# Patient Record
Sex: Male | Born: 1935 | Race: White | Hispanic: No | Marital: Married | Smoking: Former smoker
Health system: Southern US, Community
[De-identification: ages and names within clinical notes are randomized; demographics above are authoritative.]

## PROBLEM LIST (undated history)

## (undated) DIAGNOSIS — I509 Heart failure, unspecified: Secondary | ICD-10-CM

## (undated) DIAGNOSIS — J449 Chronic obstructive pulmonary disease, unspecified: Secondary | ICD-10-CM

## (undated) DIAGNOSIS — E7800 Pure hypercholesterolemia, unspecified: Secondary | ICD-10-CM

## (undated) DIAGNOSIS — Z87898 Personal history of other specified conditions: Secondary | ICD-10-CM

## (undated) DIAGNOSIS — I10 Essential (primary) hypertension: Secondary | ICD-10-CM

## (undated) DIAGNOSIS — E039 Hypothyroidism, unspecified: Secondary | ICD-10-CM

## (undated) DIAGNOSIS — M1990 Unspecified osteoarthritis, unspecified site: Secondary | ICD-10-CM

## (undated) DIAGNOSIS — R600 Localized edema: Secondary | ICD-10-CM

## (undated) DIAGNOSIS — Z9889 Other specified postprocedural states: Secondary | ICD-10-CM

## (undated) DIAGNOSIS — R002 Palpitations: Secondary | ICD-10-CM

## (undated) DIAGNOSIS — Z889 Allergy status to unspecified drugs, medicaments and biological substances status: Secondary | ICD-10-CM

## (undated) DIAGNOSIS — J189 Pneumonia, unspecified organism: Secondary | ICD-10-CM

## (undated) DIAGNOSIS — I739 Peripheral vascular disease, unspecified: Secondary | ICD-10-CM

## (undated) DIAGNOSIS — H9190 Unspecified hearing loss, unspecified ear: Secondary | ICD-10-CM

## (undated) DIAGNOSIS — J45909 Unspecified asthma, uncomplicated: Secondary | ICD-10-CM

## (undated) DIAGNOSIS — G4730 Sleep apnea, unspecified: Secondary | ICD-10-CM

## (undated) DIAGNOSIS — Z8639 Personal history of other endocrine, nutritional and metabolic disease: Secondary | ICD-10-CM

## (undated) DIAGNOSIS — K219 Gastro-esophageal reflux disease without esophagitis: Secondary | ICD-10-CM

## (undated) DIAGNOSIS — R112 Nausea with vomiting, unspecified: Secondary | ICD-10-CM

## (undated) DIAGNOSIS — E119 Type 2 diabetes mellitus without complications: Secondary | ICD-10-CM

## (undated) DIAGNOSIS — IMO0001 Reserved for inherently not codable concepts without codable children: Secondary | ICD-10-CM

## (undated) HISTORY — PX: TONSILLECTOMY: SUR1361

## (undated) HISTORY — PX: OTHER SURGICAL HISTORY: SHX169

## (undated) HISTORY — PX: CARDIAC CATHETERIZATION: SHX172

## (undated) HISTORY — PX: CHOLECYSTECTOMY: SHX55

## (undated) HISTORY — PX: EYE SURGERY: SHX253

---

## 1997-07-08 ENCOUNTER — Encounter: Payer: Self-pay | Admitting: Family Medicine

## 1997-07-08 LAB — CONVERTED CEMR LAB: PSA: 0.5 ng/mL

## 1997-07-16 ENCOUNTER — Encounter: Payer: Self-pay | Admitting: Family Medicine

## 1997-07-16 LAB — CONVERTED CEMR LAB: PSA: 0.5 ng/mL

## 1998-07-08 ENCOUNTER — Encounter: Payer: Self-pay | Admitting: Family Medicine

## 1998-07-08 LAB — CONVERTED CEMR LAB: PSA: 0.5 ng/mL

## 1998-07-22 ENCOUNTER — Encounter: Payer: Self-pay | Admitting: Family Medicine

## 1998-07-22 LAB — CONVERTED CEMR LAB: PSA: 0.5 ng/mL

## 1999-01-09 ENCOUNTER — Encounter: Payer: Self-pay | Admitting: Family Medicine

## 1999-01-09 LAB — CONVERTED CEMR LAB
Hgb A1c MFr Bld: 5.5 %
Microalbumin U total vol: 30 mg/L

## 1999-12-06 ENCOUNTER — Encounter: Payer: Self-pay | Admitting: Family Medicine

## 1999-12-06 LAB — CONVERTED CEMR LAB: PSA: 0.5 ng/mL

## 1999-12-21 ENCOUNTER — Encounter: Payer: Self-pay | Admitting: Family Medicine

## 1999-12-23 ENCOUNTER — Encounter: Payer: Self-pay | Admitting: Family Medicine

## 1999-12-23 LAB — CONVERTED CEMR LAB
Blood Glucose, Fasting: 117 mg/dL
Microalbumin U total vol: 19.1 mg/L
TSH: 1.08 microintl units/mL

## 2000-01-17 ENCOUNTER — Encounter: Payer: Self-pay | Admitting: Emergency Medicine

## 2000-01-17 ENCOUNTER — Emergency Department (HOSPITAL_COMMUNITY): Admission: EM | Admit: 2000-01-17 | Discharge: 2000-01-17 | Payer: Self-pay | Admitting: Emergency Medicine

## 2000-06-28 ENCOUNTER — Encounter: Payer: Self-pay | Admitting: Family Medicine

## 2000-06-28 LAB — CONVERTED CEMR LAB: Hgb A1c MFr Bld: 5.6 %

## 2001-04-07 ENCOUNTER — Encounter: Payer: Self-pay | Admitting: Family Medicine

## 2001-04-07 LAB — CONVERTED CEMR LAB: PSA: 0.5 ng/mL

## 2001-05-01 ENCOUNTER — Encounter: Payer: Self-pay | Admitting: Family Medicine

## 2001-05-01 LAB — CONVERTED CEMR LAB
Blood Glucose, Fasting: 146 mg/dL
TSH: 0.817 microintl units/mL

## 2001-05-02 ENCOUNTER — Encounter: Payer: Self-pay | Admitting: Family Medicine

## 2001-05-02 LAB — CONVERTED CEMR LAB
Hgb A1c MFr Bld: 6 %
PSA: 0.5 ng/mL

## 2001-08-01 ENCOUNTER — Encounter: Payer: Self-pay | Admitting: Family Medicine

## 2001-12-15 ENCOUNTER — Encounter: Payer: Self-pay | Admitting: Family Medicine

## 2002-05-14 ENCOUNTER — Encounter: Payer: Self-pay | Admitting: Family Medicine

## 2002-05-14 LAB — CONVERTED CEMR LAB: Hgb A1c MFr Bld: 7.1 %

## 2002-05-16 ENCOUNTER — Encounter: Payer: Self-pay | Admitting: Family Medicine

## 2002-05-16 LAB — CONVERTED CEMR LAB
PSA: 0.4 ng/mL
PSA: 0.4 ng/mL

## 2002-08-16 ENCOUNTER — Encounter: Payer: Self-pay | Admitting: Family Medicine

## 2003-08-06 ENCOUNTER — Encounter: Payer: Self-pay | Admitting: Family Medicine

## 2003-08-06 LAB — CONVERTED CEMR LAB: PSA: 0.5 ng/mL

## 2003-08-19 ENCOUNTER — Encounter: Payer: Self-pay | Admitting: Family Medicine

## 2003-08-19 LAB — CONVERTED CEMR LAB
Blood Glucose, Fasting: 158 mg/dL
Microalbumin U total vol: 12.7 mg/L
TSH: 0.472 microintl units/mL

## 2003-08-21 ENCOUNTER — Encounter: Payer: Self-pay | Admitting: Family Medicine

## 2003-08-21 LAB — CONVERTED CEMR LAB: PSA: 0.5 ng/mL

## 2003-11-18 ENCOUNTER — Encounter: Payer: Self-pay | Admitting: Family Medicine

## 2003-11-18 LAB — CONVERTED CEMR LAB: Hgb A1c MFr Bld: 7 %

## 2004-04-17 ENCOUNTER — Encounter: Payer: Self-pay | Admitting: Family Medicine

## 2004-04-17 LAB — CONVERTED CEMR LAB: Hgb A1c MFr Bld: 7 %

## 2004-05-21 ENCOUNTER — Ambulatory Visit: Payer: Self-pay | Admitting: Family Medicine

## 2004-06-22 ENCOUNTER — Ambulatory Visit: Payer: Self-pay | Admitting: Family Medicine

## 2004-07-15 ENCOUNTER — Encounter: Payer: Self-pay | Admitting: Family Medicine

## 2004-07-15 LAB — CONVERTED CEMR LAB
Blood Glucose, Fasting: 134 mg/dL
Hgb A1c MFr Bld: 6.9 %

## 2004-08-17 ENCOUNTER — Ambulatory Visit: Payer: Self-pay | Admitting: Family Medicine

## 2004-09-22 ENCOUNTER — Ambulatory Visit: Payer: Self-pay | Admitting: Family Medicine

## 2004-10-23 ENCOUNTER — Ambulatory Visit: Payer: Self-pay | Admitting: Family Medicine

## 2005-06-03 ENCOUNTER — Ambulatory Visit: Payer: Self-pay | Admitting: Family Medicine

## 2005-06-04 ENCOUNTER — Ambulatory Visit: Payer: Self-pay | Admitting: Family Medicine

## 2005-06-09 ENCOUNTER — Encounter: Payer: Self-pay | Admitting: Family Medicine

## 2005-06-11 ENCOUNTER — Ambulatory Visit: Payer: Self-pay | Admitting: Family Medicine

## 2005-07-14 ENCOUNTER — Ambulatory Visit: Payer: Self-pay | Admitting: Family Medicine

## 2005-07-19 ENCOUNTER — Ambulatory Visit: Payer: Self-pay | Admitting: Family Medicine

## 2005-09-10 ENCOUNTER — Encounter: Payer: Self-pay | Admitting: Family Medicine

## 2005-09-10 LAB — CONVERTED CEMR LAB: Blood Glucose, Fasting: 143 mg/dL

## 2005-09-15 ENCOUNTER — Ambulatory Visit: Payer: Self-pay | Admitting: Family Medicine

## 2006-02-08 ENCOUNTER — Encounter: Payer: Self-pay | Admitting: Family Medicine

## 2006-02-08 LAB — CONVERTED CEMR LAB: Hgb A1c MFr Bld: 6.8 %

## 2006-03-21 ENCOUNTER — Encounter: Payer: Self-pay | Admitting: Family Medicine

## 2006-03-21 LAB — CONVERTED CEMR LAB
Blood Glucose, Fasting: 109 mg/dL
Microalbumin U total vol: 3.5 mg/L
PSA: 0.6 ng/mL

## 2006-03-22 ENCOUNTER — Encounter: Payer: Self-pay | Admitting: Family Medicine

## 2007-03-09 ENCOUNTER — Encounter: Payer: Self-pay | Admitting: Family Medicine

## 2007-03-09 DIAGNOSIS — I10 Essential (primary) hypertension: Secondary | ICD-10-CM | POA: Insufficient documentation

## 2007-03-09 DIAGNOSIS — E119 Type 2 diabetes mellitus without complications: Secondary | ICD-10-CM

## 2007-03-09 DIAGNOSIS — N400 Enlarged prostate without lower urinary tract symptoms: Secondary | ICD-10-CM

## 2007-03-09 DIAGNOSIS — E039 Hypothyroidism, unspecified: Secondary | ICD-10-CM | POA: Insufficient documentation

## 2007-03-09 DIAGNOSIS — E785 Hyperlipidemia, unspecified: Secondary | ICD-10-CM

## 2007-03-12 DIAGNOSIS — G609 Hereditary and idiopathic neuropathy, unspecified: Secondary | ICD-10-CM | POA: Insufficient documentation

## 2007-03-12 DIAGNOSIS — Z87891 Personal history of nicotine dependence: Secondary | ICD-10-CM

## 2007-03-12 DIAGNOSIS — N4889 Other specified disorders of penis: Secondary | ICD-10-CM | POA: Insufficient documentation

## 2007-03-12 DIAGNOSIS — E871 Hypo-osmolality and hyponatremia: Secondary | ICD-10-CM | POA: Insufficient documentation

## 2007-06-15 ENCOUNTER — Telehealth (INDEPENDENT_AMBULATORY_CARE_PROVIDER_SITE_OTHER): Payer: Self-pay | Admitting: *Deleted

## 2007-07-31 ENCOUNTER — Encounter: Payer: Self-pay | Admitting: Family Medicine

## 2008-01-05 ENCOUNTER — Ambulatory Visit: Payer: Self-pay | Admitting: Internal Medicine

## 2008-05-20 ENCOUNTER — Encounter: Payer: Self-pay | Admitting: Internal Medicine

## 2008-06-07 ENCOUNTER — Encounter: Payer: Self-pay | Admitting: Internal Medicine

## 2008-07-10 ENCOUNTER — Ambulatory Visit: Payer: Self-pay | Admitting: Endocrinology

## 2008-07-24 ENCOUNTER — Encounter: Payer: Self-pay | Admitting: Internal Medicine

## 2008-08-05 ENCOUNTER — Encounter: Payer: Self-pay | Admitting: Internal Medicine

## 2009-09-29 ENCOUNTER — Ambulatory Visit: Payer: Self-pay | Admitting: Endocrinology

## 2010-05-06 ENCOUNTER — Ambulatory Visit: Payer: Self-pay | Admitting: Endocrinology

## 2011-05-13 ENCOUNTER — Ambulatory Visit: Payer: Self-pay | Admitting: General Surgery

## 2011-05-19 ENCOUNTER — Ambulatory Visit: Payer: Self-pay | Admitting: General Surgery

## 2012-11-11 IMAGING — CR DG CHOLANGIOGRAM OPERATIVE
1 series · 1 of 1 positions shown · non-contrast
Comparison: none

REASON FOR EXAM: cholelithiasis
COMMENTS:

[[person_name]]
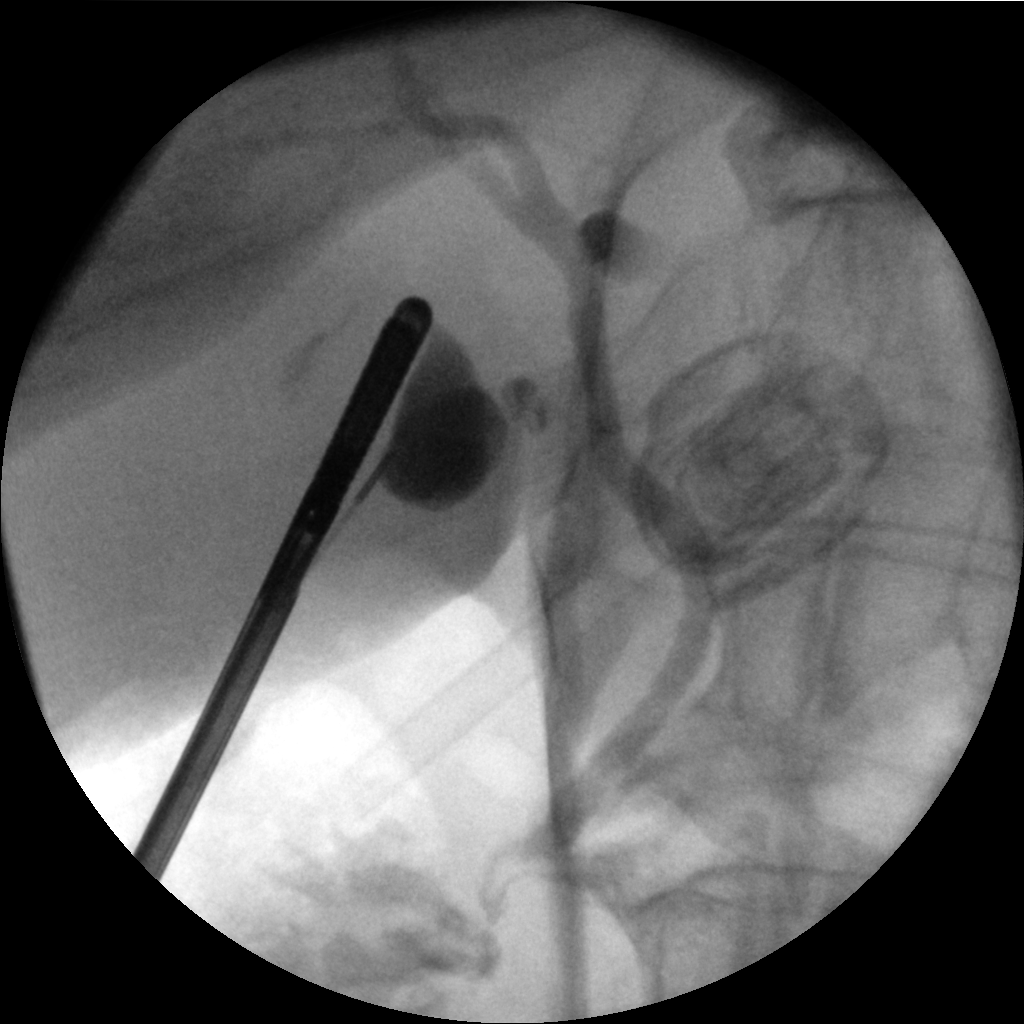

[1 of 1 positions shown; findings below may reference images not displayed]

PROCEDURE:     DXR - DXR CHOLANGIOGRAM OP (INITIAL)  - May 19, 2011  [DATE]

RESULT:

Contrast is appreciated within the common bile duct as well as within the
gallbladder. There is no evidence of contrast extravasation. Contrast
drainage into the duodenum is also identified. There does not appear to be
gross evidence of filling defects.
IMPRESSION: Intra-Operative Cholangiogram as described above.

The remainder of the interpretation will be left to the performing physician.

## 2013-08-01 ENCOUNTER — Emergency Department: Payer: Self-pay | Admitting: Emergency Medicine

## 2013-08-01 LAB — COMPREHENSIVE METABOLIC PANEL
ALBUMIN: 3.8 g/dL (ref 3.4–5.0)
ALK PHOS: 76 U/L
ALT: 17 U/L (ref 12–78)
AST: 15 U/L (ref 15–37)
Anion Gap: 3 — ABNORMAL LOW (ref 7–16)
BUN: 8 mg/dL (ref 7–18)
Bilirubin,Total: 0.7 mg/dL (ref 0.2–1.0)
CO2: 30 mmol/L (ref 21–32)
Calcium, Total: 8.3 mg/dL — ABNORMAL LOW (ref 8.5–10.1)
Chloride: 92 mmol/L — ABNORMAL LOW (ref 98–107)
Creatinine: 0.69 mg/dL (ref 0.60–1.30)
EGFR (African American): >60
GLUCOSE: 156 mg/dL — AB (ref 65–99)
OSMOLALITY: 253 (ref 275–301)
POTASSIUM: 4.4 mmol/L (ref 3.5–5.1)
SODIUM: 125 mmol/L — AB (ref 136–145)
TOTAL PROTEIN: 7.2 g/dL (ref 6.4–8.2)

## 2013-08-01 LAB — URINALYSIS, COMPLETE
BILIRUBIN, UR: NEGATIVE
BLOOD: NEGATIVE
Bacteria: NONE SEEN
GLUCOSE, UR: NEGATIVE mg/dL (ref 0–75)
Ketone: NEGATIVE
LEUKOCYTE ESTERASE: NEGATIVE
Nitrite: NEGATIVE
Ph: 7 (ref 4.5–8.0)
Protein: NEGATIVE
RBC,UR: <1 /HPF (ref 0–5)
Specific Gravity: 1.006 (ref 1.003–1.030)
Squamous Epithelial: NONE SEEN
WBC UR: <1 /HPF (ref 0–5)

## 2013-08-01 LAB — CBC WITH DIFFERENTIAL/PLATELET
BASOS ABS: 0.1 10*3/uL (ref 0.0–0.1)
BASOS PCT: 1 %
EOS PCT: 1.8 %
Eosinophil #: 0.1 10*3/uL (ref 0.0–0.7)
HCT: 43 % (ref 40.0–52.0)
HGB: 14.4 g/dL (ref 13.0–18.0)
LYMPHS PCT: 18.5 %
Lymphocyte #: 1 10*3/uL (ref 1.0–3.6)
MCH: 31.9 pg (ref 26.0–34.0)
MCHC: 33.5 g/dL (ref 32.0–36.0)
MCV: 95 fL (ref 80–100)
MONOS PCT: 9.7 %
Monocyte #: 0.6 x10 3/mm (ref 0.2–1.0)
Neutrophil #: 3.9 10*3/uL (ref 1.4–6.5)
Neutrophil %: 69 %
PLATELETS: 198 10*3/uL (ref 150–440)
RBC: 4.52 10*6/uL (ref 4.40–5.90)
RDW: 13.6 % (ref 11.5–14.5)
WBC: 5.7 10*3/uL (ref 3.8–10.6)

## 2013-08-01 LAB — TROPONIN I: Troponin-I: <0.02 ng/mL

## 2014-12-16 ENCOUNTER — Ambulatory Visit
Admission: RE | Admit: 2014-12-16 | Discharge: 2014-12-16 | Disposition: A | Discharge status: Home / Self Care | Payer: Medicare Other | Source: Ambulatory Visit | Attending: Physician Assistant | Admitting: Physician Assistant

## 2014-12-16 ENCOUNTER — Other Ambulatory Visit: Payer: Self-pay | Admitting: Physician Assistant

## 2014-12-16 DIAGNOSIS — J690 Pneumonitis due to inhalation of food and vomit: Secondary | ICD-10-CM

## 2014-12-16 DIAGNOSIS — I517 Cardiomegaly: Secondary | ICD-10-CM | POA: Diagnosis not present

## 2014-12-16 DIAGNOSIS — R918 Other nonspecific abnormal finding of lung field: Secondary | ICD-10-CM | POA: Diagnosis not present

## 2014-12-16 DIAGNOSIS — J189 Pneumonia, unspecified organism: Secondary | ICD-10-CM | POA: Diagnosis present

## 2015-08-28 ENCOUNTER — Encounter: Payer: Self-pay | Admitting: *Deleted

## 2015-08-31 NOTE — H&P (Signed)
See scanned note.

## 2015-09-01 ENCOUNTER — Ambulatory Visit: Payer: Medicare Other | Admitting: Anesthesiology

## 2015-09-01 ENCOUNTER — Encounter: Payer: Self-pay | Admitting: *Deleted

## 2015-09-01 ENCOUNTER — Encounter
Admission: RE | Disposition: A | Discharge status: Home / Self Care | Payer: Self-pay | Source: Ambulatory Visit | Attending: Ophthalmology

## 2015-09-01 ENCOUNTER — Ambulatory Visit
Admission: RE | Admit: 2015-09-01 | Discharge: 2015-09-01 | Disposition: A | Discharge status: Home / Self Care | Payer: Medicare Other | Source: Ambulatory Visit | Attending: Ophthalmology | Admitting: Ophthalmology

## 2015-09-01 DIAGNOSIS — I10 Essential (primary) hypertension: Secondary | ICD-10-CM | POA: Diagnosis not present

## 2015-09-01 DIAGNOSIS — Z9049 Acquired absence of other specified parts of digestive tract: Secondary | ICD-10-CM | POA: Insufficient documentation

## 2015-09-01 DIAGNOSIS — M1990 Unspecified osteoarthritis, unspecified site: Secondary | ICD-10-CM | POA: Insufficient documentation

## 2015-09-01 DIAGNOSIS — E119 Type 2 diabetes mellitus without complications: Secondary | ICD-10-CM | POA: Diagnosis not present

## 2015-09-01 DIAGNOSIS — G4730 Sleep apnea, unspecified: Secondary | ICD-10-CM | POA: Insufficient documentation

## 2015-09-01 DIAGNOSIS — E7800 Pure hypercholesterolemia, unspecified: Secondary | ICD-10-CM | POA: Insufficient documentation

## 2015-09-01 DIAGNOSIS — I509 Heart failure, unspecified: Secondary | ICD-10-CM | POA: Insufficient documentation

## 2015-09-01 DIAGNOSIS — H2511 Age-related nuclear cataract, right eye: Secondary | ICD-10-CM | POA: Insufficient documentation

## 2015-09-01 DIAGNOSIS — Z888 Allergy status to other drugs, medicaments and biological substances status: Secondary | ICD-10-CM | POA: Insufficient documentation

## 2015-09-01 DIAGNOSIS — H269 Unspecified cataract: Secondary | ICD-10-CM | POA: Diagnosis present

## 2015-09-01 DIAGNOSIS — Z9889 Other specified postprocedural states: Secondary | ICD-10-CM | POA: Diagnosis not present

## 2015-09-01 DIAGNOSIS — J449 Chronic obstructive pulmonary disease, unspecified: Secondary | ICD-10-CM | POA: Insufficient documentation

## 2015-09-01 DIAGNOSIS — Z87891 Personal history of nicotine dependence: Secondary | ICD-10-CM | POA: Insufficient documentation

## 2015-09-01 HISTORY — DX: Unspecified hearing loss, unspecified ear: H91.90

## 2015-09-01 HISTORY — DX: Reserved for inherently not codable concepts without codable children: IMO0001

## 2015-09-01 HISTORY — DX: Palpitations: R00.2

## 2015-09-01 HISTORY — DX: Heart failure, unspecified: I50.9

## 2015-09-01 HISTORY — DX: Peripheral vascular disease, unspecified: I73.9

## 2015-09-01 HISTORY — DX: Type 2 diabetes mellitus without complications: E11.9

## 2015-09-01 HISTORY — DX: Nausea with vomiting, unspecified: R11.2

## 2015-09-01 HISTORY — DX: Other specified postprocedural states: Z98.890

## 2015-09-01 HISTORY — DX: Unspecified asthma, uncomplicated: J45.909

## 2015-09-01 HISTORY — PX: CATARACT EXTRACTION W/PHACO: SHX586

## 2015-09-01 HISTORY — DX: Pure hypercholesterolemia, unspecified: E78.00

## 2015-09-01 HISTORY — DX: Personal history of other endocrine, nutritional and metabolic disease: Z86.39

## 2015-09-01 HISTORY — DX: Sleep apnea, unspecified: G47.30

## 2015-09-01 HISTORY — DX: Essential (primary) hypertension: I10

## 2015-09-01 HISTORY — DX: Chronic obstructive pulmonary disease, unspecified: J44.9

## 2015-09-01 HISTORY — DX: Personal history of other specified conditions: Z87.898

## 2015-09-01 HISTORY — DX: Gastro-esophageal reflux disease without esophagitis: K21.9

## 2015-09-01 HISTORY — DX: Pneumonia, unspecified organism: J18.9

## 2015-09-01 HISTORY — DX: Unspecified osteoarthritis, unspecified site: M19.90

## 2015-09-01 LAB — GLUCOSE, CAPILLARY: Glucose-Capillary: 146 mg/dL — ABNORMAL HIGH (ref 65–99)

## 2015-09-01 SURGERY — PHACOEMULSIFICATION, CATARACT, WITH IOL INSERTION
Anesthesia: Monitor Anesthesia Care | Site: Eye | Laterality: Right | Wound class: Clean

## 2015-09-01 MED ORDER — TOBRAMYCIN-DEXAMETHASONE 0.3-0.1 % OP SUSP
OPHTHALMIC | Status: DC | PRN
  Administered 2015-09-01: 1 [drp] via OPHTHALMIC

## 2015-09-01 MED ORDER — BSS IO SOLN
INTRAOCULAR | Status: DC | PRN
  Administered 2015-09-01: 1 mL via OPHTHALMIC

## 2015-09-01 MED ORDER — SODIUM CHLORIDE 0.9 % IV SOLN
INTRAVENOUS | Status: DC
  Administered 2015-09-01 (×2): via INTRAVENOUS

## 2015-09-01 MED ORDER — BUPIVACAINE HCL (PF) 0.75 % IJ SOLN
INTRAMUSCULAR | Status: AC
  Filled 2015-09-01: qty 10

## 2015-09-01 MED ORDER — POVIDONE-IODINE 5 % OP SOLN
OPHTHALMIC | Status: DC | PRN
  Administered 2015-09-01: 1 via OPHTHALMIC

## 2015-09-01 MED ORDER — LIDOCAINE HCL (PF) 4 % IJ SOLN
INTRAMUSCULAR | Status: DC | PRN
  Administered 2015-09-01: 4 mL via OPHTHALMIC

## 2015-09-01 MED ORDER — CEFUROXIME OPHTHALMIC INJECTION 1 MG/0.1 ML
INJECTION | OPHTHALMIC | Status: AC
  Filled 2015-09-01: qty 0.1

## 2015-09-01 MED ORDER — POVIDONE-IODINE 5 % OP SOLN
OPHTHALMIC | Status: AC
  Filled 2015-09-01: qty 30

## 2015-09-01 MED ORDER — TETRACAINE HCL 0.5 % OP SOLN
OPHTHALMIC | Status: AC
  Filled 2015-09-01: qty 2

## 2015-09-01 MED ORDER — EPINEPHRINE HCL 1 MG/ML IJ SOLN
INTRAMUSCULAR | Status: AC
  Filled 2015-09-01: qty 2

## 2015-09-01 MED ORDER — ALFENTANIL 500 MCG/ML IJ INJ
INJECTION | INTRAMUSCULAR | Status: DC | PRN
  Administered 2015-09-01: 750 ug via INTRAVENOUS

## 2015-09-01 MED ORDER — NA CHONDROIT SULF-NA HYALURON 40-17 MG/ML IO SOLN
INTRAOCULAR | Status: DC | PRN
  Administered 2015-09-01: 1 mL via INTRAOCULAR

## 2015-09-01 MED ORDER — TOBRAMYCIN-DEXAMETHASONE 0.3-0.1 % OP OINT
TOPICAL_OINTMENT | OPHTHALMIC | Status: AC
  Filled 2015-09-01: qty 3.5

## 2015-09-01 MED ORDER — NA CHONDROIT SULF-NA HYALURON 40-17 MG/ML IO SOLN
INTRAOCULAR | Status: AC
  Filled 2015-09-01: qty 1

## 2015-09-01 MED ORDER — LIDOCAINE HCL (PF) 4 % IJ SOLN
INTRAOCULAR | Status: DC | PRN
  Administered 2015-09-01: .5 mL via OPHTHALMIC

## 2015-09-01 MED ORDER — PHENYLEPHRINE HCL 10 % OP SOLN
OPHTHALMIC | Status: AC
  Administered 2015-09-01: 1 [drp] via OPHTHALMIC
  Filled 2015-09-01: qty 5

## 2015-09-01 MED ORDER — CYCLOPENTOLATE HCL 2 % OP SOLN
OPHTHALMIC | Status: AC
  Administered 2015-09-01: 1 [drp] via OPHTHALMIC
  Filled 2015-09-01: qty 2

## 2015-09-01 MED ORDER — HYALURONIDASE HUMAN 150 UNIT/ML IJ SOLN
INTRAMUSCULAR | Status: AC
  Filled 2015-09-01: qty 1

## 2015-09-01 MED ORDER — MOXIFLOXACIN HCL 0.5 % OP SOLN
1.0000 [drp] | OPHTHALMIC | Status: AC
  Administered 2015-09-01 (×3): 1 [drp] via OPHTHALMIC

## 2015-09-01 MED ORDER — CARBACHOL 0.01 % IO SOLN
INTRAOCULAR | Status: DC | PRN
  Administered 2015-09-01: .5 mL via INTRAOCULAR

## 2015-09-01 MED ORDER — PHENYLEPHRINE HCL 10 % OP SOLN
1.0000 [drp] | OPHTHALMIC | Status: AC
  Administered 2015-09-01 (×4): 1 [drp] via OPHTHALMIC

## 2015-09-01 MED ORDER — MOXIFLOXACIN HCL 0.5 % OP SOLN
OPHTHALMIC | Status: AC
  Administered 2015-09-01: 1 [drp] via OPHTHALMIC
  Filled 2015-09-01: qty 3

## 2015-09-01 MED ORDER — ONDANSETRON HCL 4 MG/2ML IJ SOLN
INTRAMUSCULAR | Status: DC | PRN
  Administered 2015-09-01: 4 mg via INTRAVENOUS

## 2015-09-01 MED ORDER — MOXIFLOXACIN HCL 0.5 % OP SOLN
OPHTHALMIC | Status: DC | PRN
  Administered 2015-09-01: 1 [drp] via OPHTHALMIC

## 2015-09-01 MED ORDER — TETRACAINE HCL 0.5 % OP SOLN
OPHTHALMIC | Status: DC | PRN
  Administered 2015-09-01: 1 [drp] via OPHTHALMIC

## 2015-09-01 MED ORDER — LIDOCAINE HCL (PF) 4 % IJ SOLN
INTRAMUSCULAR | Status: AC
  Filled 2015-09-01: qty 5

## 2015-09-01 MED ORDER — MIDAZOLAM HCL 2 MG/2ML IJ SOLN
INTRAMUSCULAR | Status: DC | PRN
  Administered 2015-09-01: 1 mg via INTRAVENOUS

## 2015-09-01 MED ORDER — CYCLOPENTOLATE HCL 2 % OP SOLN
1.0000 [drp] | OPHTHALMIC | Status: AC
  Administered 2015-09-01 (×4): 1 [drp] via OPHTHALMIC

## 2015-09-01 SURGICAL SUPPLY — 30 items
CANNULA ANT/CHMB 27GA (MISCELLANEOUS) ×9 IMPLANT
CORD BIP STRL DISP 12FT (MISCELLANEOUS) ×3 IMPLANT
CUP MEDICINE 2OZ PLAST GRAD ST (MISCELLANEOUS) ×7 IMPLANT
DRAPE XRAY CASSETTE 23X24 (DRAPES) ×3 IMPLANT
ERASER HMR WETFIELD 18G (MISCELLANEOUS) ×3 IMPLANT
GLOVE BIO SURGEON STRL SZ8 (GLOVE) ×3 IMPLANT
GLOVE SURG LX 6.5 MICRO (GLOVE) ×2
GLOVE SURG LX 8.0 MICRO (GLOVE) ×2
GLOVE SURG LX STRL 6.5 MICRO (GLOVE) ×1 IMPLANT
GLOVE SURG LX STRL 8.0 MICRO (GLOVE) ×1 IMPLANT
GOWN STRL REUS W/ TWL LRG LVL3 (GOWN DISPOSABLE) ×1 IMPLANT
GOWN STRL REUS W/ TWL XL LVL3 (GOWN DISPOSABLE) ×1 IMPLANT
GOWN STRL REUS W/TWL LRG LVL3 (GOWN DISPOSABLE) ×3
GOWN STRL REUS W/TWL XL LVL3 (GOWN DISPOSABLE) ×3
LENS IOL ACRSF IQ ULTRA 19.0 (Intraocular Lens) IMPLANT
LENS IOL ACRYSOF IQ 19.0 (Intraocular Lens) ×3 IMPLANT
PACK CATARACT (MISCELLANEOUS) ×3 IMPLANT
PACK CATARACT DINGLEDEIN LX (MISCELLANEOUS) ×3 IMPLANT
PACK EYE AFTER SURG (MISCELLANEOUS) ×3 IMPLANT
SHLD EYE VISITEC  UNIV (MISCELLANEOUS) ×3 IMPLANT
SOL BSS BAG (MISCELLANEOUS) ×3
SOL PREP PVP 2OZ (MISCELLANEOUS) ×3
SOLUTION BSS BAG (MISCELLANEOUS) ×1 IMPLANT
SOLUTION PREP PVP 2OZ (MISCELLANEOUS) ×1 IMPLANT
SUT SILK 5-0 (SUTURE) ×3 IMPLANT
SYR 3ML LL SCALE MARK (SYRINGE) ×3 IMPLANT
SYR 5ML LL (SYRINGE) ×3 IMPLANT
SYR TB 1ML 27GX1/2 LL (SYRINGE) ×7 IMPLANT
WATER STERILE IRR 1000ML POUR (IV SOLUTION) ×3 IMPLANT
WIPE NON LINTING 3.25X3.25 (MISCELLANEOUS) ×3 IMPLANT

## 2015-09-01 NOTE — Op Note (Signed)
Date of Surgery: 09/01/2015 Date of Dictation: 09/01/2015 11:10 AM Pre-operative Diagnosis:  Nuclear Sclerotic Cataract right Eye Post-operative Diagnosis: same Procedure performed: Extra-capsular Cataract Extraction (ECCE) with placement of a posterior chamber intraocular lens (IOL) right Eye IOL:  Implant Name Type Inv. Item Serial No. Manufacturer Lot No. LRB No. Used  LENS IOL ACRYSOF IQ 19.0 - [id number] Intraocular Lens LENS IOL ACRYSOF IQ 19.0 [id number] ALCON   Right 1   Anesthesia: 2% Lidocaine and 4% Marcaine in a 50/50 mixture with 10 unites/ml of Hylenex given as a peribulbar Anesthesiologist: Anesthesiologist: Yves DillPaul Carroll, MD CRNA: Henrietta HooverKimberly Pope, CRNA Complications: none Estimated Blood Loss: less than 1 ml  Description of procedure:  The patient was given anesthesia and sedation via intravenous access. The patient was then prepped and draped in the usual fashion. A 25-gauge needle was bent for initiating the capsulorhexis. A 5-0 silk suture was placed through the conjunctiva superior and inferiorly to serve as bridle sutures. Hemostasis was obtained at the superior limbus using an eraser cautery. A partial thickness groove was made at the anterior surgical limbus with a 64 Beaver blade and this was dissected anteriorly with an SYSCOlcon Crescent knife. The anterior chamber was entered at 10 o'clock with a 1.0 mm paracentesis knife and through the lamellar dissection with a 2.6 mm Alcon keratome. Epi-Shugarcaine 0.5 CC [9 cc BSS Plus (Alcon), 3 cc 4% preservative-free lidocaine (Hospira) and 4 cc 1:1000 preservative-free, bisulfite-free epinephrine] was injected into the anterior chamber via the paracentesis tract. Epi-Shugarcaine 0.5 CC [9 cc BSS Plus (Alcon), 3 cc 4% preservative-free lidocaine (Hospira) and 4 cc 1:1000 preservative-free, bisulfite-free epinephrine] was injected into the anterior chamber via the paracentesis tract. DiscoVisc was injected to replace the aqueous and a  continuous tear curvilinear capsulorhexis was performed using a bent 25-gauge needle.  Balance salt on a syringe was used to perform hydro-dissection and phacoemulsification was carried out using a divide and conquer technique. Procedure(s) with comments: CATARACT EXTRACTION PHACO AND INTRAOCULAR LENS PLACEMENT (IOC) (Right) - US 01:33 AP% 24.9 CDE 42.99 fluid pack lot # [id number]. Irrigation/aspiration was used to remove the residual cortex and the capsular bag was inflated with DiscoVisc. The intraocular lens was inserted into the capsular bag using a pre-loaded UltraSert Delivery System. Irrigation/aspiration was used to remove the residual DiscoVisc. The wound was inflated with balanced salt and checked for leaks. None were found. Miostat was injected via the paracentesis track and 0.1 ml of Vigamox containing 1 mg of drug  was injected via the paracentesis track. The wound was checked for leaks again and none were found.   The bridal sutures were removed and two drops of Vigamox were placed on the eye. 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 An eye shield was placed to protect the eye and the patient was discharged to the recovery area in good condition.   Bonnee Zertuche MD

## 2015-09-01 NOTE — Transfer of Care (Signed)
Immediate Anesthesia Transfer of Care Note  Patient: Aaron JensenJohn L [name]  Procedure(s) Performed: Procedure(s) with comments: CATARACT EXTRACTION PHACO AND INTRAOCULAR LENS PLACEMENT (IOC) (Right) - US 01:33 AP% 24.9 CDE 42.99 fluid pack lot # [id number]  Patient Location: PACU  Anesthesia Type:MAC  Level of Consciousness: awake  Airway & Oxygen Therapy: Patient Spontanous Breathing  Post-op Assessment: Report given to RN and Post -op Vital signs reviewed and stable  Post vital signs: Reviewed and stable  Last Vitals:  Filed Vitals:   09/01/15 0859 09/01/15 1114  BP: 154/81 130/59  Pulse: 78 69  Temp: 36.3 C 37 C  Resp: 18 16    Complications: No apparent anesthesia complications

## 2015-09-01 NOTE — Anesthesia Preprocedure Evaluation (Addendum)
Anesthesia Evaluation  Patient identified by MRN, date of birth, ID band Patient awake    Reviewed: Allergy & Precautions, NPO status , Patient's Chart, lab work & pertinent test results, reviewed documented beta blocker date and time   History of Anesthesia Complications (+) PONV and history of anesthetic complications  Airway Mallampati: II  TM Distance: >3 FB Neck ROM: Full   Comment: Large neck Dental  (+) Upper Dentures, Lower Dentures   Pulmonary shortness of breath and with exertion, asthma , sleep apnea and Continuous Positive Airway Pressure Ventilation , pneumonia, resolved, COPD, former smoker,   Per usual end- expiratory wheezes   + wheezing      Cardiovascular hypertension, Pt. on medications and Pt. on home beta blockers + Peripheral Vascular Disease and +CHF  Normal cardiovascular exam     Neuro/Psych Peripheral neuropathy  Neuromuscular disease negative psych ROS   GI/Hepatic GERD  Medicated and Controlled,  Endo/Other  diabetes, Well Controlled, Type 2, Oral Hypoglycemic AgentsHypothyroidism   Renal/GU   negative genitourinary   Musculoskeletal  (+) Arthritis , Osteoarthritis,    Abdominal Normal abdominal exam  (+)   Peds negative pediatric ROS (+)  Hematology negative hematology ROS (+)   Anesthesia Other Findings   Reproductive/Obstetrics                            Anesthesia Physical Anesthesia Plan  ASA: III  Anesthesia Plan: General   Post-op Pain Management:    Induction: Intravenous  Airway Management Planned: Nasal Cannula  Additional Equipment:   Intra-op Plan:   Post-operative Plan:   Informed Consent: I have reviewed the patients History and Physical, chart, labs and discussed the procedure including the risks, benefits and alternatives for the proposed anesthesia with the patient or authorized representative who has indicated his/her understanding  and acceptance.   Dental advisory given  Plan Discussed with: CRNA and Surgeon  Anesthesia Plan Comments:         Anesthesia Quick Evaluation

## 2015-09-01 NOTE — Anesthesia Postprocedure Evaluation (Signed)
Anesthesia Post Note  Patient: Aaron JensenJohn L [name]  Procedure(s) Performed: Procedure(s) (LRB): CATARACT EXTRACTION PHACO AND INTRAOCULAR LENS PLACEMENT (IOC) (Right)  Patient location during evaluation: Short Stay Anesthesia Type: MAC Level of consciousness: awake Pain management: pain level controlled Vital Signs Assessment: post-procedure vital signs reviewed and stable Respiratory status: spontaneous breathing Cardiovascular status: stable and blood pressure returned to baseline Postop Assessment: no headache Anesthetic complications: no    Last Vitals:  Filed Vitals:   09/01/15 0859 09/01/15 1114  BP: 154/81 130/59  Pulse: 78 69  Temp: 36.3 C 37 C  Resp: 18 16    Last Pain: There were no vitals filed for this visit.               Vernie MurdersPope,  Dorothe Elmore G

## 2015-09-01 NOTE — Interval H&P Note (Signed)
History and Physical Interval Note:  09/01/2015 10:24 AM  Aaron JensenJohn L [name]  has presented today for surgery, with the diagnosis of CATARACT  The various methods of treatment have been discussed with the patient and family. After consideration of risks, benefits and other options for treatment, the patient has consented to  Procedure(s): CATARACT EXTRACTION PHACO AND INTRAOCULAR LENS PLACEMENT (IOC) (Right) as a surgical intervention .  The patient's history has been reviewed, patient examined, no change in status, stable for surgery.  I have reviewed the patient's chart and labs.  Questions were answered to the patient's satisfaction.     Braxton Vantrease

## 2015-09-01 NOTE — Discharge Instructions (Addendum)
See handout. Eye Surgery Discharge Instructions  Expect mild scratchy sensation or mild soreness. DO NOT RUB YOUR EYE!  The day of surgery:  Minimal physical activity, but bed rest is not required  No reading, computer work, or close hand work  No bending, lifting, or straining.  May watch TV  For 24 hours:  No driving, legal decisions, or alcoholic beverages  Safety precautions  Eat anything you prefer: It is better to start with liquids, then soup then solid foods.  _____ Eye patch should be worn until postoperative exam tomorrow.  ____ Solar shield eyeglasses should be worn for comfort in the sunlight/patch while sleeping  Resume all regular medications including aspirin or Coumadin if these were discontinued prior to surgery. You may shower, bathe, shave, or wash your hair. Tylenol may be taken for mild discomfort.  Call your doctor if you experience significant pain, nausea, or vomiting, fever > 101 or other signs of infection. [phone] or [phone] Specific instructions:  Follow-up Information    Follow up with Sallee LangeINGELDEIN,STEVEN, MD.   Specialty:  Ophthalmology   Why:  09-02-15 at 10:40   Contact information:   [address]   [place] [address] [phone]      Eye Surgery Discharge Instructions  Expect mild scratchy sensation or mild soreness. DO NOT RUB YOUR EYE!  The day of surgery:  Minimal physical activity, but bed rest is not required  No reading, computer work, or close hand work  No bending, lifting, or straining.  May watch TV  For 24 hours:  No driving, legal decisions, or alcoholic beverages  Safety precautions  Eat anything you prefer: It is better to start with liquids, then soup then solid foods.  _____ Eye patch should be worn until postoperative exam tomorrow.  ____ Solar shield eyeglasses should be worn for comfort in the sunlight/patch while sleeping  Resume all regular medications including aspirin or  Coumadin if these were discontinued prior to surgery. You may shower, bathe, shave, or wash your hair. Tylenol may be taken for mild discomfort.  Call your doctor if you experience significant pain, nausea, or vomiting, fever > 101 or other signs of infection. [phone] or [phone] Specific instructions:  Follow-up Information    Follow up with Sallee LangeINGELDEIN,STEVEN, MD.   Specialty:  Ophthalmology   Why:  09-02-15 at 10:40   Contact information:   [address]   [place] [address] [phone]

## 2015-09-01 NOTE — Anesthesia Procedure Notes (Signed)
Procedure Name: MAC Date/Time: 09/01/2015 10:35 AM Performed by: Henrietta HooverPOPE, Muskan Bolla Pre-anesthesia Checklist: Patient identified, Emergency Drugs available, Suction available, Patient being monitored and Timeout performed Patient Re-evaluated:Patient Re-evaluated prior to inductionOxygen Delivery Method: Nasal cannula Intubation Type: IV induction

## 2015-09-17 ENCOUNTER — Encounter: Payer: Self-pay | Admitting: *Deleted

## 2015-09-21 NOTE — H&P (Signed)
See scanned note.

## 2015-09-22 ENCOUNTER — Ambulatory Visit
Admission: RE | Admit: 2015-09-22 | Discharge: 2015-09-22 | Disposition: A | Discharge status: Home / Self Care | Payer: Medicare Other | Source: Ambulatory Visit | Attending: Ophthalmology | Admitting: Ophthalmology

## 2015-09-22 ENCOUNTER — Encounter: Payer: Self-pay | Admitting: *Deleted

## 2015-09-22 ENCOUNTER — Ambulatory Visit: Payer: Medicare Other | Admitting: Anesthesiology

## 2015-09-22 ENCOUNTER — Encounter
Admission: RE | Disposition: A | Discharge status: Home / Self Care | Payer: Self-pay | Source: Ambulatory Visit | Attending: Ophthalmology

## 2015-09-22 DIAGNOSIS — Z79899 Other long term (current) drug therapy: Secondary | ICD-10-CM | POA: Insufficient documentation

## 2015-09-22 DIAGNOSIS — E119 Type 2 diabetes mellitus without complications: Secondary | ICD-10-CM | POA: Diagnosis not present

## 2015-09-22 DIAGNOSIS — Z9109 Other allergy status, other than to drugs and biological substances: Secondary | ICD-10-CM | POA: Insufficient documentation

## 2015-09-22 DIAGNOSIS — H2512 Age-related nuclear cataract, left eye: Secondary | ICD-10-CM | POA: Diagnosis not present

## 2015-09-22 DIAGNOSIS — J449 Chronic obstructive pulmonary disease, unspecified: Secondary | ICD-10-CM | POA: Insufficient documentation

## 2015-09-22 DIAGNOSIS — Z87891 Personal history of nicotine dependence: Secondary | ICD-10-CM | POA: Diagnosis not present

## 2015-09-22 DIAGNOSIS — I110 Hypertensive heart disease with heart failure: Secondary | ICD-10-CM | POA: Insufficient documentation

## 2015-09-22 DIAGNOSIS — Z9049 Acquired absence of other specified parts of digestive tract: Secondary | ICD-10-CM | POA: Insufficient documentation

## 2015-09-22 DIAGNOSIS — E079 Disorder of thyroid, unspecified: Secondary | ICD-10-CM | POA: Insufficient documentation

## 2015-09-22 DIAGNOSIS — I509 Heart failure, unspecified: Secondary | ICD-10-CM | POA: Diagnosis not present

## 2015-09-22 DIAGNOSIS — Z9889 Other specified postprocedural states: Secondary | ICD-10-CM | POA: Insufficient documentation

## 2015-09-22 DIAGNOSIS — M1990 Unspecified osteoarthritis, unspecified site: Secondary | ICD-10-CM | POA: Diagnosis not present

## 2015-09-22 DIAGNOSIS — Z7982 Long term (current) use of aspirin: Secondary | ICD-10-CM | POA: Insufficient documentation

## 2015-09-22 DIAGNOSIS — Z7984 Long term (current) use of oral hypoglycemic drugs: Secondary | ICD-10-CM | POA: Insufficient documentation

## 2015-09-22 DIAGNOSIS — E7800 Pure hypercholesterolemia, unspecified: Secondary | ICD-10-CM | POA: Insufficient documentation

## 2015-09-22 DIAGNOSIS — H269 Unspecified cataract: Secondary | ICD-10-CM | POA: Diagnosis present

## 2015-09-22 DIAGNOSIS — G4730 Sleep apnea, unspecified: Secondary | ICD-10-CM | POA: Insufficient documentation

## 2015-09-22 DIAGNOSIS — Z9849 Cataract extraction status, unspecified eye: Secondary | ICD-10-CM | POA: Insufficient documentation

## 2015-09-22 HISTORY — PX: CATARACT EXTRACTION W/PHACO: SHX586

## 2015-09-22 HISTORY — DX: Hypothyroidism, unspecified: E03.9

## 2015-09-22 HISTORY — DX: Allergy status to unspecified drugs, medicaments and biological substances: Z88.9

## 2015-09-22 HISTORY — DX: Localized edema: R60.0

## 2015-09-22 LAB — GLUCOSE, CAPILLARY: Glucose-Capillary: 140 mg/dL — ABNORMAL HIGH (ref 65–99)

## 2015-09-22 SURGERY — PHACOEMULSIFICATION, CATARACT, WITH IOL INSERTION
Anesthesia: Monitor Anesthesia Care | Site: Eye | Laterality: Left | Wound class: Clean

## 2015-09-22 MED ORDER — NA CHONDROIT SULF-NA HYALURON 40-17 MG/ML IO SOLN
INTRAOCULAR | Status: DC | PRN
  Administered 2015-09-22: 1 mL via INTRAOCULAR

## 2015-09-22 MED ORDER — BUPIVACAINE HCL (PF) 0.75 % IJ SOLN
INTRAMUSCULAR | Status: AC
  Filled 2015-09-22: qty 10

## 2015-09-22 MED ORDER — ALFENTANIL 500 MCG/ML IJ INJ
INJECTION | INTRAMUSCULAR | Status: DC | PRN
  Administered 2015-09-22: 500 ug via INTRAVENOUS

## 2015-09-22 MED ORDER — EPINEPHRINE HCL 1 MG/ML IJ SOLN
INTRAMUSCULAR | Status: AC
  Filled 2015-09-22: qty 2

## 2015-09-22 MED ORDER — CYCLOPENTOLATE HCL 2 % OP SOLN
OPHTHALMIC | Status: AC
  Filled 2015-09-22: qty 2

## 2015-09-22 MED ORDER — CARBACHOL 0.01 % IO SOLN
INTRAOCULAR | Status: DC | PRN
  Administered 2015-09-22: .5 mL via INTRAOCULAR

## 2015-09-22 MED ORDER — NA CHONDROIT SULF-NA HYALURON 40-17 MG/ML IO SOLN
INTRAOCULAR | Status: AC
  Filled 2015-09-22: qty 1

## 2015-09-22 MED ORDER — LIDOCAINE HCL (PF) 4 % IJ SOLN
INTRAOCULAR | Status: DC | PRN
  Administered 2015-09-22: .5 mL via OPHTHALMIC

## 2015-09-22 MED ORDER — PHENYLEPHRINE HCL 10 % OP SOLN
OPHTHALMIC | Status: AC
  Filled 2015-09-22: qty 5

## 2015-09-22 MED ORDER — CEFUROXIME OPHTHALMIC INJECTION 1 MG/0.1 ML: INJECTION | OPHTHALMIC | Status: DC | PRN

## 2015-09-22 MED ORDER — POVIDONE-IODINE 5 % OP SOLN
OPHTHALMIC | Status: DC | PRN
  Administered 2015-09-22: 1 via OPHTHALMIC

## 2015-09-22 MED ORDER — POVIDONE-IODINE 5 % OP SOLN
OPHTHALMIC | Status: AC
  Filled 2015-09-22: qty 30

## 2015-09-22 MED ORDER — MOXIFLOXACIN HCL 0.5 % OP SOLN
OPHTHALMIC | Status: AC
  Filled 2015-09-22: qty 3

## 2015-09-22 MED ORDER — LIDOCAINE HCL (PF) 4 % IJ SOLN
INTRAMUSCULAR | Status: AC
  Filled 2015-09-22: qty 5

## 2015-09-22 MED ORDER — LIDOCAINE HCL (PF) 4 % IJ SOLN
INTRAMUSCULAR | Status: DC | PRN
  Administered 2015-09-22: 4 mL via OPHTHALMIC

## 2015-09-22 MED ORDER — SODIUM CHLORIDE 0.9 % IV SOLN
INTRAVENOUS | Status: DC
  Administered 2015-09-22: 1 mL via INTRAVENOUS

## 2015-09-22 MED ORDER — PHENYLEPHRINE HCL 10 % OP SOLN
1.0000 [drp] | OPHTHALMIC | Status: AC
  Administered 2015-09-22 (×4): 1 [drp] via OPHTHALMIC

## 2015-09-22 MED ORDER — HYALURONIDASE HUMAN 150 UNIT/ML IJ SOLN
INTRAMUSCULAR | Status: AC
  Filled 2015-09-22: qty 1

## 2015-09-22 MED ORDER — MOXIFLOXACIN HCL 0.5 % OP SOLN
1.0000 [drp] | OPHTHALMIC | Status: AC
  Administered 2015-09-22 (×3): 1 [drp] via OPHTHALMIC

## 2015-09-22 MED ORDER — CYCLOPENTOLATE HCL 2 % OP SOLN
1.0000 [drp] | OPHTHALMIC | Status: AC
  Administered 2015-09-22 (×4): 1 [drp] via OPHTHALMIC

## 2015-09-22 MED ORDER — EPINEPHRINE HCL 1 MG/ML IJ SOLN
INTRAOCULAR | Status: DC | PRN
  Administered 2015-09-22: 1 mL via OPHTHALMIC

## 2015-09-22 MED ORDER — MOXIFLOXACIN HCL 0.5 % OP SOLN
OPHTHALMIC | Status: DC | PRN
  Administered 2015-09-22: 1 [drp] via OPHTHALMIC

## 2015-09-22 MED ORDER — CEFUROXIME OPHTHALMIC INJECTION 1 MG/0.1 ML
INJECTION | OPHTHALMIC | Status: AC
  Filled 2015-09-22: qty 0.1

## 2015-09-22 MED ORDER — TETRACAINE HCL 0.5 % OP SOLN
OPHTHALMIC | Status: AC
  Filled 2015-09-22: qty 2

## 2015-09-22 MED ORDER — TETRACAINE HCL 0.5 % OP SOLN
OPHTHALMIC | Status: DC | PRN
Start: 2015-09-22 — End: 2015-09-22
  Administered 2015-09-22: 1 [drp] via OPHTHALMIC

## 2015-09-22 SURGICAL SUPPLY — 30 items
CANNULA ANT/CHMB 27GA (MISCELLANEOUS) ×3 IMPLANT
CORD BIP STRL DISP 12FT (MISCELLANEOUS) ×3 IMPLANT
CUP MEDICINE 2OZ PLAST GRAD ST (MISCELLANEOUS) ×3 IMPLANT
DRAPE XRAY CASSETTE 23X24 (DRAPES) ×3 IMPLANT
ERASER HMR WETFIELD 18G (MISCELLANEOUS) ×3 IMPLANT
GLOVE BIO SURGEON STRL SZ8 (GLOVE) ×3 IMPLANT
GLOVE SURG LX 6.5 MICRO (GLOVE) ×2
GLOVE SURG LX 8.0 MICRO (GLOVE) ×2
GLOVE SURG LX STRL 6.5 MICRO (GLOVE) ×1 IMPLANT
GLOVE SURG LX STRL 8.0 MICRO (GLOVE) ×1 IMPLANT
GOWN STRL REUS W/ TWL LRG LVL3 (GOWN DISPOSABLE) ×1 IMPLANT
GOWN STRL REUS W/ TWL XL LVL3 (GOWN DISPOSABLE) ×1 IMPLANT
GOWN STRL REUS W/TWL LRG LVL3 (GOWN DISPOSABLE) ×3
GOWN STRL REUS W/TWL XL LVL3 (GOWN DISPOSABLE) ×3
LENS IOL ACRSF IQ ULTRA 19.0 (Intraocular Lens) IMPLANT
LENS IOL ACRYSOF IQ 19.0 (Intraocular Lens) ×3 IMPLANT
PACK CATARACT (MISCELLANEOUS) ×3 IMPLANT
PACK CATARACT DINGLEDEIN LX (MISCELLANEOUS) ×3 IMPLANT
PACK EYE AFTER SURG (MISCELLANEOUS) ×3 IMPLANT
SHLD EYE VISITEC  UNIV (MISCELLANEOUS) ×3 IMPLANT
SOL BSS BAG (MISCELLANEOUS) ×3
SOL PREP PVP 2OZ (MISCELLANEOUS) ×3
SOLUTION BSS BAG (MISCELLANEOUS) ×1 IMPLANT
SOLUTION PREP PVP 2OZ (MISCELLANEOUS) ×1 IMPLANT
SUT SILK 5-0 (SUTURE) ×3 IMPLANT
SYR 3ML LL SCALE MARK (SYRINGE) ×3 IMPLANT
SYR 5ML LL (SYRINGE) ×3 IMPLANT
SYR TB 1ML 27GX1/2 LL (SYRINGE) ×3 IMPLANT
WATER STERILE IRR 1000ML POUR (IV SOLUTION) ×3 IMPLANT
WIPE NON LINTING 3.25X3.25 (MISCELLANEOUS) ×3 IMPLANT

## 2015-09-22 NOTE — Transfer of Care (Signed)
Immediate Anesthesia Transfer of Care Note  Patient: Aaron JensenJohn L [name]  Procedure(s) Performed: Procedure(s) with comments: CATARACT EXTRACTION PHACO AND INTRAOCULAR LENS PLACEMENT (IOC) (Left) - US 01:28 AP% 20.9 CDE 29.54 fluid pack lot # [id number]  Patient Location: PACU  Anesthesia Type:MAC  Level of Consciousness: awake, alert , oriented and patient cooperative  Airway & Oxygen Therapy: Patient Spontanous Breathing  Post-op Assessment: Report given to RN, Post -op Vital signs reviewed and stable and Patient moving all extremities X 4  Post vital signs: Reviewed and stable  Last Vitals:  Filed Vitals:   09/17/15 1103 09/22/15 0828  BP: 127/66 158/71  Pulse: 83 68  Temp:  36.8 C  Resp:  16    Complications: No apparent anesthesia complications

## 2015-09-22 NOTE — Discharge Instructions (Signed)
Eye Surgery Discharge Instructions  Expect mild scratchy sensation or mild soreness. DO NOT RUB YOUR EYE!  The day of surgery:  Minimal physical activity, but bed rest is not required  No reading, computer work, or close hand work  No bending, lifting, or straining.  May watch TV  For 24 hours:  No driving, legal decisions, or alcoholic beverages  Safety precautions  Eat anything you prefer: It is better to start with liquids, then soup then solid foods.  _____ Eye patch should be worn until postoperative exam tomorrow.  ____ Solar shield eyeglasses should be worn for comfort in the sunlight/patch while sleeping  Resume all regular medications including aspirin or Coumadin if these were discontinued prior to surgery. You may shower, bathe, shave, or wash your hair. Tylenol may be taken for mild discomfort.  Call your doctor if you experience significant pain, nausea, or vomiting, fever > 101 or other signs of infection. [phone] or [phone] Specific instructions:  Follow-up Information    Follow up with DINGELDEIN,STEVEN, MD In 1 day.   Specialty:  Ophthalmology   Why:  For wound re-check@ 10:05   Contact information:   [address]   [place] [address] [phone]

## 2015-09-22 NOTE — Interval H&P Note (Signed)
History and Physical Interval Note:  09/22/2015 10:00 AM  Aaron JensenJohn L Ekman  has presented today for surgery, with the diagnosis of CATARACT  The various methods of treatment have been discussed with the patient and family. After consideration of risks, benefits and other options for treatment, the patient has consented to  Procedure(s): CATARACT EXTRACTION PHACO AND INTRAOCULAR LENS PLACEMENT (IOC) (Left) as a surgical intervention .  The patient's history has been reviewed, patient examined, no change in status, stable for surgery.  I have reviewed the patient's chart and labs.  Questions were answered to the patient's satisfaction.     Casmer Yepiz

## 2015-09-22 NOTE — Anesthesia Preprocedure Evaluation (Signed)
Anesthesia Evaluation  Patient identified by MRN, date of birth, ID band Patient awake    Reviewed: Allergy & Precautions, NPO status   History of Anesthesia Complications (+) PONV  Airway Mallampati: II       Dental  (+) Upper Dentures, Lower Dentures   Pulmonary sleep apnea , COPD, former smoker,     + decreased breath sounds      Cardiovascular Exercise Tolerance: Poor hypertension, Pt. on medications + Peripheral Vascular Disease and +CHF   Rhythm:Regular     Neuro/Psych    GI/Hepatic Neg liver ROS, GERD  ,  Endo/Other  diabetes, Well Controlled, Type 2Hypothyroidism   Renal/GU negative Renal ROS     Musculoskeletal   Abdominal (+) + obese,   Peds  Hematology   Anesthesia Other Findings   Reproductive/Obstetrics                             Anesthesia Physical Anesthesia Plan  ASA: III  Anesthesia Plan: MAC   Post-op Pain Management:    Induction: Intravenous  Airway Management Planned: Natural Airway and Nasal Cannula  Additional Equipment:   Intra-op Plan:   Post-operative Plan:   Informed Consent: I have reviewed the patients History and Physical, chart, labs and discussed the procedure including the risks, benefits and alternatives for the proposed anesthesia with the patient or authorized representative who has indicated his/her understanding and acceptance.     Plan Discussed with: CRNA  Anesthesia Plan Comments:         Anesthesia Quick Evaluation

## 2015-09-22 NOTE — Anesthesia Postprocedure Evaluation (Signed)
Anesthesia Post Note  Patient: Aaron JensenJohn L [name]  Procedure(s) Performed: Procedure(s) (LRB): CATARACT EXTRACTION PHACO AND INTRAOCULAR LENS PLACEMENT (IOC) (Left)  Patient location during evaluation: PACU Anesthesia Type: MAC Level of consciousness: awake and alert Pain management: satisfactory to patient Vital Signs Assessment: post-procedure vital signs reviewed and stable Respiratory status: respiratory function stable Cardiovascular status: blood pressure returned to baseline Anesthetic complications: no    Last Vitals:  Filed Vitals:   09/17/15 1103 09/22/15 0828  BP: 127/66 158/71  Pulse: 83 68  Temp:  36.8 C  Resp:  16    Last Pain: There were no vitals filed for this visit.               Michaele OfferSavage,  Kipling Graser A

## 2015-09-22 NOTE — Op Note (Signed)
Date of Surgery: 09/22/2015 Date of Dictation: 09/22/2015 10:55 AM Pre-operative Diagnosis:  Nuclear Sclerotic Cataract left Eye Post-operative Diagnosis: same Procedure performed: Extra-capsular Cataract Extraction (ECCE) with placement of a posterior chamber intraocular lens (IOL) left Eye IOL:  Implant Name Type Inv. Item Serial No. Manufacturer Lot No. LRB No. Used  LENS IOL ACRYSOF IQ 19.0 - [id number] Intraocular Lens LENS IOL ACRYSOF IQ 19.0 [id number] ALCON   Left 1   Anesthesia: 2% Lidocaine and 4% Marcaine in a 50/50 mixture with 10 unites/ml of Hylenex given as a peribulbar Anesthesiologist: Anesthesiologist: Gijsbertus F Darleene CleaverVan Staveren, MD CRNA: Michaele OfferKasey Savage, CRNA Complications: none Estimated Blood Loss: less than 1 ml  Description of procedure:  The patient was given anesthesia and sedation via intravenous access. The patient was then prepped and draped in the usual fashion. A 25-gauge needle was bent for initiating the capsulorhexis. A 5-0 silk suture was placed through the conjunctiva superior and inferiorly to serve as bridle sutures. Hemostasis was obtained at the superior limbus using an eraser cautery. A partial thickness groove was made at the anterior surgical limbus with a 64 Beaver blade and this was dissected anteriorly with an SYSCOlcon Crescent knife. The anterior chamber was entered at 10 o'clock with a 1.0 mm paracentesis knife and through the lamellar dissection with a 2.6 mm Alcon keratome. Epi-Shugarcaine 0.5 CC [9 cc BSS Plus (Alcon), 3 cc 4% preservative-free lidocaine (Hospira) and 4 cc 1:1000 preservative-free, bisulfite-free epinephrine] was injected into the anterior chamber via the paracentesis tract. Epi-Shugarcaine 0.5 CC [9 cc BSS Plus (Alcon), 3 cc 4% preservative-free lidocaine (Hospira) and 4 cc 1:1000 preservative-free, bisulfite-free epinephrine] was injected into the anterior chamber via the paracentesis tract. DiscoVisc was injected to replace the aqueous  and a continuous tear curvilinear capsulorhexis was performed using a bent 25-gauge needle.  Balance salt on a syringe was used to perform hydro-dissection and phacoemulsification was carried out using a divide and conquer technique. Procedure(s) with comments: CATARACT EXTRACTION PHACO AND INTRAOCULAR LENS PLACEMENT (IOC) (Left) - US 01:28 AP% 20.9 CDE 29.54 fluid pack lot # [id number]. Irrigation/aspiration was used to remove the residual cortex and the capsular bag was inflated with DiscoVisc. The intraocular lens was inserted into the capsular bag using a pre-loaded UltraSert Delivery System. Irrigation/aspiration was used to remove the residual DiscoVisc. The wound was inflated with balanced salt and checked for leaks. None were found. Miostat was injected via the paracentesis track and 0.1 ml of Vigamox containing 1 mg of drug  was injected via the paracentesis track. The wound was checked for leaks again and none were found.   The bridal sutures were removed and two drops of Vigamox were placed on the eye. An eye shield was placed to protect the eye and the patient was discharged to the recovery area in good condition.   Siri Buege MD

## 2016-06-10 IMAGING — CR DG CHEST 2V
1 series · 2 of 2 positions shown · non-contrast
Comparison: 08/01/2013.

CLINICAL DATA: Pneumonia.

EXAM:
CHEST  2 VIEW

[Series 1: dg chest 2 view · 0.14mm/px · 2 of 2 slices shown]
[im 1/2]
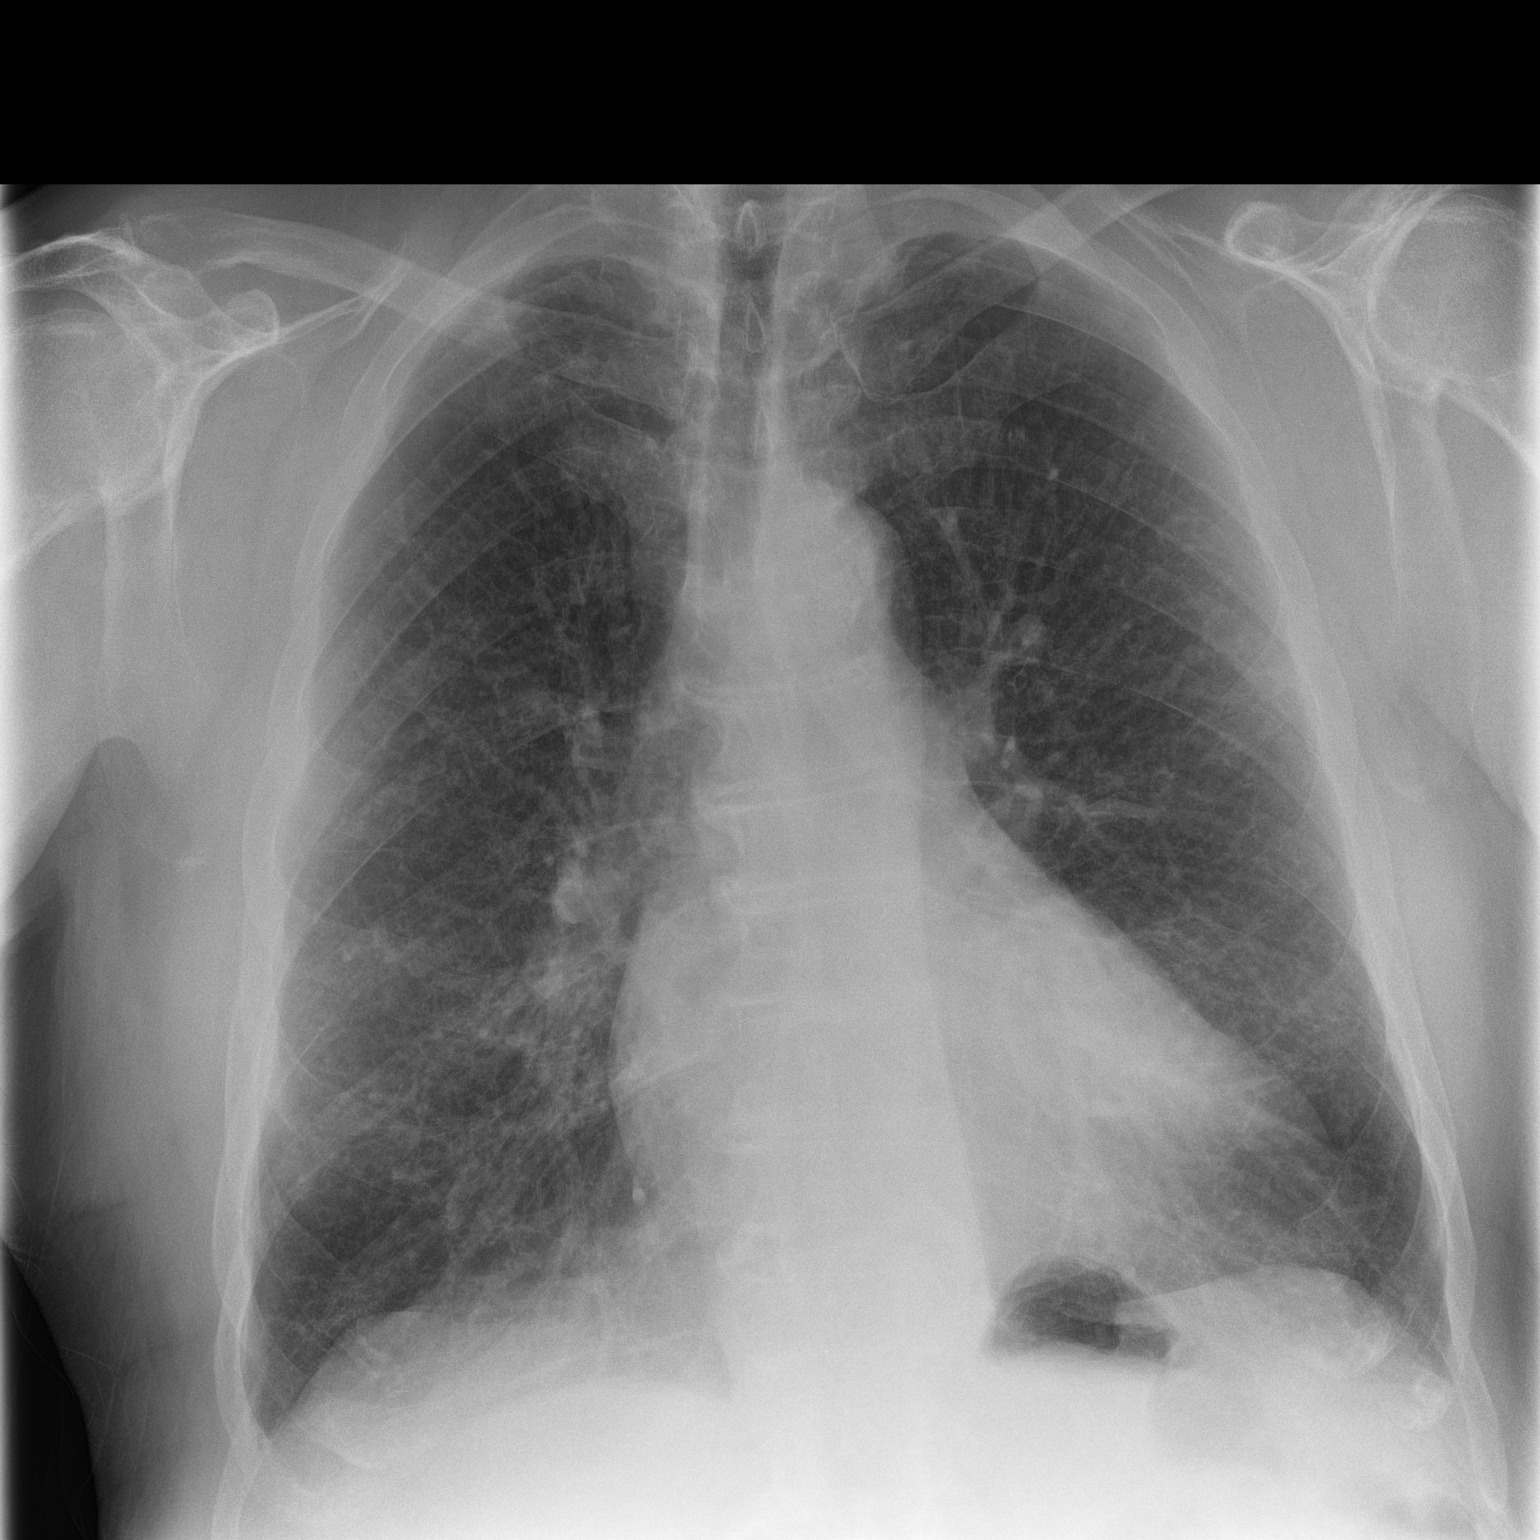
[im 2/2]
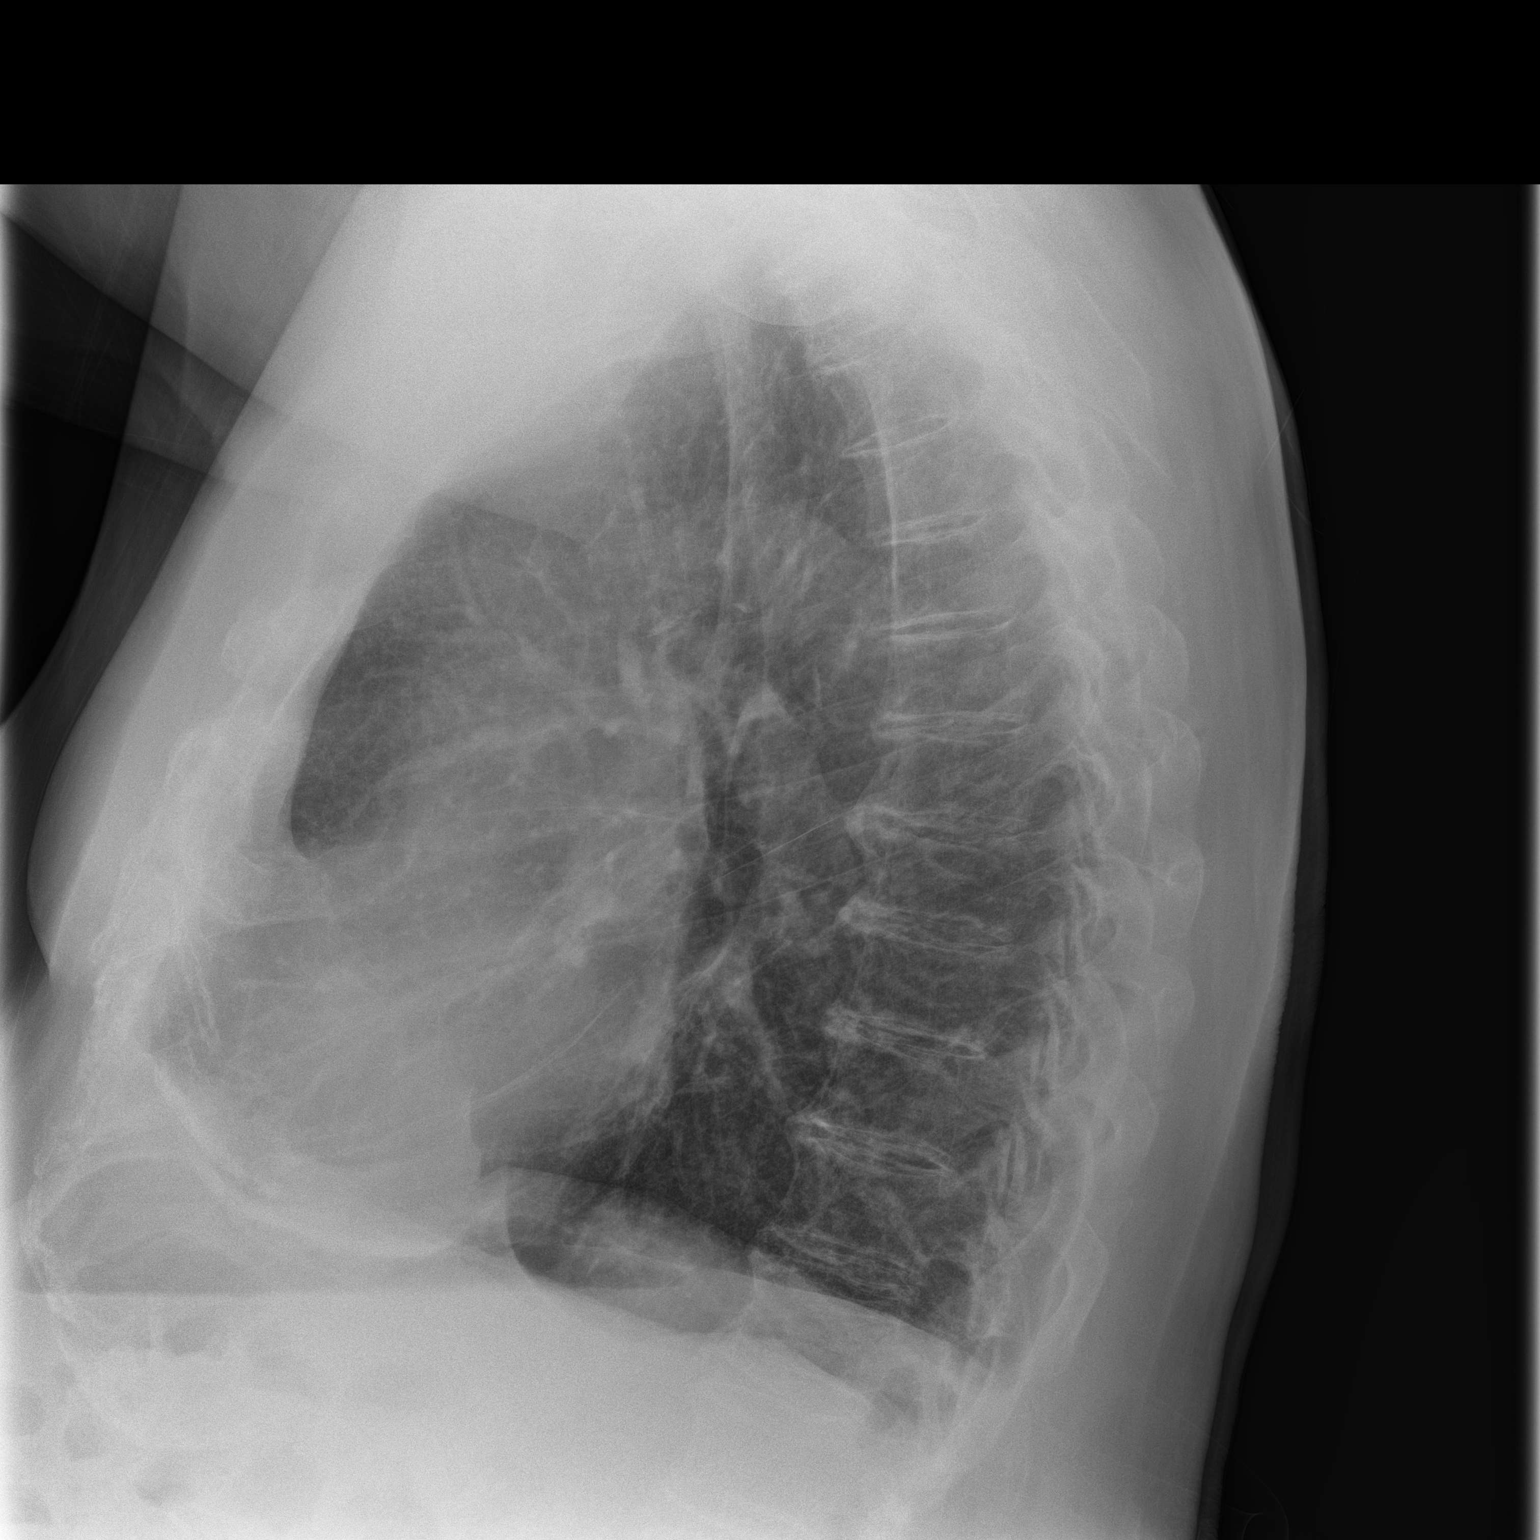

[2 of 2 positions shown; findings below may reference images not displayed]

FINDINGS: Mediastinum and hilar structures normal. Cardiomegaly with normal
pulmonary vascularity. Diffuse mild pulmonary interstitial
prominence noted. A component these interstitial changes may be
related to chronic interstitial lung disease. Active pneumonitis
cannot be excluded. No focal alveolar infiltrate. Cardiomegaly with
normal pulmonary vascularity. No acute bony abnormality.
IMPRESSION: Bilateral mild interstitial prominence, a component of which may be
represent chronic interstitial lung disease. Active pneumonitis
cannot be excluded.

## 2016-06-26 ENCOUNTER — Emergency Department
Admission: EM | Admit: 2016-06-26 | Discharge: 2016-06-26 | Disposition: A | Discharge status: Home / Self Care | Payer: Medicare Other | Attending: Emergency Medicine | Admitting: Emergency Medicine

## 2016-06-26 ENCOUNTER — Encounter: Payer: Self-pay | Admitting: Emergency Medicine

## 2016-06-26 ENCOUNTER — Emergency Department: Payer: Medicare Other

## 2016-06-26 DIAGNOSIS — Z7984 Long term (current) use of oral hypoglycemic drugs: Secondary | ICD-10-CM | POA: Diagnosis not present

## 2016-06-26 DIAGNOSIS — J441 Chronic obstructive pulmonary disease with (acute) exacerbation: Secondary | ICD-10-CM | POA: Insufficient documentation

## 2016-06-26 DIAGNOSIS — Z7982 Long term (current) use of aspirin: Secondary | ICD-10-CM | POA: Diagnosis not present

## 2016-06-26 DIAGNOSIS — Z87891 Personal history of nicotine dependence: Secondary | ICD-10-CM | POA: Insufficient documentation

## 2016-06-26 DIAGNOSIS — J45909 Unspecified asthma, uncomplicated: Secondary | ICD-10-CM | POA: Diagnosis not present

## 2016-06-26 DIAGNOSIS — Z79899 Other long term (current) drug therapy: Secondary | ICD-10-CM | POA: Diagnosis not present

## 2016-06-26 DIAGNOSIS — I509 Heart failure, unspecified: Secondary | ICD-10-CM | POA: Diagnosis not present

## 2016-06-26 DIAGNOSIS — J069 Acute upper respiratory infection, unspecified: Secondary | ICD-10-CM

## 2016-06-26 DIAGNOSIS — I110 Hypertensive heart disease with heart failure: Secondary | ICD-10-CM | POA: Diagnosis not present

## 2016-06-26 DIAGNOSIS — E119 Type 2 diabetes mellitus without complications: Secondary | ICD-10-CM | POA: Insufficient documentation

## 2016-06-26 DIAGNOSIS — E039 Hypothyroidism, unspecified: Secondary | ICD-10-CM | POA: Insufficient documentation

## 2016-06-26 DIAGNOSIS — R0602 Shortness of breath: Secondary | ICD-10-CM | POA: Diagnosis present

## 2016-06-26 LAB — CBC WITH DIFFERENTIAL/PLATELET
Basophils Absolute: 0.1 10*3/uL (ref 0–0.1)
Basophils Relative: 1 %
EOS ABS: 0.1 10*3/uL (ref 0–0.7)
EOS PCT: 1 %
HCT: 49 % (ref 40.0–52.0)
HEMOGLOBIN: 17.1 g/dL (ref 13.0–18.0)
LYMPHS ABS: 1.6 10*3/uL (ref 1.0–3.6)
Lymphocytes Relative: 16 %
MCH: 34.6 pg — AB (ref 26.0–34.0)
MCHC: 35 g/dL (ref 32.0–36.0)
MCV: 99 fL (ref 80.0–100.0)
MONOS PCT: 9 %
Monocytes Absolute: 0.9 10*3/uL (ref 0.2–1.0)
NEUTROS PCT: 73 %
Neutro Abs: 7.5 10*3/uL — ABNORMAL HIGH (ref 1.4–6.5)
Platelets: 226 10*3/uL (ref 150–440)
RBC: 4.95 MIL/uL (ref 4.40–5.90)
RDW: 14.2 % (ref 11.5–14.5)
WBC: 10.2 10*3/uL (ref 3.8–10.6)

## 2016-06-26 LAB — BASIC METABOLIC PANEL
Anion gap: 10 (ref 5–15)
BUN: 26 mg/dL — AB (ref 6–20)
CHLORIDE: 96 mmol/L — AB (ref 101–111)
CO2: 28 mmol/L (ref 22–32)
CREATININE: 1.26 mg/dL — AB (ref 0.61–1.24)
Calcium: 8.8 mg/dL — ABNORMAL LOW (ref 8.9–10.3)
GFR calc Af Amer: >60 mL/min (ref >60)
GFR calc non Af Amer: 53 mL/min — ABNORMAL LOW (ref >60)
GLUCOSE: 184 mg/dL — AB (ref 65–99)
POTASSIUM: 4.1 mmol/L (ref 3.5–5.1)
SODIUM: 134 mmol/L — AB (ref 135–145)

## 2016-06-26 LAB — TROPONIN I

## 2016-06-26 MED ORDER — IPRATROPIUM-ALBUTEROL 0.5-2.5 (3) MG/3ML IN SOLN
3.0000 mL | Freq: Once | RESPIRATORY_TRACT | Status: AC
  Administered 2016-06-26: 3 mL via RESPIRATORY_TRACT
  Filled 2016-06-26: qty 3

## 2016-06-26 MED ORDER — DEXAMETHASONE SODIUM PHOSPHATE 10 MG/ML IJ SOLN
10.0000 mg | Freq: Once | INTRAMUSCULAR | Status: AC
  Administered 2016-06-26: 10 mg via INTRAMUSCULAR
  Filled 2016-06-26: qty 1

## 2016-06-26 MED ORDER — ALBUTEROL SULFATE (2.5 MG/3ML) 0.083% IN NEBU: 2.5000 mg | INHALATION_SOLUTION | Freq: Four times a day (QID) | RESPIRATORY_TRACT | 1 refills | Status: DC | PRN

## 2016-06-26 MED ORDER — ALBUTEROL SULFATE (2.5 MG/3ML) 0.083% IN NEBU
5.0000 mg | INHALATION_SOLUTION | Freq: Once | RESPIRATORY_TRACT | Status: AC
  Administered 2016-06-26: 5 mg via RESPIRATORY_TRACT
  Filled 2016-06-26: qty 6

## 2016-06-26 NOTE — ED Triage Notes (Signed)
Pt report is being treated for double pneumonia and is not getting any better. Sent here by [facility] for eval. Pt initial SpO2 89% RA, pt placed on 2L O2 via [place] and SpO2 increased to 93%.

## 2016-06-26 NOTE — ED Notes (Signed)
Pt is out of home neb meds.

## 2016-06-26 NOTE — ED Provider Notes (Signed)
[facility] Emergency Department Provider Note   ____________________________________________    I have reviewed the triage vital signs and the nursing notes.   HISTORY  Chief Complaint Shortness of Breath     HPI [name] is a 81 y.o. male with a history of CHF COPD and diabetes who presents with complaints of shortness of breath. Patient was told in urgent care that he may have a pneumonia and was told to come to the emergency department. Patient reports 1 week of gradually worsening shortness of breath. He denies fevers or chills. He reports intermittent cough. Patient uses oxygen at night for his COPD. No recent travel. No calf pain or swelling   Past Medical History:  Diagnosis Date  . Arthritis   . Asthma   . CHF (congestive heart failure) (HCC)   . COPD (chronic obstructive pulmonary disease) (HCC)   . Diabetes mellitus without complication (HCC)   . GERD (gastroesophageal reflux disease)   . H/O Graves' disease   . H/O wheezing   . HOH (hard of hearing)   . Hypercholesterolemia   . Hypertension   . Hypothyroidism   . Lower extremity edema   . Multiple allergies   . Palpitations   . Peripheral vascular disease (HCC)    swelling of feet and legs  . Pneumonia    in the past  . PONV (postoperative nausea and vomiting)   . Shortness of breath dyspnea   . Sleep apnea    CPAP    Patient Active Problem List   Diagnosis Date Noted  . HYPONATREMIA, CHRONIC 03/12/2007  . PERIPHERAL NEUROPATHY 03/12/2007  . 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 HX, PERSONAL, TOBACCO USE 03/12/2007  . HYPOTHYROIDISM 03/09/2007  . DIABETES MELLITUS, TYPE II 03/09/2007  . HYPERLIPIDEMIA 03/09/2007  . HYPERTENSION 03/09/2007  . BENIGN PROSTATIC HYPERTROPHY 03/09/2007    Past Surgical History:  Procedure Laterality Date  . CARDIAC CATHETERIZATION    . CATARACT EXTRACTION W/PHACO Right 09/01/2015   Procedure: CATARACT EXTRACTION PHACO AND INTRAOCULAR  LENS PLACEMENT (IOC);  Surgeon: Sallee LangeSteven Dingeldein, MD;  Location: [facility] ORS;  Service: Ophthalmology;  Laterality: Right;  US 01:33 AP% 24.9 CDE 42.99 fluid pack lot # [id number]  . CATARACT EXTRACTION W/PHACO Left 09/22/2015   Procedure: CATARACT EXTRACTION PHACO AND INTRAOCULAR LENS PLACEMENT (IOC);  Surgeon: Sallee LangeSteven Dingeldein, MD;  Location: [facility] ORS;  Service: Ophthalmology;  Laterality: Left;  US 01:28 AP% 20.9 CDE 29.54 fluid pack lot # [id number] H  . CHOLECYSTECTOMY    . EYE SURGERY    . Orbital Decompression    . TONSILLECTOMY      Prior to Admission medications   Medication Sig Start Date End Date Taking? Authorizing Provider  albuterol (PROAIR HFA) 108 (90 Base) MCG/ACT inhaler Inhale 2 puffs into the lungs every 6 (six) hours as needed for wheezing or shortness of breath.    Historical Provider, MD  albuterol (PROVENTIL) (2.5 MG/3ML) 0.083% nebulizer solution Take 3 mLs (2.5 mg total) by nebulization every 6 (six) hours as needed for wheezing or shortness of breath. 06/26/16   Jene Everyobert Ouida Abeyta, MD  aspirin EC 81 MG tablet Take 81 mg by mouth daily.    Historical Provider, MD  Bromfenac Sodium (BROMSITE) 0.075 % SOLN Apply 1 drop to eye 2 (two) times daily.    Historical Provider, MD  canagliflozin (INVOKANA) 300 MG TABS tablet Take 300 mg by mouth daily before breakfast.    Historical Provider, MD  carvedilol (COREG) 25 MG tablet  Take 25 mg by mouth 2 (two) times daily with a meal.    Historical Provider, MD  cholecalciferol (VITAMIN D) 1000 units tablet Take 1,000 Units by mouth at bedtime.     Historical Provider, MD  Difluprednate (DUREZOL) 0.05 % EMUL Apply 1 drop to eye 2 (two) times daily.    Historical Provider, MD  ergocalciferol (VITAMIN D2) 50000 units capsule Take 50,000 Units by mouth once a week.    Historical Provider, MD  Exenatide ER (BYDUREON) 2 MG PEN Inject into the skin once a week.    Historical Provider, MD  Fluticasone-Salmeterol (ADVAIR) 250-50 MCG/DOSE AEPB Inhale  1 puff into the lungs 2 (two) times daily.    Historical Provider, MD  furosemide (LASIX) 20 MG tablet Take 20 mg by mouth 2 (two) times daily.    Historical Provider, MD  gabapentin (NEURONTIN) 600 MG tablet Take 600 mg by mouth 2 (two) times daily.    Historical Provider, MD  glipiZIDE (GLUCOTROL) 5 MG tablet Take 5 mg by mouth daily before breakfast.    Historical Provider, MD  levothyroxine (SYNTHROID, LEVOTHROID) 137 MCG tablet Take 137 mcg by mouth daily before breakfast.    Historical Provider, MD  magnesium oxide (MAG-OX) 400 MG tablet Take 400 mg by mouth 2 (two) times daily.    Historical Provider, MD  montelukast (SINGULAIR) 10 MG tablet Take 10 mg by mouth at bedtime.    Historical Provider, MD  nepafenac (ILEVRO) 0.3 % ophthalmic suspension 1 drop at bedtime.    Historical Provider, MD  pravastatin (PRAVACHOL) 20 MG tablet Take 20 mg by mouth at bedtime.    Historical Provider, MD  quinapril (ACCUPRIL) 40 MG tablet Take 20 mg by mouth at bedtime.    Historical Provider, MD  ranitidine (ZANTAC) 150 MG tablet Take 150 mg by mouth at bedtime.    Historical Provider, MD  tiotropium (SPIRIVA) 18 MCG inhalation capsule Place 18 mcg into inhaler and inhale daily.    Historical Provider, MD  Travoprost, BAK Free, (TRAVATAN) 0.004 % SOLN ophthalmic solution 1 drop at bedtime.    Historical Provider, MD     Allergies Lodine [etodolac] and Cefaclor  No family history on file.  Social History Social History  Substance Use Topics  . Smoking status: Former Games developer  . Smokeless tobacco: Never Used  . Alcohol use Yes     Comment: couple glasses of wine daily    Review of Systems  Constitutional: No fever/chills Eyes: No visual changes.   Cardiovascular: Denies chest pain. Respiratory: As above Gastrointestinal: No abdominal pain.  No nausea, no vomiting.    Musculoskeletal: Negative for back pain. Skin: Negative for rash. Neurological: Negative for headache  10-point ROS  otherwise negative.  ____________________________________________   PHYSICAL EXAM:  VITAL SIGNS: ED Triage Vitals  Enc Vitals Group     BP 06/26/16 1656 137/76     Pulse Rate 06/26/16 1656 86     Resp 06/26/16 1656 20     Temp 06/26/16 1656 98.2 F (36.8 C)     Temp Source 06/26/16 1656 Oral     SpO2 06/26/16 1656 94 %     Weight 06/26/16 1657 244 lb (110.7 kg)     Height 06/26/16 1657 5' 10.5" (1.791 m)     Head Circumference --      Peak Flow --      Pain Score --      Pain Loc --      Pain Edu? --  Excl. in GC? --     Constitutional: Alert and oriented. No acute distress. Pleasant and interactive Eyes: Conjunctivae are normal.  Head: Atraumatic. Nose: No congestion/rhinnorhea. Mouth/Throat: Mucous membranes are moist.    Cardiovascular: Normal rate, regular rhythm. Grossly normal heart sounds.  Good peripheral circulation. Respiratory: Normal respiratory effort.  No retractions. Bilateral wheezing Gastrointestinal: Soft and nontender. No distention.  No CVA tenderness. Genitourinary: deferred Musculoskeletal: No lower extremity tenderness nor edema.  Warm and well perfused Neurologic:  Normal speech and language. No gross focal neurologic deficits are appreciated.  Skin:  Skin is warm, dry and intact. No rash noted. Psychiatric: Mood and affect are normal. Speech and behavior are normal.  ____________________________________________   LABS (all labs ordered are listed, but only abnormal results are displayed)  Labs Reviewed  CBC WITH DIFFERENTIAL/PLATELET - Abnormal; Notable for the following:       Result Value   MCH 34.6 (*)    Neutro Abs 7.5 (*)    All other components within normal limits  BASIC METABOLIC PANEL - Abnormal; Notable for the following:    Sodium 134 (*)    Chloride 96 (*)    Glucose, Bld 184 (*)    BUN 26 (*)    Creatinine, Ser 1.26 (*)    Calcium 8.8 (*)    GFR calc non Af Amer 53 (*)    All other components within normal limits    TROPONIN I   ____________________________________________  EKG  ED ECG REPORT I, Jene Every, the attending physician, personally viewed and interpreted this ECG.  Date: 06/26/2016  Rate: 85 Rhythm: normal sinus rhythm QRS Axis: normal Intervals: normal ST/T Wave abnormalities: Nonspecific Conduction Disturbances: IVCD   ____________________________________________  RADIOLOGY  Chest x-ray unremarkable ____________________________________________   PROCEDURES  Procedure(s) performed: No    Critical Care performed: No ____________________________________________   INITIAL IMPRESSION / ASSESSMENT AND PLAN / ED COURSE  Pertinent labs & imaging results that were available during my care of the patient were reviewed by me and considered in my medical decision making (see chart for details).  Patient's workup is reassuring, no evidence for pneumonia. I suspect COPD exacerbation. Patient was treated with Decadron and DuoNeb nebs which significantly improved his symptoms. On reexam he was moving far better air with decreased wheezing. The patient is anxious to go home and I think this is reasonable. He will use his nebulizer every 4-6 hours. He knows to return if any worsening of his symptoms.    ____________________________________________   FINAL CLINICAL IMPRESSION(S) / ED DIAGNOSES  Final diagnoses:  COPD exacerbation (HCC)  Viral upper respiratory tract infection      NEW MEDICATIONS STARTED DURING THIS VISIT:  Discharge Medication List as of 06/26/2016 10:18 PM    START taking these medications   Details  albuterol (PROVENTIL) (2.5 MG/3ML) 0.083% nebulizer solution Take 3 mLs (2.5 mg total) by nebulization every 6 (six) hours as needed for wheezing or shortness of breath., Starting Sat 06/26/2016, Print         Note:  This document was prepared using Dragon voice recognition software and may include unintentional dictation errors.    Jene Every, MD 06/26/16 2322

## 2017-08-10 ENCOUNTER — Ambulatory Visit (INDEPENDENT_AMBULATORY_CARE_PROVIDER_SITE_OTHER): Payer: Medicare Other

## 2017-08-10 DIAGNOSIS — G4733 Obstructive sleep apnea (adult) (pediatric): Secondary | ICD-10-CM | POA: Diagnosis not present

## 2017-08-10 NOTE — Progress Notes (Signed)
95 percentile pressure 12   95th percentile leak 31.6   apnea index 0.2 /hr  apnea-hypopnea index  1.0 /hr   total days used  >4 hr 166 days  total days used <4 hr 3 days  Total compliance 92 percent  Doing great with cpap.Pt had fell and broke nose recommended trying a new full face mask resmed F-30 to see if can get mask off bridge of nose

## 2017-08-25 ENCOUNTER — Ambulatory Visit (INDEPENDENT_AMBULATORY_CARE_PROVIDER_SITE_OTHER): Payer: Medicare Other | Admitting: Internal Medicine

## 2017-08-25 ENCOUNTER — Encounter: Payer: Self-pay | Admitting: Internal Medicine

## 2017-08-25 VITALS — BP 108/52 | HR 62 | Resp 16 | Ht 70.5 in | Wt 238.8 lb

## 2017-08-25 DIAGNOSIS — K219 Gastro-esophageal reflux disease without esophagitis: Secondary | ICD-10-CM | POA: Diagnosis not present

## 2017-08-25 DIAGNOSIS — G4733 Obstructive sleep apnea (adult) (pediatric): Secondary | ICD-10-CM | POA: Diagnosis not present

## 2017-08-25 DIAGNOSIS — J449 Chronic obstructive pulmonary disease, unspecified: Secondary | ICD-10-CM | POA: Diagnosis not present

## 2017-08-25 DIAGNOSIS — I509 Heart failure, unspecified: Secondary | ICD-10-CM | POA: Diagnosis not present

## 2017-08-25 DIAGNOSIS — Z9989 Dependence on other enabling machines and devices: Secondary | ICD-10-CM

## 2017-08-25 NOTE — Progress Notes (Signed)
[facility] [address] [place], [place] [address]  Pulmonary Sleep Medicine  Office Visit Note  Patient Name: [name] DOB: October 03, 1935 MRN [id number]  Date of Service: 08/25/2017  Complaints/HPI:  He is doing well.  No admissions to the hospital.  He has had no cough congestion chest pain no fevers.  Has been using his medications as prescribed with good results.  ROS  General: (-) fever, (-) chills, (-) night sweats, (-) weakness Skin: (-) rashes, (-) itching,. Eyes: (-) visual changes, (-) redness, (-) itching. Nose and Sinuses: (-) nasal stuffiness or itchiness, (-) postnasal drip, (-) nosebleeds, (-) sinus trouble. Mouth and Throat: (-) sore throat, (-) hoarseness. Neck: (-) swollen glands, (-) enlarged thyroid, (-) neck pain. Respiratory: - cough, (-) bloody sputum, + shortness of breath, - wheezing. Cardiovascular: - ankle swelling, (-) chest pain. Lymphatic: (-) lymph node enlargement. Neurologic: (-) numbness, (-) tingling. Psychiatric: (-) anxiety, (-) depression   Current Medication: Outpatient Encounter Medications as of 08/25/2017  Medication Sig  . albuterol (PROAIR HFA) 108 (90 Base) MCG/ACT inhaler Inhale 2 puffs into the lungs every 6 (six) hours as needed for wheezing or shortness of breath.  Marland Kitchen albuterol (PROVENTIL) (2.5 MG/3ML) 0.083% nebulizer solution Take 3 mLs (2.5 mg total) by nebulization every 6 (six) hours as needed for wheezing or shortness of breath.  Marland Kitchen aspirin EC 81 MG tablet Take 81 mg by mouth daily.  . Bromfenac Sodium (BROMSITE) 0.075 % SOLN Apply 1 drop to eye 2 (two) times daily.  . canagliflozin (INVOKANA) 300 MG TABS tablet Take 300 mg by mouth daily before breakfast.  . carvedilol (COREG) 25 MG tablet Take 25 mg by mouth 2 (two) times daily with a meal.  . cholecalciferol (VITAMIN D) 1000 units tablet Take 1,000 Units by mouth at bedtime.   . Difluprednate (DUREZOL) 0.05 % EMUL Apply 1 drop to eye 2 (two) times daily.   . ergocalciferol (VITAMIN D2) 50000 units capsule Take 50,000 Units by mouth once a week.  . Exenatide ER (BYDUREON) 2 MG PEN Inject into the skin once a week.  . Fluticasone-Salmeterol (ADVAIR) 250-50 MCG/DOSE AEPB Inhale 1 puff into the lungs 2 (two) times daily.  . furosemide (LASIX) 20 MG tablet Take 20 mg by mouth 2 (two) times daily.  Marland Kitchen gabapentin (NEURONTIN) 600 MG tablet Take 600 mg by mouth 2 (two) times daily.  Marland Kitchen glipiZIDE (GLUCOTROL) 5 MG tablet Take 5 mg by mouth daily before breakfast.  . levothyroxine (SYNTHROID, LEVOTHROID) 137 MCG tablet Take 137 mcg by mouth daily before breakfast.  . magnesium oxide (MAG-OX) 400 MG tablet Take 400 mg by mouth 2 (two) times daily.  . montelukast (SINGULAIR) 10 MG tablet Take 10 mg by mouth at bedtime.  . nepafenac (ILEVRO) 0.3 % ophthalmic suspension 1 drop at bedtime.  . pravastatin (PRAVACHOL) 20 MG tablet Take 20 mg by mouth at bedtime.  . quinapril (ACCUPRIL) 40 MG tablet Take 20 mg by mouth at bedtime.  . ranitidine (ZANTAC) 150 MG tablet Take 150 mg by mouth at bedtime.  Marland Kitchen tiotropium (SPIRIVA) 18 MCG inhalation capsule Place 18 mcg into inhaler and inhale daily.  . Travoprost, BAK Free, (TRAVATAN) 0.004 % SOLN ophthalmic solution 1 drop at bedtime.   No facility-administered encounter medications on file as of 08/25/2017.     Surgical History: Past Surgical History:  Procedure Laterality Date  . CARDIAC CATHETERIZATION    . CATARACT EXTRACTION W/PHACO Right 09/01/2015   Procedure: CATARACT EXTRACTION PHACO  AND INTRAOCULAR LENS PLACEMENT (IOC);  Surgeon: Sallee LangeSteven Dingeldein, MD;  Location: [facility] ORS;  Service: Ophthalmology;  Laterality: Right;  US 01:33 AP% 24.9 CDE 42.99 fluid pack lot # [id number]  . CATARACT EXTRACTION W/PHACO Left 09/22/2015   Procedure: CATARACT EXTRACTION PHACO AND INTRAOCULAR LENS PLACEMENT (IOC);  Surgeon: Sallee LangeSteven Dingeldein, MD;  Location: [facility] ORS;  Service: Ophthalmology;  Laterality: Left;  US 01:28 AP%  20.9 CDE 29.54 fluid pack lot # [id number]  . CHOLECYSTECTOMY    . EYE SURGERY    . Orbital Decompression    . TONSILLECTOMY      Medical History: Past Medical History:  Diagnosis Date  . Arthritis   . Asthma   . CHF (congestive heart failure) (HCC)   . COPD (chronic obstructive pulmonary disease) (HCC)   . Diabetes mellitus without complication (HCC)   . GERD (gastroesophageal reflux disease)   . H/O Graves' disease   . H/O wheezing   . HOH (hard of hearing)   . Hypercholesterolemia   . Hypertension   . Hypothyroidism   . Lower extremity edema   . Multiple allergies   . Palpitations   . Peripheral vascular disease (HCC)    swelling of feet and legs  . Pneumonia    in the past  . PONV (postoperative nausea and vomiting)   . Shortness of breath dyspnea   . Sleep apnea    CPAP    Family History: History reviewed. No pertinent family history.  Social History: Social History   Socioeconomic History  . Marital status: Married    Spouse name: Not on file  . Number of children: Not on file  . Years of education: Not on file  . Highest education level: Not on file  Occupational History  . Not on file  Social Needs  . Financial resource strain: Not on file  . Food insecurity:    Worry: Not on file    Inability: Not on file  . Transportation needs:    Medical: Not on file    Non-medical: Not on file  Tobacco Use  . Smoking status: Former Games developermoker  . Smokeless tobacco: Never Used  Substance and Sexual Activity  . Alcohol use: Yes    Comment: couple glasses of wine daily  . Drug use: No  . Sexual activity: Not on file  Lifestyle  . Physical activity:    Days per week: Not on file    Minutes per session: Not on file  . Stress: Not on file  Relationships  . Social connections:    Talks on phone: Not on file    Gets together: Not on file    Attends religious service: Not on file    Active member of club or organization: Not on file    Attends meetings of  clubs or organizations: Not on file    Relationship status: Not on file  . Intimate partner violence:    Fear of current or ex partner: Not on file    Emotionally abused: Not on file    Physically abused: Not on file    Forced sexual activity: Not on file  Other Topics Concern  . Not on file  Social History Narrative  . Not on file    Vital Signs: Blood pressure (!) 108/52, pulse 62, resp. rate 16, height 5' 10.5" (1.791 m), weight 238 lb 12.8 oz (108.3 kg), SpO2 91 %.  Examination: General Appearance: The patient is well-developed, well-nourished, and in no distress. Skin: Gross inspection  of skin unremarkable. Head: normocephalic, no gross deformities. Eyes: no gross deformities noted. ENT: ears appear grossly normal no exudates. Neck: Supple. No thyromegaly. No LAD. Respiratory: clear at this time. Cardiovascular: Normal S1 and S2 without murmur or rub. Extremities: No cyanosis. pulses are equal. Neurologic: Alert and oriented. No involuntary movements.  LABS: No results found for this or any previous visit (from the past 2160 hour(s)).  Radiology: Dg Chest 2 View  Result Date: 06/26/2016 CLINICAL DATA:  Worsening shortness of breath today. Asthma and COPD. EXAM: CHEST  2 VIEW COMPARISON:  12/16/2014 FINDINGS: The heart size and mediastinal contours are within normal limits. Aortic atherosclerosis. Pulmonary hyperinflation again demonstrated as well as diffuse pulmonary interstitial prominence, without significant change compared to prior study. No evidence of acute infiltrate or pulmonary edema. No evidence of pneumothorax or pleural effusion. IMPRESSION: COPD.  No active cardiopulmonary disease. Electronically Signed   By: Myles Rosenthal M.D.   On: 06/26/2016 19:26    No results found.  No results found.    Assessment and Plan: Patient Active Problem List   Diagnosis Date Noted  . HYPONATREMIA, CHRONIC 03/12/2007  . PERIPHERAL NEUROPATHY 03/12/2007  . PEYRONIE'S  DISEASE 03/12/2007  . HX, PERSONAL, TOBACCO USE 03/12/2007  . HYPOTHYROIDISM 03/09/2007  . DIABETES MELLITUS, TYPE II 03/09/2007  . HYPERLIPIDEMIA 03/09/2007  . HYPERTENSION 03/09/2007  . BENIGN PROSTATIC HYPERTROPHY 03/09/2007    1. OSA  Stable at this time we will continue with supportive care patient has been using the CPAP last down mode was reviewed has excellent compliance 2. COPD stable no exacerbations in no admissions to the hospital 3. GERD controlled we will continue with present therapy 4. CHF patient right now is well controlled follow as with Cardiology  General Counseling: I have discussed the findings of the evaluation and examination with Maximilliano.  I have also discussed any further diagnostic evaluation thatmay be needed or ordered today. Holbert verbalizes understanding of the findings of todays visit. We also reviewed his medications today and discussed drug interactions and side effects including but not limited excessive drowsiness and altered mental states. We also discussed that there is always a risk not just to him but also people around him. he has been encouraged to call the office with any questions or concerns that should arise related to todays visit.    Time spent:  I have personally obtained a history, examined the patient, evaluated laboratory and imaging results, formulated the assessment and plan and placed orders.    Yevonne Pax, MD [facility] Pulmonary and Critical Care Sleep medicine

## 2017-08-25 NOTE — Patient Instructions (Signed)

## 2017-10-18 ENCOUNTER — Other Ambulatory Visit: Payer: Self-pay

## 2017-10-18 MED ORDER — TRELEGY ELLIPTA 100-62.5-25 MCG/INH IN AEPB: 1.0000 | INHALATION_SPRAY | Freq: Every day | RESPIRATORY_TRACT | 4 refills | Status: DC

## 2018-01-03 ENCOUNTER — Encounter: Payer: Self-pay | Admitting: Internal Medicine

## 2018-01-03 ENCOUNTER — Ambulatory Visit (INDEPENDENT_AMBULATORY_CARE_PROVIDER_SITE_OTHER): Payer: Medicare Other | Admitting: Internal Medicine

## 2018-01-03 VITALS — BP 140/80 | HR 90 | Resp 16 | Ht 70.5 in | Wt 236.6 lb

## 2018-01-03 DIAGNOSIS — I509 Heart failure, unspecified: Secondary | ICD-10-CM | POA: Diagnosis not present

## 2018-01-03 DIAGNOSIS — G4733 Obstructive sleep apnea (adult) (pediatric): Secondary | ICD-10-CM

## 2018-01-03 DIAGNOSIS — K219 Gastro-esophageal reflux disease without esophagitis: Secondary | ICD-10-CM

## 2018-01-03 DIAGNOSIS — J449 Chronic obstructive pulmonary disease, unspecified: Secondary | ICD-10-CM | POA: Diagnosis not present

## 2018-01-03 DIAGNOSIS — Z9989 Dependence on other enabling machines and devices: Secondary | ICD-10-CM

## 2018-01-03 NOTE — Patient Instructions (Signed)

## 2018-01-03 NOTE — Progress Notes (Signed)
[facility] [address] [place], [place] [address]  Pulmonary Sleep Medicine   Office Visit Note  Patient Name: [name] DOB: [birth year] MRN [id number]  Date of Service: 01/03/2018  Complaints/HPI: Pt here for follow up.  He denies need hospitalizations.  He is using his medications and inhaler as prescribed. Overall he is doing well.  He denies excessive coughing, no fevers and no chest pain.    ROS  General: (-) fever, (-) chills, (-) night sweats, (-) weakness Skin: (-) rashes, (-) itching,. Eyes: (-) visual changes, (-) redness, (-) itching. Nose and Sinuses: (-) nasal stuffiness or itchiness, (-) postnasal drip, (-) nosebleeds, (-) sinus trouble. Mouth and Throat: (-) sore throat, (-) hoarseness. Neck: (-) swollen glands, (-) enlarged thyroid, (-) neck pain. Respiratory: - cough, (-) bloody sputum, + shortness of breath, - wheezing. Cardiovascular: - ankle swelling, (-) chest pain. Lymphatic: (-) lymph node enlargement. Neurologic: (-) numbness, (-) tingling. Psychiatric: (-) anxiety, (-) depression   Current Medication: Outpatient Encounter Medications as of 01/03/2018  Medication Sig  . albuterol (PROAIR HFA) 108 (90 Base) MCG/ACT inhaler Inhale 2 puffs into the lungs every 6 (six) hours as needed for wheezing or shortness of breath.  Marland Kitchen albuterol (PROVENTIL) (2.5 MG/3ML) 0.083% nebulizer solution Take 3 mLs (2.5 mg total) by nebulization every 6 (six) hours as needed for wheezing or shortness of breath.  Marland Kitchen aspirin EC 81 MG tablet Take 81 mg by mouth daily.  . Bromfenac Sodium (BROMSITE) 0.075 % SOLN Apply 1 drop to eye 2 (two) times daily.  . canagliflozin (INVOKANA) 300 MG TABS tablet Take 300 mg by mouth daily before breakfast.  . carvedilol (COREG) 25 MG tablet Take 25 mg by mouth 2 (two) times daily with a meal.  . cholecalciferol (VITAMIN D) 1000 units tablet Take 1,000 Units by mouth at bedtime.   . Difluprednate (DUREZOL) 0.05 % EMUL Apply 1  drop to eye 2 (two) times daily.  . ergocalciferol (VITAMIN D2) 50000 units capsule Take 50,000 Units by mouth once a week.  . Exenatide ER (BYDUREON) 2 MG PEN Inject into the skin once a week.  . Fluticasone-Salmeterol (ADVAIR) 250-50 MCG/DOSE AEPB Inhale 1 puff into the lungs 2 (two) times daily.  . furosemide (LASIX) 20 MG tablet Take 20 mg by mouth 2 (two) times daily.  Marland Kitchen gabapentin (NEURONTIN) 600 MG tablet Take 600 mg by mouth 2 (two) times daily.  Marland Kitchen glipiZIDE (GLUCOTROL) 5 MG tablet Take 5 mg by mouth daily before breakfast.  . levothyroxine (SYNTHROID, LEVOTHROID) 137 MCG tablet Take 137 mcg by mouth daily before breakfast.  . magnesium oxide (MAG-OX) 400 MG tablet Take 400 mg by mouth 2 (two) times daily.  . montelukast (SINGULAIR) 10 MG tablet Take 10 mg by mouth at bedtime.  . nepafenac (ILEVRO) 0.3 % ophthalmic suspension 1 drop at bedtime.  . OXYGEN Inhale into the lungs. 2 litre at night  . pravastatin (PRAVACHOL) 20 MG tablet Take 20 mg by mouth at bedtime.  . quinapril (ACCUPRIL) 40 MG tablet Take 20 mg by mouth at bedtime.  . ranitidine (ZANTAC) 150 MG tablet Take 150 mg by mouth at bedtime.  Marland Kitchen tiotropium (SPIRIVA) 18 MCG inhalation capsule Place 18 mcg into inhaler and inhale daily.  . Travoprost, BAK Free, (TRAVATAN) 0.004 % SOLN ophthalmic solution 1 drop at bedtime.  . TRELEGY ELLIPTA 100-62.5-25 MCG/INH AEPB Inhale 1 puff into the lungs daily.   No facility-administered encounter medications on file as of 01/03/2018.  Surgical History: Past Surgical History:  Procedure Laterality Date  . CARDIAC CATHETERIZATION    . CATARACT EXTRACTION W/PHACO Right 09/01/2015   Procedure: CATARACT EXTRACTION PHACO AND INTRAOCULAR LENS PLACEMENT (IOC);  Surgeon: Sallee LangeSteven Dingeldein, MD;  Location: [facility] ORS;  Service: Ophthalmology;  Laterality: Right;  US 01:33 AP% 24.9 CDE 42.99 fluid pack lot # [id number]  . CATARACT EXTRACTION W/PHACO Left 09/22/2015   Procedure: CATARACT  EXTRACTION PHACO AND INTRAOCULAR LENS PLACEMENT (IOC);  Surgeon: Sallee LangeSteven Dingeldein, MD;  Location: [facility] ORS;  Service: Ophthalmology;  Laterality: Left;  US 01:28 AP% 20.9 CDE 29.54 fluid pack lot # [id number]  . CHOLECYSTECTOMY    . EYE SURGERY    . Orbital Decompression    . TONSILLECTOMY      Medical History: Past Medical History:  Diagnosis Date  . Arthritis   . Asthma   . CHF (congestive heart failure) (HCC)   . COPD (chronic obstructive pulmonary disease) (HCC)   . Diabetes mellitus without complication (HCC)   . GERD (gastroesophageal reflux disease)   . H/O Graves' disease   . H/O wheezing   . HOH (hard of hearing)   . Hypercholesterolemia   . Hypertension   . Hypothyroidism   . Lower extremity edema   . Multiple allergies   . Palpitations   . Peripheral vascular disease (HCC)    swelling of feet and legs  . Pneumonia    in the past  . PONV (postoperative nausea and vomiting)   . Shortness of breath dyspnea   . Sleep apnea    CPAP    Family History: Family History  Problem Relation Age of Onset  . Diabetes Father     Social History: Social History   Socioeconomic History  . Marital status: Married    Spouse name: Not on file  . Number of children: Not on file  . Years of education: Not on file  . Highest education level: Not on file  Occupational History  . Not on file  Social Needs  . Financial resource strain: Not on file  . Food insecurity:    Worry: Not on file    Inability: Not on file  . Transportation needs:    Medical: Not on file    Non-medical: Not on file  Tobacco Use  . Smoking status: Former Games developermoker  . Smokeless tobacco: Never Used  Substance and Sexual Activity  . Alcohol use: Yes    Comment: couple glasses of wine daily  . Drug use: No  . Sexual activity: Not on file  Lifestyle  . Physical activity:    Days per week: Not on file    Minutes per session: Not on file  . Stress: Not on file  Relationships  . Social  connections:    Talks on phone: Not on file    Gets together: Not on file    Attends religious service: Not on file    Active member of club or organization: Not on file    Attends meetings of clubs or organizations: Not on file    Relationship status: Not on file  . Intimate partner violence:    Fear of current or ex partner: Not on file    Emotionally abused: Not on file    Physically abused: Not on file    Forced sexual activity: Not on file  Other Topics Concern  . Not on file  Social History Narrative  . Not on file    Vital Signs: Blood pressure 140/80,  pulse 90, resp. rate 16, height 5' 10.5" (1.791 m), weight 236 lb 9.6 oz (107.3 kg), SpO2 94 %.  Examination: General Appearance: The patient is well-developed, well-nourished, and in no distress. Skin: Gross inspection of skin unremarkable. Head: normocephalic, no gross deformities. Eyes: no gross deformities noted. ENT: ears appear grossly normal no exudates. Neck: Supple. No thyromegaly. No LAD. Respiratory: no rhonchi noted. Cardiovascular: Normal S1 and S2 without murmur or rub. Extremities: No cyanosis. pulses are equal. Neurologic: Alert and oriented. No involuntary movements.  LABS: No results found for this or any previous visit (from the past 2160 hour(s)).  Radiology: Dg Chest 2 View  Result Date: 06/26/2016 CLINICAL DATA:  Worsening shortness of breath today. Asthma and COPD. EXAM: CHEST  2 VIEW COMPARISON:  12/16/2014 FINDINGS: The heart size and mediastinal contours are within normal limits. Aortic atherosclerosis. Pulmonary hyperinflation again demonstrated as well as diffuse pulmonary interstitial prominence, without significant change compared to prior study. No evidence of acute infiltrate or pulmonary edema. No evidence of pneumothorax or pleural effusion. IMPRESSION: COPD.  No active cardiopulmonary disease. Electronically Signed   By: Myles Rosenthal M.D.   On: 06/26/2016 19:26    No results  found.  No results found.    Assessment and Plan: Patient Active Problem List   Diagnosis Date Noted  . HYPONATREMIA, CHRONIC 03/12/2007  . PERIPHERAL NEUROPATHY 03/12/2007  . 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 HX, PERSONAL, TOBACCO USE 03/12/2007  . HYPOTHYROIDISM 03/09/2007  . DIABETES MELLITUS, TYPE II 03/09/2007  . HYPERLIPIDEMIA 03/09/2007  . HYPERTENSION 03/09/2007  . BENIGN PROSTATIC HYPERTROPHY 03/09/2007   1. Chronic obstructive pulmonary disease, unspecified COPD type (HCC) Stable with no exacerbations, or hospital admissions.   2. OSA on CPAP Stable at this time  3. Chronic congestive heart failure, unspecified heart failure type (HCC) Continues to be followed by Cardiology.  Stable at this time.   4. Gastroesophageal reflux disease without esophagitis Continue current medications.     General Counseling: I have discussed the findings of the evaluation and examination with Anubis.  I have also discussed any further diagnostic evaluation thatmay be needed or ordered today. Mercy verbalizes understanding of the findings of todays visit. We also reviewed his medications today and discussed drug interactions and side effects including but not limited excessive drowsiness and altered mental states. We also discussed that there is always a risk not just to him but also people around him. he has been encouraged to call the office with any questions or concerns that should arise related to todays visit.    Time spent: This patient was seen by Blima Ledger AGNP-C in Collaboration with Dr. Freda Munro as a part of collaborative care agreement.  I have personally obtained a history, examined the patient, evaluated laboratory and imaging results, formulated the assessment and plan and placed orders.    Yevonne Pax, MD [facility] Pulmonary and Critical Care Sleep medicine

## 2018-01-06 ENCOUNTER — Encounter: Payer: Self-pay | Admitting: Internal Medicine

## 2018-02-07 ENCOUNTER — Encounter: Payer: Self-pay | Admitting: Internal Medicine

## 2018-02-07 ENCOUNTER — Ambulatory Visit (INDEPENDENT_AMBULATORY_CARE_PROVIDER_SITE_OTHER): Payer: Medicare Other | Admitting: Internal Medicine

## 2018-02-07 VITALS — BP 130/80 | HR 73 | Temp 98.2°F | Resp 16 | Ht 70.5 in | Wt 235.0 lb

## 2018-02-07 DIAGNOSIS — K219 Gastro-esophageal reflux disease without esophagitis: Secondary | ICD-10-CM | POA: Diagnosis not present

## 2018-02-07 DIAGNOSIS — J440 Chronic obstructive pulmonary disease with acute lower respiratory infection: Secondary | ICD-10-CM

## 2018-02-07 DIAGNOSIS — G4733 Obstructive sleep apnea (adult) (pediatric): Secondary | ICD-10-CM | POA: Diagnosis not present

## 2018-02-07 DIAGNOSIS — I509 Heart failure, unspecified: Secondary | ICD-10-CM | POA: Diagnosis not present

## 2018-02-07 DIAGNOSIS — J209 Acute bronchitis, unspecified: Secondary | ICD-10-CM

## 2018-02-07 LAB — POCT RAPID STREP A (OFFICE): RAPID STREP A SCREEN: NEGATIVE

## 2018-02-07 MED ORDER — LEVOFLOXACIN 500 MG PO TABS: 500.0000 mg | ORAL_TABLET | Freq: Every day | ORAL | 0 refills | Status: DC

## 2018-02-07 MED ORDER — PREDNISONE 10 MG (21) PO TBPK: ORAL_TABLET | ORAL | 0 refills | Status: DC

## 2018-02-07 NOTE — Patient Instructions (Signed)

## 2018-02-07 NOTE — Progress Notes (Signed)
[facility] [address] [place], [place] [address]  Pulmonary Sleep Medicine   Office Visit Note  Patient Name: [name] DOB: 26-Mar-1936 MRN [id number]  Date of Service: 02/07/2018  Complaints/HPI:  Patient states he has been having cough congestion and bringing up some sputum.  No hemoptysis.  Denies having any chest pain.  Sputum is thick.  He has also having runny nose.  He has not really noted any fevers.  He has been having more wheezing.  He is more short of breath.  Has been taking his hours but really helping.  He is concerned that he is getting worsened does not on end up having to go back to the hospital.  ROS  General: (-) fever, (-) chills, (-) night sweats, (-) weakness Skin: (-) rashes, (-) itching,. Eyes: (-) visual changes, (-) redness, (-) itching. Nose and Sinuses: (-) nasal stuffiness or itchiness, (-) postnasal drip, (-) nosebleeds, (-) sinus trouble. Mouth and Throat: (-) sore throat, (-) hoarseness. Neck: (-) swollen glands, (-) enlarged thyroid, (-) neck pain. Respiratory: + cough, (-) bloody sputum, + shortness of breath, + wheezing. Cardiovascular: - ankle swelling, (-) chest pain. Lymphatic: (-) lymph node enlargement. Neurologic: (-) numbness, (-) tingling. Psychiatric: (-) anxiety, (-) depression   Current Medication: Outpatient Encounter Medications as of 02/07/2018  Medication Sig  . albuterol (PROAIR HFA) 108 (90 Base) MCG/ACT inhaler Inhale 2 puffs into the lungs every 6 (six) hours as needed for wheezing or shortness of breath.  Marland Kitchen albuterol (PROVENTIL) (2.5 MG/3ML) 0.083% nebulizer solution Take 3 mLs (2.5 mg total) by nebulization every 6 (six) hours as needed for wheezing or shortness of breath.  Marland Kitchen aspirin EC 81 MG tablet Take 81 mg by mouth daily.  . Bromfenac Sodium (BROMSITE) 0.075 % SOLN Apply 1 drop to eye 2 (two) times daily.  . canagliflozin (INVOKANA) 300 MG TABS tablet Take 300 mg by mouth daily before breakfast.  .  carvedilol (COREG) 25 MG tablet Take 25 mg by mouth 2 (two) times daily with a meal.  . cholecalciferol (VITAMIN D) 1000 units tablet Take 1,000 Units by mouth at bedtime.   . Difluprednate (DUREZOL) 0.05 % EMUL Apply 1 drop to eye 2 (two) times daily.  . ergocalciferol (VITAMIN D2) 50000 units capsule Take 50,000 Units by mouth once a week.  . Exenatide ER (BYDUREON) 2 MG PEN Inject into the skin once a week.  . Fluticasone-Salmeterol (ADVAIR) 250-50 MCG/DOSE AEPB Inhale 1 puff into the lungs 2 (two) times daily.  . furosemide (LASIX) 20 MG tablet Take 20 mg by mouth 2 (two) times daily.  Marland Kitchen gabapentin (NEURONTIN) 600 MG tablet Take 600 mg by mouth 2 (two) times daily.  Marland Kitchen glipiZIDE (GLUCOTROL) 5 MG tablet Take 5 mg by mouth daily before breakfast.  . levothyroxine (SYNTHROID, LEVOTHROID) 137 MCG tablet Take 137 mcg by mouth daily before breakfast.  . magnesium oxide (MAG-OX) 400 MG tablet Take 400 mg by mouth 2 (two) times daily.  . montelukast (SINGULAIR) 10 MG tablet Take 10 mg by mouth at bedtime.  . nepafenac (ILEVRO) 0.3 % ophthalmic suspension 1 drop at bedtime.  . OXYGEN Inhale into the lungs. 2 litre at night  . pravastatin (PRAVACHOL) 20 MG tablet Take 20 mg by mouth at bedtime.  . quinapril (ACCUPRIL) 40 MG tablet Take 20 mg by mouth at bedtime.  . ranitidine (ZANTAC) 150 MG tablet Take 150 mg by mouth at bedtime.  Marland Kitchen tiotropium (SPIRIVA) 18 MCG inhalation capsule Place  18 mcg into inhaler and inhale daily.  . Travoprost, BAK Free, (TRAVATAN) 0.004 % SOLN ophthalmic solution 1 drop at bedtime.  . TRELEGY ELLIPTA 100-62.5-25 MCG/INH AEPB Inhale 1 puff into the lungs daily.   No facility-administered encounter medications on file as of 02/07/2018.     Surgical History: Past Surgical History:  Procedure Laterality Date  . CARDIAC CATHETERIZATION    . CATARACT EXTRACTION W/PHACO Right 09/01/2015   Procedure: CATARACT EXTRACTION PHACO AND INTRAOCULAR LENS PLACEMENT (IOC);  Surgeon:  Sallee Lange, MD;  Location: [facility] ORS;  Service: Ophthalmology;  Laterality: Right;  [place] 01:33 AP% 24.9 CDE 42.99 fluid pack lot # [id number]  . CATARACT EXTRACTION W/PHACO Left 09/22/2015   Procedure: CATARACT EXTRACTION PHACO AND INTRAOCULAR LENS PLACEMENT (IOC);  Surgeon: Sallee Lange, MD;  Location: [facility] ORS;  Service: Ophthalmology;  Laterality: Left;  [place] 01:28 AP% 20.9 CDE 29.54 fluid pack lot # [id number]  . CHOLECYSTECTOMY    . EYE SURGERY    . Orbital Decompression    . TONSILLECTOMY      Medical History: Past Medical History:  Diagnosis Date  . Arthritis   . Asthma   . CHF (congestive heart failure) (HCC)   . COPD (chronic obstructive pulmonary disease) (HCC)   . Diabetes mellitus without complication (HCC)   . GERD (gastroesophageal reflux disease)   . H/O Graves' disease   . H/O wheezing   . HOH (hard of hearing)   . Hypercholesterolemia   . Hypertension   . Hypothyroidism   . Lower extremity edema   . Multiple allergies   . Palpitations   . Peripheral vascular disease (HCC)    swelling of feet and legs  . Pneumonia    in the past  . PONV (postoperative nausea and vomiting)   . Shortness of breath dyspnea   . Sleep apnea    CPAP    Family History: Family History  Problem Relation Age of Onset  . Diabetes Father     Social History: Social History   Socioeconomic History  . Marital status: Married    Spouse name: Not on file  . Number of children: Not on file  . Years of education: Not on file  . Highest education level: Not on file  Occupational History  . Not on file  Social Needs  . Financial resource strain: Not on file  . Food insecurity:    Worry: Not on file    Inability: Not on file  . Transportation needs:    Medical: Not on file    Non-medical: Not on file  Tobacco Use  . Smoking status: Former Games developer  . Smokeless tobacco: Never Used  Substance and Sexual Activity  . Alcohol use: Yes    Comment: couple glasses of wine  daily  . Drug use: No  . Sexual activity: Not on file  Lifestyle  . Physical activity:    Days per week: Not on file    Minutes per session: Not on file  . Stress: Not on file  Relationships  . Social connections:    Talks on phone: Not on file    Gets together: Not on file    Attends religious service: Not on file    Active member of club or organization: Not on file    Attends meetings of clubs or organizations: Not on file    Relationship status: Not on file  . Intimate partner violence:    Fear of current or ex partner: Not on file  Emotionally abused: Not on file    Physically abused: Not on file    Forced sexual activity: Not on file  Other Topics Concern  . Not on file  Social History Narrative  . Not on file    Vital Signs: Blood pressure 130/80, pulse 73, temperature 98.2 F (36.8 C), resp. rate 16, height 5' 10.5" (1.791 m), weight 235 lb (106.6 kg), SpO2 93 %.  Examination: General Appearance: The patient is well-developed, well-nourished, and in no distress. Skin: Gross inspection of skin unremarkable. Head: normocephalic, no gross deformities. Eyes: no gross deformities noted. ENT: ears appear grossly normal no exudates. Neck: Supple. No thyromegaly. No LAD. Respiratory: scattered rhonchi noted. Cardiovascular: Normal S1 and S2 without murmur or rub. Extremities: No cyanosis. pulses are equal. Neurologic: Alert and oriented. No involuntary movements.  LABS: No results found for this or any previous visit (from the past 2160 hour(s)).  Radiology: Dg Chest 2 View  Result Date: 06/26/2016 CLINICAL DATA:  Worsening shortness of breath today. Asthma and COPD. EXAM: CHEST  2 VIEW COMPARISON:  12/16/2014 FINDINGS: The heart size and mediastinal contours are within normal limits. Aortic atherosclerosis. Pulmonary hyperinflation again demonstrated as well as diffuse pulmonary interstitial prominence, without significant change compared to prior study. No  evidence of acute infiltrate or pulmonary edema. No evidence of pneumothorax or pleural effusion. IMPRESSION: COPD.  No active cardiopulmonary disease. Electronically Signed   By: Myles Rosenthal M.D.   On: 06/26/2016 19:26    No results found.  No results found.    Assessment and Plan: Patient Active Problem List   Diagnosis Date Noted  . HYPONATREMIA, CHRONIC 03/12/2007  . PERIPHERAL NEUROPATHY 03/12/2007  . 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 HX, PERSONAL, TOBACCO USE 03/12/2007  . HYPOTHYROIDISM 03/09/2007  . DIABETES MELLITUS, TYPE II 03/09/2007  . HYPERLIPIDEMIA 03/09/2007  . HYPERTENSION 03/09/2007  . BENIGN PROSTATIC HYPERTROPHY 03/09/2007    1. COPD exacerbation  Acute exacerbation of his COPD under lung infectious process.  Patient will be given prescription for Levaquin and I have also warned him about interaction with Levaquin along with his diabetic medications to watch for hypoglycemia.  Patient also will be given now prescription for prednisone for the acute inflammatory process.  Will also get a swab for throat for bacterial and viral 2. OSA  He has been using a CPAP will be encouraged to continue using the CPAP 3. CHF.  Follow up with his cardiologist currently is compensated chest x-ray was ordered also at the x-ray that there is not a cardiac component is acute complaints 4. GERD stable at this time continue with present management.  General Counseling: I have discussed the findings of the evaluation and examination with Ruston.  I have also discussed any further diagnostic evaluation thatmay be needed or ordered today. Rhyse verbalizes understanding of the findings of todays visit. We also reviewed his medications today and discussed drug interactions and side effects including but not limited excessive drowsiness and altered mental states. We also discussed that there is always a risk not just to him but also people around him. he has been encouraged to call the office with any  questions or concerns that should arise related to todays visit.    Time spent:  I have personally obtained a history, examined the patient, evaluated laboratory and imaging results, formulated the assessment and plan and placed orders.    Yevonne Pax, MD [facility] Pulmonary and Critical Care Sleep medicine

## 2018-02-08 ENCOUNTER — Ambulatory Visit (INDEPENDENT_AMBULATORY_CARE_PROVIDER_SITE_OTHER): Payer: Medicare Other

## 2018-02-08 ENCOUNTER — Ambulatory Visit
Admission: RE | Admit: 2018-02-08 | Discharge: 2018-02-08 | Disposition: A | Discharge status: Home / Self Care | Payer: Medicare Other | Source: Ambulatory Visit | Attending: Internal Medicine | Admitting: Internal Medicine

## 2018-02-08 DIAGNOSIS — G4733 Obstructive sleep apnea (adult) (pediatric): Secondary | ICD-10-CM | POA: Diagnosis not present

## 2018-02-08 DIAGNOSIS — J440 Chronic obstructive pulmonary disease with acute lower respiratory infection: Principal | ICD-10-CM

## 2018-02-08 DIAGNOSIS — J449 Chronic obstructive pulmonary disease, unspecified: Secondary | ICD-10-CM | POA: Insufficient documentation

## 2018-02-08 DIAGNOSIS — J209 Acute bronchitis, unspecified: Secondary | ICD-10-CM

## 2018-02-08 NOTE — Progress Notes (Signed)
95 percentile pressure 12   95th percentile leak 25.3   apnea index 0.1 /hr  apnea-hypopnea index  0.5 /hr   total days used  >4 hr 90 days  total days used <4 hr 0 days  Total compliance 100 percent  [name] is very compliant on cpap. No problems or questions

## 2018-02-21 ENCOUNTER — Ambulatory Visit: Payer: Self-pay | Admitting: Internal Medicine

## 2018-05-10 ENCOUNTER — Encounter: Payer: Medicare Other | Attending: Internal Medicine | Admitting: Internal Medicine

## 2018-05-10 DIAGNOSIS — I110 Hypertensive heart disease with heart failure: Secondary | ICD-10-CM | POA: Insufficient documentation

## 2018-05-10 DIAGNOSIS — E1151 Type 2 diabetes mellitus with diabetic peripheral angiopathy without gangrene: Secondary | ICD-10-CM | POA: Insufficient documentation

## 2018-05-10 DIAGNOSIS — G4733 Obstructive sleep apnea (adult) (pediatric): Secondary | ICD-10-CM | POA: Diagnosis not present

## 2018-05-10 DIAGNOSIS — I509 Heart failure, unspecified: Secondary | ICD-10-CM | POA: Insufficient documentation

## 2018-05-10 DIAGNOSIS — Z881 Allergy status to other antibiotic agents status: Secondary | ICD-10-CM | POA: Diagnosis not present

## 2018-05-10 DIAGNOSIS — E11622 Type 2 diabetes mellitus with other skin ulcer: Secondary | ICD-10-CM | POA: Diagnosis present

## 2018-05-10 DIAGNOSIS — E1142 Type 2 diabetes mellitus with diabetic polyneuropathy: Secondary | ICD-10-CM | POA: Diagnosis not present

## 2018-05-10 DIAGNOSIS — N400 Enlarged prostate without lower urinary tract symptoms: Secondary | ICD-10-CM | POA: Insufficient documentation

## 2018-05-10 DIAGNOSIS — I872 Venous insufficiency (chronic) (peripheral): Secondary | ICD-10-CM | POA: Diagnosis not present

## 2018-05-10 DIAGNOSIS — J449 Chronic obstructive pulmonary disease, unspecified: Secondary | ICD-10-CM | POA: Diagnosis not present

## 2018-05-10 DIAGNOSIS — Z87891 Personal history of nicotine dependence: Secondary | ICD-10-CM | POA: Diagnosis not present
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
## 2018-05-13 NOTE — Progress Notes (Signed)
[name], [name] ([id number]) Visit Report for 05/10/2018 Abuse/Suicide Risk Screen Details Patient Name: [name], [name] Date of Service: 05/10/2018 8:45 AM Medical Record Number: [id number] Patient Account Number: [id number] Date of Birth/Sex: July 15, 1935 (82 y.o. M) Treating RN: Rema JasmineNg, Wendi Primary Care Terrisha Lopata: Horton ChinMORAYATI, SHAMIL Other Clinician: Referring Earnest Thalman: Horton ChinMORAYATI, SHAMIL Treating Jayzon Taras/Extender: Altamese [name], MICHAEL G Weeks in Treatment: 0 Abuse/Suicide Risk Screen Items Answer ABUSE/SUICIDE RISK SCREEN: Has anyone close to you tried to hurt or harm you recentlyo No Do you feel uncomfortable with anyone in your familyo No Has anyone forced you do things that you didnot want to doo No Do you have any thoughts of harming yourselfo No Patient displays signs or symptoms of abuse and/or neglect. No Electronic Signature(s) Signed: 05/10/2018 4:48:38 PM By: Rema JasmineNg, Wendi Entered By: Rema JasmineNg, Wendi on 05/10/2018 09:06:54 Grieves, [name] ([id number]) -------------------------------------------------------------------------------- Activities of Daily Living Details Patient Name: Linn, [name] Date of Service: 05/10/2018 8:45 AM Medical Record Number: [id number] Patient Account Number: [id number] Date of Birth/Sex: July 15, 1935 (82 y.o. M) Treating RN: Rema JasmineNg, Wendi Primary Care Fox Salminen: Horton ChinMORAYATI, SHAMIL Other Clinician: Referring Aisling Emigh: Horton ChinMORAYATI, SHAMIL Treating Santiaga Butzin/Extender: Altamese [name], MICHAEL G Weeks in Treatment: 0 Activities of Daily Living Items Answer Activities of Daily Living (Please select one for each item) Drive Automobile Completely Able Take Medications Completely Able Use Telephone Completely Able Care for Appearance Completely Able Use Toilet Completely Able Bath / Shower Completely Able Dress Self Completely Able Feed Self Completely Able Walk Completely Able Get In / Out Bed Completely Able Housework Completely Able Prepare Meals Completely Able Handle Money  Completely Able Shop for Self Completely Able Electronic Signature(s) Signed: 05/10/2018 4:48:38 PM By: Rema JasmineNg, Wendi Entered By: Rema JasmineNg, Wendi on 05/10/2018 09:07:27 Hinkson, [name] ([id number]) -------------------------------------------------------------------------------- Education Assessment Details Patient Name: Freshour, [name] Date of Service: 05/10/2018 8:45 AM Medical Record Number: [id number] Patient Account Number: [id number] Date of Birth/Sex: July 15, 1935 (82 y.o. M) Treating RN: Rema JasmineNg, Wendi Primary Care Mykel Mohl: Horton ChinMORAYATI, SHAMIL Other Clinician: Referring Fynley Chrystal: Horton ChinMORAYATI, SHAMIL Treating Keri Veale/Extender: Altamese [name], MICHAEL G Weeks in Treatment: 0 Learning Preferences/Education Level/Primary Language Learning Preference: Explanation, Demonstration Highest Education Level: College or Above Preferred Language: English Cognitive Barrier Assessment/Beliefs Language Barrier: No Translator Needed: No Memory Deficit: No Emotional Barrier: No Cultural/Religious Beliefs Affecting Medical Care: No Physical Barrier Assessment Impaired Vision: No Impaired Hearing: No Decreased Hand dexterity: No Knowledge/Comprehension Assessment Knowledge Level: High Comprehension Level: High Ability to understand written High instructions: Ability to understand verbal High instructions: Motivation Assessment Anxiety Level: Calm Cooperation: Cooperative Education Importance: Acknowledges Need Interest in Health Problems: Asks Questions Perception: Coherent Willingness to Engage in Self- High Management Activities: Readiness to Engage in Self- High Management Activities: Electronic Signature(s) Signed: 05/10/2018 4:48:38 PM By: Rema JasmineNg, Wendi Entered By: Rema JasmineNg, Wendi on 05/10/2018 09:08:12 Hoback, [name] ([id number]) -------------------------------------------------------------------------------- Fall Risk Assessment Details Patient Name: Alcott, [name] Date of Service: 05/10/2018 8:45  AM Medical Record Number: [id number] Patient Account Number: [id number] Date of Birth/Sex: July 15, 1935 (82 y.o. M) Treating RN: Rema JasmineNg, Wendi Primary Care Angelito Hopping: Horton ChinMORAYATI, SHAMIL Other Clinician: Referring Kaymen Adrian: Horton ChinMORAYATI, SHAMIL Treating Johna Kearl/Extender: Altamese [name], MICHAEL G Weeks in Treatment: 0 Fall Risk Assessment Items Have you had 2 or more falls in the last 12 monthso 0 No Have you had any fall that resulted in injury in the last 12 monthso 0 No FALL RISK ASSESSMENT: History of falling - immediate or within 3 months 0 No Secondary diagnosis 0 No Ambulatory aid None/bed rest/wheelchair/nurse 0 No Crutches/cane/walker 0 No Furniture 0 No IV Access/Saline  Lock 0 No Gait/Training Normal/bed rest/immobile 0 No Weak 0 No Impaired 0 No Mental Status Oriented to own ability 0 No Electronic Signature(s) Signed: 05/10/2018 4:48:38 PM By: Rema Jasmine Entered By: Rema Jasmine on 05/10/2018 09:08:26 Rodda, [name] ([id number]) -------------------------------------------------------------------------------- Foot Assessment Details Patient Name: Kost, [name] Date of Service: 05/10/2018 8:45 AM Medical Record Number: [id number] Patient Account Number: [id number] Date of Birth/Sex: Aug 03, 1935 (82 y.o. M) Treating RN: Rema Jasmine Primary Care Denay Pleitez: [name] Other Clinician: Referring Jaren Kearn: [name] Treating Nilan Iddings/Extender: Altamese [place] in Treatment: 0 Foot Assessment Items Site Locations + = Sensation present, - = Sensation absent, C = Callus, U = Ulcer R = Redness, W = Warmth, M = Maceration, PU = Pre-ulcerative lesion F = Fissure, S = Swelling, D = Dryness Assessment Right: Left: Other Deformity: No No Prior Foot Ulcer: No No Prior Amputation: No No Charcot Joint: No No Ambulatory Status: Ambulatory Without Help Gait: Steady Electronic Signature(s) Signed: 05/10/2018 4:48:38 PM By: Rema Jasmine Entered By: Rema Jasmine on 05/10/2018  09:13:26 Cendejas, [name] ([id number]) -------------------------------------------------------------------------------- Nutrition Risk Assessment Details Patient Name: Trevino, Valiant L. Date of Service: 05/10/2018 8:45 AM Medical Record Number: [id number] Patient Account Number: [id number] Date of Birth/Sex: 23-Nov-1935 (82 y.o. M) Treating RN: Rema Jasmine Primary Care Alizee Maple: [name] Other Clinician: Referring Ariza Evans: [name] Treating Kesley Gaffey/Extender: Altamese [place] in Treatment: 0 Height (in): 71 Weight (lbs): 238 Body Mass Index (BMI): 33.2 Nutrition Risk Assessment Items NUTRITION RISK SCREEN: I have an illness or condition that made me change the kind and/or amount of 0 No food I eat I eat fewer than two meals per day 0 No I eat few fruits and vegetables, or milk products 0 No I have three or more drinks of beer, liquor or wine almost every day 0 No I have tooth or mouth problems that make it hard for me to eat 0 No I don't always have enough money to buy the food I need 0 No I eat alone most of the time 0 No I take three or more different prescribed or over-the-counter drugs a day 0 No Without wanting to, I have lost or gained 10 pounds in the last six months 0 No I am not always physically able to shop, cook and/or feed myself 0 No Nutrition Protocols Good Risk Protocol Moderate Risk Protocol Electronic Signature(s) Signed: 05/10/2018 4:48:38 PM By: Rema Jasmine Entered ByRema Jasmine on 05/10/2018 09:08:34
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
## 2018-05-17 ENCOUNTER — Encounter: Payer: Medicare Other | Admitting: Internal Medicine

## 2018-05-17 DIAGNOSIS — E1151 Type 2 diabetes mellitus with diabetic peripheral angiopathy without gangrene: Secondary | ICD-10-CM | POA: Diagnosis not present
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
## 2018-05-24 ENCOUNTER — Encounter: Payer: Medicare Other | Admitting: Internal Medicine

## 2018-05-24 DIAGNOSIS — E1151 Type 2 diabetes mellitus with diabetic peripheral angiopathy without gangrene: Secondary | ICD-10-CM | POA: Diagnosis not present

## 2018-05-25 ENCOUNTER — Other Ambulatory Visit: Payer: Self-pay

## 2018-05-25 MED ORDER — TRELEGY ELLIPTA 100-62.5-25 MCG/INH IN AEPB: 1.0000 | INHALATION_SPRAY | Freq: Every day | RESPIRATORY_TRACT | 4 refills | Status: DC
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
## 2018-06-02 ENCOUNTER — Encounter: Payer: Medicare Other | Admitting: Family Medicine

## 2018-06-02 DIAGNOSIS — E1151 Type 2 diabetes mellitus with diabetic peripheral angiopathy without gangrene: Secondary | ICD-10-CM | POA: Diagnosis not present
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
## 2018-06-14 ENCOUNTER — Encounter: Payer: Medicare Other | Attending: Internal Medicine | Admitting: Internal Medicine

## 2018-06-14 DIAGNOSIS — E1142 Type 2 diabetes mellitus with diabetic polyneuropathy: Secondary | ICD-10-CM | POA: Insufficient documentation

## 2018-06-14 DIAGNOSIS — X062XXA Exposure to ignition of other clothing and apparel, initial encounter: Secondary | ICD-10-CM | POA: Diagnosis not present

## 2018-06-14 DIAGNOSIS — E11622 Type 2 diabetes mellitus with other skin ulcer: Secondary | ICD-10-CM | POA: Insufficient documentation

## 2018-06-14 DIAGNOSIS — T24232A Burn of second degree of left lower leg, initial encounter: Secondary | ICD-10-CM | POA: Insufficient documentation

## 2018-06-14 DIAGNOSIS — N400 Enlarged prostate without lower urinary tract symptoms: Secondary | ICD-10-CM | POA: Insufficient documentation

## 2018-06-14 DIAGNOSIS — I509 Heart failure, unspecified: Secondary | ICD-10-CM | POA: Diagnosis not present

## 2018-06-14 DIAGNOSIS — J449 Chronic obstructive pulmonary disease, unspecified: Secondary | ICD-10-CM | POA: Insufficient documentation

## 2018-06-14 DIAGNOSIS — G4733 Obstructive sleep apnea (adult) (pediatric): Secondary | ICD-10-CM | POA: Diagnosis not present

## 2018-06-14 DIAGNOSIS — I872 Venous insufficiency (chronic) (peripheral): Secondary | ICD-10-CM | POA: Diagnosis not present
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
## 2018-06-21 ENCOUNTER — Encounter: Payer: Medicare Other | Admitting: Internal Medicine

## 2018-06-21 DIAGNOSIS — T24232A Burn of second degree of left lower leg, initial encounter: Secondary | ICD-10-CM | POA: Diagnosis not present
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
## 2018-06-22 NOTE — Progress Notes (Signed)
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 Range: 80 - 120 mg / dl Electronic Signature(s) Signed: 06/21/2018 12:44:57 PM By: Dayton Martes RCP, RRT, CHT Entered By: Dayton Martes on 06/21/2018 10:04:31

## 2018-06-28 ENCOUNTER — Encounter: Payer: Medicare Other | Admitting: Internal Medicine

## 2018-06-28 DIAGNOSIS — T24232A Burn of second degree of left lower leg, initial encounter: Secondary | ICD-10-CM | POA: Diagnosis not present
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
## 2018-07-05 ENCOUNTER — Encounter: Payer: Medicare Other | Admitting: Internal Medicine

## 2018-07-05 DIAGNOSIS — T24232A Burn of second degree of left lower leg, initial encounter: Secondary | ICD-10-CM | POA: Diagnosis not present
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
## 2018-07-12 ENCOUNTER — Encounter: Payer: Medicare Other | Attending: Internal Medicine | Admitting: Internal Medicine

## 2018-07-12 DIAGNOSIS — G4733 Obstructive sleep apnea (adult) (pediatric): Secondary | ICD-10-CM | POA: Insufficient documentation

## 2018-07-12 DIAGNOSIS — E1151 Type 2 diabetes mellitus with diabetic peripheral angiopathy without gangrene: Secondary | ICD-10-CM | POA: Diagnosis not present

## 2018-07-12 DIAGNOSIS — E11622 Type 2 diabetes mellitus with other skin ulcer: Secondary | ICD-10-CM | POA: Insufficient documentation

## 2018-07-12 DIAGNOSIS — J449 Chronic obstructive pulmonary disease, unspecified: Secondary | ICD-10-CM | POA: Insufficient documentation

## 2018-07-12 DIAGNOSIS — T24202D Burn of second degree of unspecified site of left lower limb, except ankle and foot, subsequent encounter: Secondary | ICD-10-CM | POA: Diagnosis not present

## 2018-07-12 DIAGNOSIS — I509 Heart failure, unspecified: Secondary | ICD-10-CM | POA: Insufficient documentation

## 2018-07-12 DIAGNOSIS — E1142 Type 2 diabetes mellitus with diabetic polyneuropathy: Secondary | ICD-10-CM | POA: Insufficient documentation
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
## 2018-07-19 ENCOUNTER — Encounter: Payer: Medicare Other | Admitting: Internal Medicine

## 2018-07-19 DIAGNOSIS — E11622 Type 2 diabetes mellitus with other skin ulcer: Secondary | ICD-10-CM | POA: Diagnosis not present
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
## 2018-07-25 ENCOUNTER — Ambulatory Visit (INDEPENDENT_AMBULATORY_CARE_PROVIDER_SITE_OTHER): Payer: Medicare Other | Admitting: Internal Medicine

## 2018-07-25 ENCOUNTER — Encounter: Payer: Self-pay | Admitting: Internal Medicine

## 2018-07-25 VITALS — BP 130/70 | HR 62 | Resp 16 | Ht 70.5 in | Wt 234.0 lb

## 2018-07-25 DIAGNOSIS — K219 Gastro-esophageal reflux disease without esophagitis: Secondary | ICD-10-CM

## 2018-07-25 DIAGNOSIS — G4733 Obstructive sleep apnea (adult) (pediatric): Secondary | ICD-10-CM | POA: Diagnosis not present

## 2018-07-25 DIAGNOSIS — R0602 Shortness of breath: Secondary | ICD-10-CM | POA: Diagnosis not present

## 2018-07-25 DIAGNOSIS — Z9989 Dependence on other enabling machines and devices: Secondary | ICD-10-CM

## 2018-07-25 DIAGNOSIS — J449 Chronic obstructive pulmonary disease, unspecified: Secondary | ICD-10-CM

## 2018-07-25 MED ORDER — TRELEGY ELLIPTA 100-62.5-25 MCG/INH IN AEPB: 1.0000 | INHALATION_SPRAY | Freq: Every day | RESPIRATORY_TRACT | 2 refills | Status: DC

## 2018-07-25 NOTE — Progress Notes (Signed)
[facility] [address] [place], [place] [address]  Pulmonary Sleep Medicine   Office Visit Note  Patient Name: [name] DOB: 30-Oct-1935 MRN [id number]  Date of Service: 07/25/2018  Complaints/HPI: Pt is here for follow up on OSA, copd.  Pt reports excellent results with his cpap, he has good relief of symptoms. He is using a so clean machine to clean it.  He also is changing his tubing and mask seal as indicated. He reports using his trelggy inhaler daily and has excellent results. He denies any hospitalizations recently, and reports his breathing has been doing well.  He is very happy with his Trelegy inhaler.      ROS  General: (-) fever, (-) chills, (-) night sweats, (-) weakness Skin: (-) rashes, (-) itching,. Eyes: (-) visual changes, (-) redness, (-) itching. Nose and Sinuses: (-) nasal stuffiness or itchiness, (-) postnasal drip, (-) nosebleeds, (-) sinus trouble. Mouth and Throat: (-) sore throat, (-) hoarseness. Neck: (-) swollen glands, (-) enlarged thyroid, (-) neck pain. Respiratory: - cough, (-) bloody sputum, - shortness of breath, - wheezing. Cardiovascular: - ankle swelling, (-) chest pain. Lymphatic: (-) lymph node enlargement. Neurologic: (-) numbness, (-) tingling. Psychiatric: (-) anxiety, (-) depression   Current Medication: Outpatient Encounter Medications as of 07/25/2018  Medication Sig  . albuterol (PROAIR HFA) 108 (90 Base) MCG/ACT inhaler Inhale 2 puffs into the lungs every 6 (six) hours as needed for wheezing or shortness of breath.  Marland Kitchen albuterol (PROVENTIL) (2.5 MG/3ML) 0.083% nebulizer solution Take 3 mLs (2.5 mg total) by nebulization every 6 (six) hours as needed for wheezing or shortness of breath.  Marland Kitchen aspirin EC 81 MG tablet Take 81 mg by mouth daily.  . Bromfenac Sodium (BROMSITE) 0.075 % SOLN Apply 1 drop to eye 2 (two) times daily.  . canagliflozin (INVOKANA) 300 MG TABS tablet Take 300 mg by mouth daily before breakfast.  .  carvedilol (COREG) 25 MG tablet Take 25 mg by mouth 2 (two) times daily with a meal.  . cholecalciferol (VITAMIN D) 1000 units tablet Take 1,000 Units by mouth at bedtime.   . Difluprednate (DUREZOL) 0.05 % EMUL Apply 1 drop to eye 2 (two) times daily.  . ergocalciferol (VITAMIN D2) 50000 units capsule Take 50,000 Units by mouth once a week.  . Exenatide ER (BYDUREON) 2 MG PEN Inject into the skin once a week.  . Fluticasone-Salmeterol (ADVAIR) 250-50 MCG/DOSE AEPB Inhale 1 puff into the lungs 2 (two) times daily.  . furosemide (LASIX) 20 MG tablet Take 20 mg by mouth 2 (two) times daily.  Marland Kitchen gabapentin (NEURONTIN) 600 MG tablet Take 600 mg by mouth 2 (two) times daily.  Marland Kitchen glipiZIDE (GLUCOTROL) 5 MG tablet Take 5 mg by mouth daily before breakfast.  . levofloxacin (LEVAQUIN) 500 MG tablet Take 1 tablet (500 mg total) by mouth daily.  Marland Kitchen levothyroxine (SYNTHROID, LEVOTHROID) 137 MCG tablet Take 137 mcg by mouth daily before breakfast.  . magnesium oxide (MAG-OX) 400 MG tablet Take 400 mg by mouth 2 (two) times daily.  . montelukast (SINGULAIR) 10 MG tablet Take 10 mg by mouth at bedtime.  . nepafenac (ILEVRO) 0.3 % ophthalmic suspension 1 drop at bedtime.  . OXYGEN Inhale into the lungs. 2 litre at night  . pravastatin (PRAVACHOL) 20 MG tablet Take 20 mg by mouth at bedtime.  . predniSONE (STERAPRED UNI-PAK 21 TAB) 10 MG (21) TBPK tablet As directed  . quinapril (ACCUPRIL) 40 MG tablet Take 20 mg by  mouth at bedtime.  . ranitidine (ZANTAC) 150 MG tablet Take 150 mg by mouth at bedtime.  Marland Kitchen tiotropium (SPIRIVA) 18 MCG inhalation capsule Place 18 mcg into inhaler and inhale daily.  . Travoprost, BAK Free, (TRAVATAN) 0.004 % SOLN ophthalmic solution 1 drop at bedtime.  . TRELEGY ELLIPTA 100-62.5-25 MCG/INH AEPB Inhale 1 puff into the lungs daily.  . [DISCONTINUED] TRELEGY ELLIPTA 100-62.5-25 MCG/INH AEPB Inhale 1 puff into the lungs daily.   No facility-administered encounter medications on file as  of 07/25/2018.     Surgical History: Past Surgical History:  Procedure Laterality Date  . CARDIAC CATHETERIZATION    . CATARACT EXTRACTION W/PHACO Right 09/01/2015   Procedure: CATARACT EXTRACTION PHACO AND INTRAOCULAR LENS PLACEMENT (IOC);  Surgeon: Sallee Lange, MD;  Location: [facility] ORS;  Service: Ophthalmology;  Laterality: Right;  [place] 01:33 AP% 24.9 CDE 42.99 fluid pack lot # [id number]  . CATARACT EXTRACTION W/PHACO Left 09/22/2015   Procedure: CATARACT EXTRACTION PHACO AND INTRAOCULAR LENS PLACEMENT (IOC);  Surgeon: Sallee Lange, MD;  Location: [facility] ORS;  Service: Ophthalmology;  Laterality: Left;  [place] 01:28 AP% 20.9 CDE 29.54 fluid pack lot # [id number]  . CHOLECYSTECTOMY    . EYE SURGERY    . Orbital Decompression    . TONSILLECTOMY      Medical History: Past Medical History:  Diagnosis Date  . Arthritis   . Asthma   . CHF (congestive heart failure) (HCC)   . COPD (chronic obstructive pulmonary disease) (HCC)   . Diabetes mellitus without complication (HCC)   . GERD (gastroesophageal reflux disease)   . H/O Graves' disease   . H/O wheezing   . HOH (hard of hearing)   . Hypercholesterolemia   . Hypertension   . Hypothyroidism   . Lower extremity edema   . Multiple allergies   . Palpitations   . Peripheral vascular disease (HCC)    swelling of feet and legs  . Pneumonia    in the past  . PONV (postoperative nausea and vomiting)   . Shortness of breath dyspnea   . Sleep apnea    CPAP    Family History: Family History  Problem Relation Age of Onset  . Diabetes Father     Social History: Social History   Socioeconomic History  . Marital status: Married    Spouse name: Not on file  . Number of children: Not on file  . Years of education: Not on file  . Highest education level: Not on file  Occupational History  . Not on file  Social Needs  . Financial resource strain: Not on file  . Food insecurity:    Worry: Not on file    Inability: Not on  file  . Transportation needs:    Medical: Not on file    Non-medical: Not on file  Tobacco Use  . Smoking status: Former Games developer  . Smokeless tobacco: Never Used  Substance and Sexual Activity  . Alcohol use: Yes    Comment: couple glasses of wine daily  . Drug use: No  . Sexual activity: Not on file  Lifestyle  . Physical activity:    Days per week: Not on file    Minutes per session: Not on file  . Stress: Not on file  Relationships  . Social connections:    Talks on phone: Not on file    Gets together: Not on file    Attends religious service: Not on file    Active member of club or  organization: Not on file    Attends meetings of clubs or organizations: Not on file    Relationship status: Not on file  . Intimate partner violence:    Fear of current or ex partner: Not on file    Emotionally abused: Not on file    Physically abused: Not on file    Forced sexual activity: Not on file  Other Topics Concern  . Not on file  Social History Narrative  . Not on file    Vital Signs: Blood pressure 130/70, pulse 62, resp. rate 16, height 5' 10.5" (1.791 m), weight 234 lb (106.1 kg), SpO2 95 %.  Examination: General Appearance: The patient is well-developed, well-nourished, and in no distress. Skin: Gross inspection of skin unremarkable. Head: normocephalic, no gross deformities. Eyes: no gross deformities noted. ENT: ears appear grossly normal no exudates. Neck: Supple. No thyromegaly. No LAD. Respiratory: clear bilateraly. Cardiovascular: Normal S1 and S2 without murmur or rub. Extremities: No cyanosis. pulses are equal. Neurologic: Alert and oriented. No involuntary movements.  LABS: No results found for this or any previous visit (from the past 2160 hour(s)).  Radiology: Dg Chest 2 View  Result Date: 02/08/2018 CLINICAL DATA:  Acute bronchitis with COPD EXAM: CHEST - 2 VIEW COMPARISON:  06/26/2016 FINDINGS: COPD with pulmonary hyperinflation and prominent bibasilar  lung markings. No acute infiltrate or effusion. Negative for heart failure. IMPRESSION: COPD with hyperinflation and prominent markings. No acute abnormality. Electronically Signed   By: Marlan Palauharles  Clark M.D.   On: 02/08/2018 14:20    No results found.  No results found.    Assessment and Plan: Patient Active Problem List   Diagnosis Date Noted  . HYPONATREMIA, CHRONIC 03/12/2007  . PERIPHERAL NEUROPATHY 03/12/2007  . 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 HX, PERSONAL, TOBACCO USE 03/12/2007  . HYPOTHYROIDISM 03/09/2007  . DIABETES MELLITUS, TYPE II 03/09/2007  . HYPERLIPIDEMIA 03/09/2007  . HYPERTENSION 03/09/2007  . BENIGN PROSTATIC HYPERTROPHY 03/09/2007   1. OSA on CPAP Patient will continue using CPAP as prescribed.  He reports good results when using the machine.  He denies any concerns.   2. Chronic obstructive pulmonary disease, unspecified COPD type (HCC) Continue to use Trelegy inhaler as prescribed.  Patient reports good results using this medication and is happy to only be taking 1 inhaler. - TRELEGY ELLIPTA 100-62.5-25 MCG/INH AEPB; Inhale 1 puff into the lungs daily.  Dispense: 3 each; Refill: 2  3. Gastroesophageal reflux disease without esophagitis Stable, continue current medication.  4. Shortness of breath FVC is 2.0 which is 49% of the pre-predicted value. FEV1 is 1.0 which is 31% of the pre-predicted value FEV1/FVC is 50% which is 70% of the pre-predicted value on today's spirometry. - Spirometry with Graph   General Counseling: I have discussed the findings of the evaluation and examination with Kiyoshi.  I have also discussed any further diagnostic evaluation thatmay be needed or ordered today. Kalonji verbalizes understanding of the findings of todays visit. We also reviewed his medications today and discussed drug interactions and side effects including but not limited excessive drowsiness and altered mental states. We also discussed that there is always a risk not  just to him but also people around him. he has been encouraged to call the office with any questions or concerns that should arise related to todays visit.    Time spent: 25 This patient was seen by Blima LedgerAdam Alando Colleran AGNP-C in Collaboration with Dr. Freda MunroSaadat Khan as a part of collaborative care agreement.   I  have personally obtained a history, examined the patient, evaluated laboratory and imaging results, formulated the assessment and plan and placed orders.    Saadat A Khan, MD FCCP Pulmonary and Critical Care Sleep medicine 

## 2018-07-25 NOTE — Patient Instructions (Signed)

## 2018-07-26 ENCOUNTER — Encounter: Payer: Medicare Other | Admitting: Internal Medicine

## 2018-07-26 DIAGNOSIS — E11622 Type 2 diabetes mellitus with other skin ulcer: Secondary | ICD-10-CM | POA: Diagnosis not present

## 2018-08-01 ENCOUNTER — Ambulatory Visit: Payer: Self-pay | Admitting: Internal Medicine
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
## 2018-08-02 ENCOUNTER — Encounter: Payer: Medicare Other | Admitting: Internal Medicine

## 2018-08-02 DIAGNOSIS — E11622 Type 2 diabetes mellitus with other skin ulcer: Secondary | ICD-10-CM | POA: Diagnosis not present
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
## 2018-08-09 ENCOUNTER — Encounter: Payer: Medicare Other | Attending: Internal Medicine | Admitting: Internal Medicine

## 2018-08-09 DIAGNOSIS — I509 Heart failure, unspecified: Secondary | ICD-10-CM | POA: Diagnosis not present

## 2018-08-09 DIAGNOSIS — E1142 Type 2 diabetes mellitus with diabetic polyneuropathy: Secondary | ICD-10-CM | POA: Insufficient documentation

## 2018-08-09 DIAGNOSIS — X19XXXD Contact with other heat and hot substances, subsequent encounter: Secondary | ICD-10-CM | POA: Insufficient documentation

## 2018-08-09 DIAGNOSIS — T24232D Burn of second degree of left lower leg, subsequent encounter: Secondary | ICD-10-CM | POA: Insufficient documentation

## 2018-08-09 DIAGNOSIS — J449 Chronic obstructive pulmonary disease, unspecified: Secondary | ICD-10-CM | POA: Diagnosis not present

## 2018-08-09 DIAGNOSIS — E11622 Type 2 diabetes mellitus with other skin ulcer: Secondary | ICD-10-CM | POA: Insufficient documentation

## 2018-08-09 DIAGNOSIS — G4733 Obstructive sleep apnea (adult) (pediatric): Secondary | ICD-10-CM | POA: Diagnosis not present
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
## 2018-08-16 ENCOUNTER — Other Ambulatory Visit: Payer: Self-pay

## 2018-08-16 ENCOUNTER — Encounter: Payer: Medicare Other | Admitting: Internal Medicine

## 2018-08-16 DIAGNOSIS — E11622 Type 2 diabetes mellitus with other skin ulcer: Secondary | ICD-10-CM | POA: Diagnosis not present
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
## 2018-08-23 ENCOUNTER — Other Ambulatory Visit: Payer: Self-pay

## 2018-08-23 ENCOUNTER — Encounter: Payer: Medicare Other | Admitting: Internal Medicine

## 2018-08-23 DIAGNOSIS — E11622 Type 2 diabetes mellitus with other skin ulcer: Secondary | ICD-10-CM | POA: Diagnosis not present
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
## 2018-08-30 ENCOUNTER — Encounter: Payer: Medicare Other | Admitting: Internal Medicine

## 2018-08-30 ENCOUNTER — Other Ambulatory Visit: Payer: Self-pay

## 2018-08-30 ENCOUNTER — Ambulatory Visit (INDEPENDENT_AMBULATORY_CARE_PROVIDER_SITE_OTHER): Payer: Medicare Other

## 2018-08-30 DIAGNOSIS — G4733 Obstructive sleep apnea (adult) (pediatric): Secondary | ICD-10-CM | POA: Diagnosis not present

## 2018-08-30 DIAGNOSIS — E11622 Type 2 diabetes mellitus with other skin ulcer: Secondary | ICD-10-CM | POA: Diagnosis not present

## 2018-08-30 NOTE — Progress Notes (Signed)
95 percentile pressure 12   95th percentile leak 28.9   apnea index 0.2 /hr  apnea-hypopnea index  0.6 /hr   total days used  >4 hr 90 days  total days used <4 hr 0 days  Total compliance 100 percent  [name] is doing great no problems or questions

## 2018-08-31 DIAGNOSIS — E11622 Type 2 diabetes mellitus with other skin ulcer: Secondary | ICD-10-CM | POA: Diagnosis not present
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
## 2018-09-06 ENCOUNTER — Other Ambulatory Visit: Payer: Self-pay

## 2018-09-06 ENCOUNTER — Encounter: Payer: Medicare Other | Attending: Internal Medicine | Admitting: Internal Medicine

## 2018-09-06 DIAGNOSIS — E1151 Type 2 diabetes mellitus with diabetic peripheral angiopathy without gangrene: Secondary | ICD-10-CM | POA: Insufficient documentation

## 2018-09-06 DIAGNOSIS — T24232D Burn of second degree of left lower leg, subsequent encounter: Secondary | ICD-10-CM | POA: Insufficient documentation

## 2018-09-06 DIAGNOSIS — X088XXD Exposure to other specified smoke, fire and flames, subsequent encounter: Secondary | ICD-10-CM | POA: Insufficient documentation

## 2018-09-06 DIAGNOSIS — T310 Burns involving less than 10% of body surface: Secondary | ICD-10-CM | POA: Diagnosis not present

## 2018-09-06 DIAGNOSIS — E11622 Type 2 diabetes mellitus with other skin ulcer: Secondary | ICD-10-CM | POA: Insufficient documentation
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
## 2018-09-07 ENCOUNTER — Ambulatory Visit: Payer: Self-pay | Admitting: Physician Assistant
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
## 2018-09-13 ENCOUNTER — Encounter: Payer: Medicare Other | Admitting: Internal Medicine

## 2018-09-13 ENCOUNTER — Other Ambulatory Visit: Payer: Self-pay

## 2018-09-13 DIAGNOSIS — T24232D Burn of second degree of left lower leg, subsequent encounter: Secondary | ICD-10-CM | POA: Diagnosis not present

## 2018-09-13 NOTE — Progress Notes (Signed)
[name] ([id number]) Visit Report for 09/13/2018 Arrival Information Details Patient Name: [name], [name]. Date of Service: 09/13/2018 11:00 AM Medical Record Number: [id number] Patient Account Number: [id number] Date of Birth/Sex: 07-14-1935 (83 y.o. M) Treating [name]: [name] Primary Care [name]: [name] Other Clinician: Referring [name]: [name] Treating [name]: [name] in Treatment: 18 Visit Information History Since Last Visit Added or deleted any medications: No Patient Arrived: Ambulatory Any new allergies or adverse reactions: No Arrival Time: 11:29 Had a fall or experienced change in No Accompanied By: self activities of daily living that may affect Transfer Assistance: None risk of falls: Patient Identification Verified: Yes Signs or symptoms of abuse/neglect since last visito No Secondary Verification Process Completed: Yes Hospitalized since last visit: No Implantable device outside of the clinic excluding No cellular tissue based products placed in the center since last visit: Has Dressing in Place as Prescribed: Yes Has Compression in Place as Prescribed: Yes Pain Present Now: No Electronic Signature(s) Signed: 09/13/2018 2:47:29 PM By: [name] Entered By: [name] on 09/13/2018 11:29:58 [name] ([id number]) -------------------------------------------------------------------------------- Clinic Level of Care Assessment Details Patient Name: [name], [name] Date of Service: 09/13/2018 11:00 AM Medical Record Number: [id number] Patient Account Number: [id number] Date of Birth/Sex: 28-Aug-1935 (83 y.o. M) Treating [name]: [name] Primary Care [name]: [name] Other Clinician: Referring [name]: [name] Treating Elyanna Wallick/Extender: [name] in Treatment: 18 Clinic Level of Care Assessment Items TOOL 4 Quantity Score []  - Use when only an EandM is performed  on FOLLOW-UP visit 0 ASSESSMENTS - Nursing Assessment / Reassessment []  - Reassessment of Co-morbidities (includes updates in patient status) 0 X- 1 5 Reassessment of Adherence to Treatment Plan ASSESSMENTS - Wound and Skin Assessment / Reassessment X - Simple Wound Assessment / Reassessment - one wound 1 5 []  - 0 Complex Wound Assessment / Reassessment - multiple wounds []  - 0 Dermatologic / Skin Assessment (not related to wound area) ASSESSMENTS - Focused Assessment []  - Circumferential Edema Measurements - multi extremities 0 []  - 0 Nutritional Assessment / Counseling / Intervention []  - 0 Lower Extremity Assessment (monofilament, tuning fork, pulses) []  - 0 Peripheral Arterial Disease Assessment (using hand held doppler) ASSESSMENTS - Ostomy and/or Continence Assessment and Care []  - Incontinence Assessment and Management 0 []  - 0 Ostomy Care Assessment and Management (repouching, etc.) PROCESS - Coordination of Care X - Simple Patient / Family Education for ongoing care 1 15 []  - 0 Complex (extensive) Patient / Family Education for ongoing care []  - 0 Staff obtains Chiropractor, Records, Test Results / Process Orders []  - 0 Staff telephones HHA, Nursing Homes / Clarify orders / etc []  - 0 Routine Transfer to another Facility (non-emergent condition) []  - 0 Routine Hospital Admission (non-emergent condition) []  - 0 New Admissions / Manufacturing engineer / Ordering NPWT, Apligraf, etc. []  - 0 Emergency Hospital Admission (emergent condition) X- 1 10 Simple Discharge Coordination [name], [name] ([id number]) []  - 0 Complex (extensive) Discharge Coordination PROCESS - Special Needs []  - Pediatric / Minor Patient Management 0 []  - 0 Isolation Patient Management []  - 0 Hearing / Language / Visual special needs []  - 0 Assessment of Community assistance (transportation, D/C planning, etc.) []  - 0 Additional assistance / Altered mentation []  - 0 Support Surface(s)  Assessment (bed, cushion, seat, etc.) INTERVENTIONS - Wound Cleansing / Measurement X - Simple Wound Cleansing - one wound 1 5 []  - 0 Complex Wound Cleansing - multiple  wounds X- 1 5 Wound Imaging (photographs - any number of wounds)  - 0 Wound Tracing (instead of photographs) X- 1 5 Simple Wound Measurement - one wound  - 0 Complex Wound Measurement - multiple wounds INTERVENTIONS - Wound Dressings  - Small Wound Dressing one or multiple wounds 0  - 0 Medium Wound Dressing one or multiple wounds  - 0 Large Wound Dressing one or multiple wounds  - 0 Application of Medications - topical  - 0 Application of Medications - injection INTERVENTIONS - Miscellaneous  - External ear exam 0  - 0 Specimen Collection (cultures, biopsies, blood, body fluids, etc.)  - 0 Specimen(s) / Culture(s) sent or taken to Lab for analysis  - 0 Patient Transfer (multiple staff / Nurse, adult / Similar devices)  - 0 Simple Staple / Suture removal (25 or less)  - 0 Complex Staple / Suture removal (26 or more)  - 0 Hypo / Hyperglycemic Management (close monitor of Blood Glucose)  - 0 Ankle / Brachial Index (ABI) - do not check if billed separately X- 1 5 Vital Signs [name], [name] ([id number]) Has the patient been seen at the hospital within the last three years: Yes Total Score: 55 Level Of Care: New/Established - Level 2 Electronic Signature(s) Signed: 09/13/2018 4:47:28 PM By: [name], BSN, [name], CWS, [name], BSN Entered By: [name], BSN, [name], CWS, Aaron on 09/13/2018 11:56:22 Buckle, [name] ([id number]) -------------------------------------------------------------------------------- Encounter Discharge Information Details Patient Name: [name], [name] Date of Service: 09/13/2018 11:00 AM Medical Record Number: [id number] Patient Account Number: [id number] Date of Birth/Sex: July 24, 1935 (83 y.o. M) Treating [name]: [name] Primary Care [name]: [name] Other  Clinician: Referring [name]: [name] Treating Ahlana Slaydon/Extender: Aaron [place] in Treatment: 18 Encounter Discharge Information Items Discharge Condition: Stable Ambulatory Status: Ambulatory Discharge Destination: Home Transportation: Private Auto Accompanied By: self Schedule Follow-up Appointment: Yes Clinical Summary of Care: Electronic Signature(s) Signed: 09/13/2018 4:47:28 PM By: [name], BSN, [name], CWS, [name], BSN Entered By: [name], BSN, [name], CWS, Aaron on 09/13/2018 11:57:30 Jaye, [name] ([id number]) -------------------------------------------------------------------------------- Lower Extremity Assessment Details Patient Name: [name], [name] Date of Service: 09/13/2018 11:00 AM Medical Record Number: [id number] Patient Account Number: [id number] Date of Birth/Sex: July 26, 1935 (83 y.o. M) Treating [name]: [name] Primary Care Aybree Lanyon: [name] Other Clinician: Referring Nashay Brickley: [name] Treating Mirakle Tomlin/Extender: Aaron [place] in Treatment: 18 Edema Assessment Assessed: [Left: No] [Right: No] Edema: [Left: N] [Right: o] Calf Left: Right: Point of Measurement: 33 cm From Medial Instep 35 cm cm Ankle Left: Right: Point of Measurement: 12 cm From Medial Instep 23.2 cm cm Vascular Assessment Pulses: Dorsalis Pedis Palpable: [Left:Yes] Electronic Signature(s) Signed: 09/13/2018 2:47:29 PM By: [name] Entered By: [name] on 09/13/2018 11:35:42 Hild, [name] ([id number]) -------------------------------------------------------------------------------- Multi Wound Chart Details Patient Name: [name], [name] Date of Service: 09/13/2018 11:00 AM Medical Record Number: [id number] Patient Account Number: [id number] Date of Birth/Sex: Jun 13, 1935 (83 y.o. M) Treating [name]: [name] Primary Care Kahealani Yankovich: [name] Other Clinician: Referring Kyra Laffey: [name] Treating Shabree Tebbetts/Extender: Aaron [place] in Treatment: 18 Vital Signs Height(in): 71 Pulse(bpm): 54 Weight(lbs): 238 Blood Pressure(mmHg): 146/61 Body Mass Index(BMI): 33 Temperature(F): 97.7 Respiratory Rate 16 (breaths/min): Photos: [N/A:N/A] Wound Location: Left Lower Leg N/A N/A Wounding Event: Thermal Burn N/A N/A Primary Etiology: 2nd degree Burn N/A N/A Comorbid History: Cataracts, Chronic Obstructive N/A N/A Pulmonary Disease (COPD), Sleep Apnea, Congestive Heart Failure, Peripheral Arterial Disease, Type II Diabetes Date Acquired: 04/25/2018  N/A N/A Weeks of Treatment: 18 N/A N/A Wound Status: Open N/A N/A Measurements L x W x D 0x0x0 N/A N/A (cm) Area (cm) : 0 N/A N/A Volume (cm) : 0 N/A N/A % Reduction in Area: 100.00% N/A N/A % Reduction in Volume: 100.00% N/A N/A Classification: Full Thickness Without N/A N/A Exposed Support Structures Exudate Amount: None Present N/A N/A Wound Margin: Flat and Intact N/A N/A Granulation Amount: None Present (0%) N/A N/A Necrotic Amount: None Present (0%) N/A N/A Exposed Structures: Fascia: No N/A N/A Fat Layer (Subcutaneous Tissue) Exposed: No Tendon: No Muscle: No [name], [name] ([id number]) Joint: No Bone: No Limited to Skin Breakdown Epithelialization: Large (67-100%) N/A N/A Periwound Skin Texture: Scarring: Yes N/A N/A Excoriation: No Induration: No Callus: No Crepitus: No Rash: No Periwound Skin Moisture: Maceration: No N/A N/A Dry/Scaly: No Periwound Skin Color: Erythema: Yes N/A N/A Hemosiderin Staining: Yes Atrophie Blanche: No Cyanosis: No Ecchymosis: No Mottled: No Pallor: No Rubor: No Erythema Location: Circumferential N/A N/A Temperature: No Abnormality N/A N/A Tenderness on Palpation: No N/A N/A Treatment Notes Electronic Signature(s) Signed: 09/13/2018 3:44:34 PM By: Baltazar Najjar MD Entered By: Baltazar Najjar on 09/13/2018 13:12:57 Glassner, [name]  ([id number]) -------------------------------------------------------------------------------- Multi-Disciplinary Care Plan Details Patient Name: Pelphrey, [name]. Date of Service: 09/13/2018 11:00 AM Medical Record Number: [id number] Patient Account Number: [id number] Date of Birth/Sex: [birth year] (83 y.o. M) Treating [name]: [name] Primary Care Evan Mackie: [name] Other Clinician: Referring Donia Yokum: [name] Treating Branton Einstein/Extender: Aaron [place] in Treatment: 18 Active Inactive Electronic Signature(s) Signed: 09/13/2018 4:47:28 PM By: [name], BSN, [name], CWS, [name], BSN Entered By: [name], BSN, [name], CWS, Aaron on 09/13/2018 11:46:54 Campusano, [name] ([id number]) -------------------------------------------------------------------------------- Pain Assessment Details Patient Name: Sullenberger, Tarvaris L. Date of Service: 09/13/2018 11:00 AM Medical Record Number: [id number] Patient Account Number: [id number] Date of Birth/Sex: 07/09/1935 (83 y.o. M) Treating [name]: [name] Primary Care Jasimine Simms: [name] Other Clinician: Referring Falicia Lizotte: [name] Treating Linell Meldrum/Extender: Aaron [place] in Treatment: 18 Active Problems Location of Pain Severity and Description of Pain Patient Has Paino No Site Locations Pain Management and Medication Current Pain Management: Electronic Signature(s) Signed: 09/13/2018 2:47:29 PM By: [name] Entered By: [name] on 09/13/2018 11:30:06 Blasdell, [name] ([id number]) -------------------------------------------------------------------------------- Patient/Caregiver Education Details Patient Name: [name], [name] Date of Service: 09/13/2018 11:00 AM Medical Record Number: [id number] Patient Account Number: [id number] Date of Birth/Gender: 05-12-1936 (83 y.o. M) Treating [name]: [name] Primary Care Physician: [name] Other Clinician: Referring Physician: [name] Treating  Physician/Extender: Aaron [place] in Treatment: 18 Education Assessment Education Provided To: Patient Education Topics Provided Wound/Skin Impairment: Handouts: Other: lotion skin daily Electronic Signature(s) Signed: 09/13/2018 4:47:28 PM By: [name], BSN, [name], CWS, [name], BSN Entered By: [name], BSN, [name], CWS, Aaron on 09/13/2018 11:56:47 Dullea, [name] ([id number]) -------------------------------------------------------------------------------- Wound Assessment Details Patient Name: [name], [name] Date of Service: 09/13/2018 11:00 AM Medical Record Number: [id number] Patient Account Number: [id number] Date of Birth/Sex: 01/30/1936 (83 y.o. M) Treating [name]: [name] Primary Care Younique Casad: [name] Other Clinician: Referring Nikash Mortensen: [name] Treating Dossie Swor/Extender: Aaron  in Treatment: 18 Wound Status Wound Number: 1 Primary 2nd degree Burn Etiology: Wound Location: Left Lower Leg Wound Open Wounding Event: Thermal Burn Status: Date Acquired: 04/25/2018 Comorbid Cataracts, Chronic Obstructive Pulmonary Weeks Of Treatment: 18 History: Disease (COPD), Sleep Apnea, Congestive Clustered Wound: No Heart Failure, Peripheral Arterial Disease, Type II Diabetes Photos Wound Measurements Length: (cm) 0 %  Reduc Width: (cm) 0 % Reduc Depth: (cm) 0 Epithel Area: (cm) 0 Tunnel Volume: (cm) 0 Underm tion in Area: 100% tion in Volume: 100% ialization: Large (67-100%) ing: No ining: No Wound Description Full Thickness Without Exposed Support Foul Od Classification: Structures Slough/ Wound Margin: Flat and Intact Exudate None Present Amount: or After Cleansing: No Fibrino No Wound Bed Granulation Amount: None Present (0%) Exposed Structure Necrotic Amount: None Present (0%) Fascia Exposed: No Fat Layer (Subcutaneous Tissue) Exposed: No Tendon Exposed: No Muscle Exposed: No Joint Exposed: No Bone Exposed: No Limited  to Skin Breakdown [name], [name] ([id number]) Periwound Skin Texture Texture Color No Abnormalities Noted: No No Abnormalities Noted: No Callus: No Atrophie Blanche: No Crepitus: No Cyanosis: No Excoriation: No Ecchymosis: No Induration: No Erythema: Yes Rash: No Erythema Location: Circumferential Scarring: Yes Hemosiderin Staining: Yes Mottled: No Moisture Pallor: No No Abnormalities Noted: No Rubor: No Dry / Scaly: No Maceration: No Temperature / Pain Temperature: No Abnormality Electronic Signature(s) Signed: 09/13/2018 2:47:29 PM By: Aaron Sitesorthy, Joanna Entered By: Aaron Sitesorthy, Joanna on 09/13/2018 11:39:15 [name], [name] ([id number]) -------------------------------------------------------------------------------- Vitals Details Patient Name: Dimitrov, [name] Date of Service: 09/13/2018 11:00 AM Medical Record Number: [id number] Patient Account Number: [id number] Date of Birth/Sex: [birth year] (83 y.o. M) Treating [name]: Aaron Sitesorthy, Joanna Primary Care Carolie Mcilrath: Aaron ChinMORAYATI, SHAMIL Other Clinician: Referring Lala Been: Aaron ChinMORAYATI, SHAMIL Treating Prentis Langdon/Extender: Aaron [name], MICHAEL G Weeks in Treatment: 18 Vital Signs Time Taken: 11:31 Temperature (F): 97.7 Height (in): 71 Pulse (bpm): 54 Weight (lbs): 238 Respiratory Rate (breaths/min): 16 Body Mass Index (BMI): 33.2 Blood Pressure (mmHg): 146/61 Reference Range: 80 - 120 mg / dl Electronic Signature(s) Signed: 09/13/2018 2:47:29 PM By: Aaron Sitesorthy, Joanna Entered By: Aaron Sitesorthy, Joanna on 09/13/2018 11:32:00
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
## 2018-09-14 ENCOUNTER — Ambulatory Visit: Payer: Self-pay | Admitting: Physician Assistant

## 2018-10-31 ENCOUNTER — Other Ambulatory Visit: Payer: Self-pay

## 2018-10-31 ENCOUNTER — Ambulatory Visit (INDEPENDENT_AMBULATORY_CARE_PROVIDER_SITE_OTHER): Payer: Medicare Other | Admitting: Internal Medicine

## 2018-10-31 ENCOUNTER — Encounter: Payer: Self-pay | Admitting: Internal Medicine

## 2018-10-31 VITALS — BP 134/82 | HR 67 | Temp 98.1°F | Resp 18 | Ht 70.5 in | Wt 247.0 lb

## 2018-10-31 DIAGNOSIS — I509 Heart failure, unspecified: Secondary | ICD-10-CM | POA: Diagnosis not present

## 2018-10-31 DIAGNOSIS — G4733 Obstructive sleep apnea (adult) (pediatric): Secondary | ICD-10-CM

## 2018-10-31 DIAGNOSIS — J440 Chronic obstructive pulmonary disease with acute lower respiratory infection: Secondary | ICD-10-CM | POA: Diagnosis not present

## 2018-10-31 DIAGNOSIS — Z9981 Dependence on supplemental oxygen: Secondary | ICD-10-CM

## 2018-10-31 DIAGNOSIS — R0602 Shortness of breath: Secondary | ICD-10-CM

## 2018-10-31 DIAGNOSIS — Z9989 Dependence on other enabling machines and devices: Secondary | ICD-10-CM

## 2018-10-31 DIAGNOSIS — J209 Acute bronchitis, unspecified: Secondary | ICD-10-CM

## 2018-10-31 DIAGNOSIS — K219 Gastro-esophageal reflux disease without esophagitis: Secondary | ICD-10-CM

## 2018-10-31 MED ORDER — PREDNISONE 5 MG PO TABS: ORAL_TABLET | ORAL | 0 refills | Status: DC

## 2018-10-31 MED ORDER — AZITHROMYCIN 250 MG PO TABS: ORAL_TABLET | ORAL | 0 refills | Status: DC

## 2018-10-31 MED ORDER — PREDNISONE 5 MG PO TABS: 5.0000 mg | ORAL_TABLET | Freq: Every day | ORAL | 0 refills | Status: DC

## 2018-10-31 NOTE — Progress Notes (Signed)
[facility] [address] [place], [place] [address]  Pulmonary Sleep Medicine   Office Visit Note  Patient Name: [name] DOB: 03-27-1936 MRN [id number]  Date of Service: 10/31/2018  Complaints/HPI: Patient states tha the has been ill for about 3 weeks. Went to his PCP and was told he had bronchitis. He was given amoxicillin and did not get better. He has been having cough and wheeze. Patient states that he has noted more SOB since he gets out of bed. Patient states that he has had no fever or chills. He is on oxygen and has been using it only when he feels he needs it. Patient states that he has been more SOB than usual  ROS  General: (-) fever, (-) chills, (-) night sweats, (-) weakness Skin: (-) rashes, (-) itching,. Eyes: (-) visual changes, (-) redness, (-) itching. Nose and Sinuses: (-) nasal stuffiness or itchiness, (-) postnasal drip, (-) nosebleeds, (-) sinus trouble. Mouth and Throat: (-) sore throat, (-) hoarseness. Neck: (-) swollen glands, (-) enlarged thyroid, (-) neck pain. Respiratory: - cough, (-) bloody sputum, + shortness of breath, - wheezing. Cardiovascular: - ankle swelling, (-) chest pain. Lymphatic: (-) lymph node enlargement. Neurologic: (-) numbness, (-) tingling. Psychiatric: (-) anxiety, (-) depression   Current Medication: Outpatient Encounter Medications as of 10/31/2018  Medication Sig  . albuterol (PROAIR HFA) 108 (90 Base) MCG/ACT inhaler Inhale 2 puffs into the lungs every 6 (six) hours as needed for wheezing or shortness of breath.  Marland Kitchen albuterol (PROVENTIL) (2.5 MG/3ML) 0.083% nebulizer solution Take 3 mLs (2.5 mg total) by nebulization every 6 (six) hours as needed for wheezing or shortness of breath.  Marland Kitchen aspirin EC 81 MG tablet Take 81 mg by mouth daily.  . Bromfenac Sodium (BROMSITE) 0.075 % SOLN Apply 1 drop to eye 2 (two) times daily.  . carvedilol (COREG) 25 MG tablet Take 25 mg by mouth 2 (two) times daily with a meal.  .  cholecalciferol (VITAMIN D) 1000 units tablet Take 1,000 Units by mouth at bedtime.   . Difluprednate (DUREZOL) 0.05 % EMUL Apply 1 drop to eye 2 (two) times daily.  . ergocalciferol (VITAMIN D2) 50000 units capsule Take 50,000 Units by mouth once a week.  . Exenatide ER (BYDUREON) 2 MG PEN Inject into the skin once a week.  . fexofenadine (ALLEGRA) 180 MG tablet Take 180 mg by mouth daily.  . furosemide (LASIX) 20 MG tablet Take 20 mg by mouth 2 (two) times daily.  Marland Kitchen gabapentin (NEURONTIN) 600 MG tablet Take 600 mg by mouth 2 (two) times daily.  Marland Kitchen glipiZIDE (GLUCOTROL) 5 MG tablet Take 5 mg by mouth daily before breakfast.  . levothyroxine (SYNTHROID, LEVOTHROID) 137 MCG tablet Take 137 mcg by mouth daily before breakfast.  . magnesium oxide (MAG-OX) 400 MG tablet Take 400 mg by mouth 2 (two) times daily.  . montelukast (SINGULAIR) 10 MG tablet Take 10 mg by mouth at bedtime.  . nepafenac (ILEVRO) 0.3 % ophthalmic suspension 1 drop at bedtime.  . OXYGEN Inhale into the lungs. 2 litre at night  . pravastatin (PRAVACHOL) 20 MG tablet Take 20 mg by mouth at bedtime.  . quinapril (ACCUPRIL) 40 MG tablet Take 20 mg by mouth at bedtime.  . ranitidine (ZANTAC) 150 MG tablet Take 150 mg by mouth at bedtime.  . Travoprost, BAK Free, (TRAVATAN) 0.004 % SOLN ophthalmic solution 1 drop at bedtime.  . TRELEGY ELLIPTA 100-62.5-25 MCG/INH AEPB Inhale 1 puff into the lungs  daily.  . [DISCONTINUED] canagliflozin (INVOKANA) 300 MG TABS tablet Take 300 mg by mouth daily before breakfast.  . [DISCONTINUED] Fluticasone-Salmeterol (ADVAIR) 250-50 MCG/DOSE AEPB Inhale 1 puff into the lungs 2 (two) times daily.  . [DISCONTINUED] levofloxacin (LEVAQUIN) 500 MG tablet Take 1 tablet (500 mg total) by mouth daily. (Patient not taking: Reported on 10/31/2018)  . [DISCONTINUED] predniSONE (STERAPRED UNI-PAK 21 TAB) 10 MG (21) TBPK tablet As directed (Patient not taking: Reported on 10/31/2018)  . [DISCONTINUED] tiotropium  (SPIRIVA) 18 MCG inhalation capsule Place 18 mcg into inhaler and inhale daily.   No facility-administered encounter medications on file as of 10/31/2018.     Surgical History: Past Surgical History:  Procedure Laterality Date  . CARDIAC CATHETERIZATION    . CATARACT EXTRACTION W/PHACO Right 09/01/2015   Procedure: CATARACT EXTRACTION PHACO AND INTRAOCULAR LENS PLACEMENT (IOC);  Surgeon: Sallee LangeSteven Dingeldein, MD;  Location: [facility] ORS;  Service: Ophthalmology;  Laterality: Right;  US 01:33 AP% 24.9 CDE 42.99 fluid pack lot # [id number]  . CATARACT EXTRACTION W/PHACO Left 09/22/2015   Procedure: CATARACT EXTRACTION PHACO AND INTRAOCULAR LENS PLACEMENT (IOC);  Surgeon: Sallee LangeSteven Dingeldein, MD;  Location: [facility] ORS;  Service: Ophthalmology;  Laterality: Left;  US 01:28 AP% 20.9 CDE 29.54 fluid pack lot # [id number]  . CHOLECYSTECTOMY    . EYE SURGERY    . Orbital Decompression    . TONSILLECTOMY      Medical History: Past Medical History:  Diagnosis Date  . Arthritis   . Asthma   . CHF (congestive heart failure) (HCC)   . COPD (chronic obstructive pulmonary disease) (HCC)   . Diabetes mellitus without complication (HCC)   . GERD (gastroesophageal reflux disease)   . H/O Graves' disease   . H/O wheezing   . HOH (hard of hearing)   . Hypercholesterolemia   . Hypertension   . Hypothyroidism   . Lower extremity edema   . Multiple allergies   . Palpitations   . Peripheral vascular disease (HCC)    swelling of feet and legs  . Pneumonia    in the past  . PONV (postoperative nausea and vomiting)   . Shortness of breath dyspnea   . Sleep apnea    CPAP    Family History: Family History  Problem Relation Age of Onset  . Diabetes Father     Social History: Social History   Socioeconomic History  . Marital status: Married    Spouse name: Not on file  . Number of children: Not on file  . Years of education: Not on file  . Highest education level: Not on file  Occupational History   . Not on file  Social Needs  . Financial resource strain: Not on file  . Food insecurity:    Worry: Not on file    Inability: Not on file  . Transportation needs:    Medical: Not on file    Non-medical: Not on file  Tobacco Use  . Smoking status: Former Games developermoker  . Smokeless tobacco: Never Used  Substance and Sexual Activity  . Alcohol use: Yes    Comment: couple glasses of wine daily  . Drug use: No  . Sexual activity: Not on file  Lifestyle  . Physical activity:    Days per week: Not on file    Minutes per session: Not on file  . Stress: Not on file  Relationships  . Social connections:    Talks on phone: Not on file    Gets together:  Not on file    Attends religious service: Not on file    Active member of club or organization: Not on file    Attends meetings of clubs or organizations: Not on file    Relationship status: Not on file  . Intimate partner violence:    Fear of current or ex partner: Not on file    Emotionally abused: Not on file    Physically abused: Not on file    Forced sexual activity: Not on file  Other Topics Concern  . Not on file  Social History Narrative  . Not on file    Vital Signs: Blood pressure 134/82, pulse 67, temperature 98.1 F (36.7 C), resp. rate 18, height 5' 10.5" (1.791 m), weight 247 lb (112 kg), SpO2 95 %.  Examination: General Appearance: The patient is well-developed, well-nourished, and in no distress. Skin: Gross inspection of skin unremarkable. Head: normocephalic, no gross deformities. Eyes: no gross deformities noted. ENT: ears appear grossly normal no exudates. Neck: Supple. No thyromegaly. No LAD. Respiratory: noted rhonchi. Cardiovascular: Normal S1 and S2 without murmur or rub. Extremities: No cyanosis. pulses are equal. Neurologic: Alert and oriented. No involuntary movements.  LABS: No results found for this or any previous visit (from the past 2160 hour(s)).  Radiology: Dg Chest 2 View  Result Date:  02/08/2018 CLINICAL DATA:  Acute bronchitis with COPD EXAM: CHEST - 2 VIEW COMPARISON:  06/26/2016 FINDINGS: COPD with pulmonary hyperinflation and prominent bibasilar lung markings. No acute infiltrate or effusion. Negative for heart failure. IMPRESSION: COPD with hyperinflation and prominent markings. No acute abnormality. Electronically Signed   By: Marlan Palau M.D.   On: 02/08/2018 14:20    No results found.  No results found.    Assessment and Plan: Patient Active Problem List   Diagnosis Date Noted  . HYPONATREMIA, CHRONIC 03/12/2007  . PERIPHERAL NEUROPATHY 03/12/2007  . 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 HX, PERSONAL, TOBACCO USE 03/12/2007  . HYPOTHYROIDISM 03/09/2007  . DIABETES MELLITUS, TYPE II 03/09/2007  . HYPERLIPIDEMIA 03/09/2007  . HYPERTENSION 03/09/2007  . BENIGN PROSTATIC HYPERTROPHY 03/09/2007    1. COPD with exacerbation will place on steroids and also zpk. Follow up CXR 2. OSA on CPAP will continue to monitor 3. CHF right now controlled 4. GERD stable 5. Oxygen dependent needs to be more compliant  General Counseling: I have discussed the findings of the evaluation and examination with Lamichael.  I have also discussed any further diagnostic evaluation thatmay be needed or ordered today. Bartholomew verbalizes understanding of the findings of todays visit. We also reviewed his medications today and discussed drug interactions and side effects including but not limited excessive drowsiness and altered mental states. We also discussed that there is always a risk not just to him but also people around him. he has been encouraged to call the office with any questions or concerns that should arise related to todays visit.    Time spent:  I have personally obtained a history, examined the patient, evaluated laboratory and imaging results, formulated the assessment and plan and placed orders.    Yevonne Pax, MD [facility] Pulmonary and Critical Care Sleep medicine

## 2018-10-31 NOTE — Patient Instructions (Signed)
Chronic Obstructive Pulmonary Disease Chronic obstructive pulmonary disease (COPD) is a long-term (chronic) lung problem. When you have COPD, it is hard for air to get in and out of your lungs. Usually the condition gets worse over time, and your lungs will never return to normal. There are things you can do to keep yourself as healthy as possible.  Your doctor may treat your condition with: ? Medicines. ? Oxygen. ? Lung surgery.  Your doctor may also recommend: ? Rehabilitation. This includes steps to make your body work better. It may involve a team of specialists. ? Quitting smoking, if you smoke. ? Exercise and changes to your diet. ? Comfort measures (palliative care). Follow these instructions at home: Medicines  Take over-the-counter and prescription medicines only as told by your doctor.  Talk to your doctor before taking any cough or allergy medicines. You may need to avoid medicines that cause your lungs to be dry. Lifestyle  If you smoke, stop. Smoking makes the problem worse. If you need help quitting, ask your doctor.  Avoid being around things that make your breathing worse. This may include smoke, chemicals, and fumes.  Stay active, but remember to rest as well.  Learn and use tips on how to relax.  Make sure you get enough sleep. Most adults need at least 7 hours of sleep every night.  Eat healthy foods. Eat smaller meals more often. Rest before meals. Controlled breathing Learn and use tips on how to control your breathing as told by your doctor. Try:  Breathing in (inhaling) through your nose for 1 second. Then, pucker your lips and breath out (exhale) through your lips for 2 seconds.  Putting one hand on your belly (abdomen). Breathe in slowly through your nose for 1 second. Your hand on your belly should move out. Pucker your lips and breathe out slowly through your lips. Your hand on your belly should move in as you breathe out.  Controlled coughing Learn  and use controlled coughing to clear mucus from your lungs. Follow these steps: 1. Lean your head a little forward. 2. Breathe in deeply. 3. Try to hold your breath for 3 seconds. 4. Keep your mouth slightly open while coughing 2 times. 5. Spit any mucus out into a tissue. 6. Rest and do the steps again 1 or 2 times as needed. General instructions  Make sure you get all the shots (vaccines) that your doctor recommends. Ask your doctor about a flu shot and a pneumonia shot.  Use oxygen therapy and pulmonary rehabilitation if told by your doctor. If you need home oxygen therapy, ask your doctor if you should buy a tool to measure your oxygen level (oximeter).  Make a COPD action plan with your doctor. This helps you to know what to do if you feel worse than usual.  Manage any other conditions you have as told by your doctor.  Avoid going outside when it is very hot, cold, or humid.  Avoid people who have a sickness you can catch (contagious).  Keep all follow-up visits as told by your doctor. This is important. Contact a doctor if:  You cough up more mucus than usual.  There is a change in the color or thickness of the mucus.  It is harder to breathe than usual.  Your breathing is faster than usual.  You have trouble sleeping.  You need to use your medicines more often than usual.  You have trouble doing your normal activities such as getting dressed   or walking around the house. Get help right away if:  You have shortness of breath while resting.  You have shortness of breath that stops you from: ? Being able to talk. ? Doing normal activities.  Your chest hurts for longer than 5 minutes.  Your skin color is more blue than usual.  Your pulse oximeter shows that you have low oxygen for longer than 5 minutes.  You have a fever.  You feel too tired to breathe normally. Summary  Chronic obstructive pulmonary disease (COPD) is a long-term lung problem.  The way your  lungs work will never return to normal. Usually the condition gets worse over time. There are things you can do to keep yourself as healthy as possible.  Take over-the-counter and prescription medicines only as told by your doctor.  If you smoke, stop. Smoking makes the problem worse. This information is not intended to replace advice given to you by your health care provider. Make sure you discuss any questions you have with your health care provider. Document Released: 11/10/2007 Document Revised: 06/28/2016 Document Reviewed: 06/28/2016 Elsevier Interactive Patient Education  2019 Elsevier Inc.  

## 2018-11-13 ENCOUNTER — Other Ambulatory Visit: Payer: Self-pay

## 2018-11-13 DIAGNOSIS — J449 Chronic obstructive pulmonary disease, unspecified: Secondary | ICD-10-CM

## 2018-11-13 MED ORDER — TRELEGY ELLIPTA 100-62.5-25 MCG/INH IN AEPB: 1.0000 | INHALATION_SPRAY | Freq: Every day | RESPIRATORY_TRACT | 2 refills | Status: DC

## 2018-12-14 ENCOUNTER — Telehealth: Payer: Self-pay

## 2018-12-14 NOTE — Telephone Encounter (Signed)
CMN SIGNED AND PLACED IN [employer] PATIENT BOX °

## 2019-01-29 ENCOUNTER — Other Ambulatory Visit: Payer: Self-pay

## 2019-01-29 ENCOUNTER — Encounter: Payer: Self-pay | Admitting: Internal Medicine

## 2019-01-29 ENCOUNTER — Ambulatory Visit (INDEPENDENT_AMBULATORY_CARE_PROVIDER_SITE_OTHER): Payer: Medicare Other | Admitting: Internal Medicine

## 2019-01-29 VITALS — BP 130/80 | HR 78 | Resp 16 | Ht 70.0 in | Wt 250.0 lb

## 2019-01-29 DIAGNOSIS — G4733 Obstructive sleep apnea (adult) (pediatric): Secondary | ICD-10-CM

## 2019-01-29 DIAGNOSIS — Z9981 Dependence on supplemental oxygen: Secondary | ICD-10-CM

## 2019-01-29 DIAGNOSIS — Z9989 Dependence on other enabling machines and devices: Secondary | ICD-10-CM

## 2019-01-29 DIAGNOSIS — R0602 Shortness of breath: Secondary | ICD-10-CM | POA: Diagnosis not present

## 2019-01-29 DIAGNOSIS — I509 Heart failure, unspecified: Secondary | ICD-10-CM | POA: Diagnosis not present

## 2019-01-29 DIAGNOSIS — K219 Gastro-esophageal reflux disease without esophagitis: Secondary | ICD-10-CM

## 2019-01-29 NOTE — Progress Notes (Signed)
[facility] [address] [place], [place] [address]  Pulmonary Sleep Medicine   Office Visit Note  Patient Name: Aaron JensenJohn L [name] DOB: 1935-10-23 MRN [id number]  Date of Service: 01/29/2019  Complaints/HPI: Pt is here for pulmonary follow up on OSA.  He reports he is wearing his CPAP without difficulty.  He is cleaning the machine and changing his filter and tubing as discussed. He denies any recent hospitalizations.  He has been wearing his oxygen at night as directed with his cpap.  He denies any current issues.    ROS  General: (-) fever, (-) chills, (-) night sweats, (-) weakness Skin: (-) rashes, (-) itching,. Eyes: (-) visual changes, (-) redness, (-) itching. Nose and Sinuses: (-) nasal stuffiness or itchiness, (-) postnasal drip, (-) nosebleeds, (-) sinus trouble. Mouth and Throat: (-) sore throat, (-) hoarseness. Neck: (-) swollen glands, (-) enlarged thyroid, (-) neck pain. Respiratory: - cough, (-) bloody sputum, - shortness of breath, - wheezing. Cardiovascular: - ankle swelling, (-) chest pain. Lymphatic: (-) lymph node enlargement. Neurologic: (-) numbness, (-) tingling. Psychiatric: (-) anxiety, (-) depression   Current Medication: Outpatient Encounter Medications as of 01/29/2019  Medication Sig  . albuterol (PROAIR HFA) 108 (90 Base) MCG/ACT inhaler Inhale 2 puffs into the lungs every 6 (six) hours as needed for wheezing or shortness of breath.  Marland Kitchen. albuterol (PROVENTIL) (2.5 MG/3ML) 0.083% nebulizer solution Take 3 mLs (2.5 mg total) by nebulization every 6 (six) hours as needed for wheezing or shortness of breath.  Marland Kitchen. aspirin EC 81 MG tablet Take 81 mg by mouth daily.  Marland Kitchen. azithromycin (ZITHROMAX) 250 MG tablet As directed  . Bromfenac Sodium (BROMSITE) 0.075 % SOLN Apply 1 drop to eye 2 (two) times daily.  . carvedilol (COREG) 25 MG tablet Take 25 mg by mouth 2 (two) times daily with a meal.  . cholecalciferol (VITAMIN D) 1000 units tablet Take 1,000  Units by mouth at bedtime.   . Difluprednate (DUREZOL) 0.05 % EMUL Apply 1 drop to eye 2 (two) times daily.  . ergocalciferol (VITAMIN D2) 50000 units capsule Take 50,000 Units by mouth once a week.  . Exenatide ER (BYDUREON) 2 MG PEN Inject into the skin once a week.  . fexofenadine (ALLEGRA) 180 MG tablet Take 180 mg by mouth daily.  . furosemide (LASIX) 20 MG tablet Take 20 mg by mouth 2 (two) times daily.  Marland Kitchen. gabapentin (NEURONTIN) 600 MG tablet Take 600 mg by mouth 2 (two) times daily.  Marland Kitchen. glipiZIDE (GLUCOTROL) 5 MG tablet Take 5 mg by mouth daily before breakfast.  . levothyroxine (SYNTHROID, LEVOTHROID) 137 MCG tablet Take 137 mcg by mouth daily before breakfast.  . magnesium oxide (MAG-OX) 400 MG tablet Take 400 mg by mouth 2 (two) times daily.  . montelukast (SINGULAIR) 10 MG tablet Take 10 mg by mouth at bedtime.  . nepafenac (ILEVRO) 0.3 % ophthalmic suspension 1 drop at bedtime.  . OXYGEN Inhale into the lungs. 2 litre at night  . pravastatin (PRAVACHOL) 20 MG tablet Take 20 mg by mouth at bedtime.  . predniSONE (DELTASONE) 5 MG tablet Use as directed for 6 days  . quinapril (ACCUPRIL) 40 MG tablet Take 20 mg by mouth at bedtime.  . ranitidine (ZANTAC) 150 MG tablet Take 150 mg by mouth at bedtime.  . Travoprost, BAK Free, (TRAVATAN) 0.004 % SOLN ophthalmic solution 1 drop at bedtime.  . TRELEGY ELLIPTA 100-62.5-25 MCG/INH AEPB Inhale 1 puff into the lungs daily.   No facility-administered  encounter medications on file as of 01/29/2019.     Surgical History: Past Surgical History:  Procedure Laterality Date  . CARDIAC CATHETERIZATION    . CATARACT EXTRACTION W/PHACO Right 09/01/2015   Procedure: CATARACT EXTRACTION PHACO AND INTRAOCULAR LENS PLACEMENT (IOC);  Surgeon: Sallee LangeSteven Dingeldein, MD;  Location: [facility] ORS;  Service: Ophthalmology;  Laterality: Right;  US 01:33 AP% 24.9 CDE 42.99 fluid pack lot # [id number]  . CATARACT EXTRACTION W/PHACO Left 09/22/2015   Procedure: CATARACT  EXTRACTION PHACO AND INTRAOCULAR LENS PLACEMENT (IOC);  Surgeon: Sallee LangeSteven Dingeldein, MD;  Location: [facility] ORS;  Service: Ophthalmology;  Laterality: Left;  US 01:28 AP% 20.9 CDE 29.54 fluid pack lot # [id number]  . CHOLECYSTECTOMY    . EYE SURGERY    . Orbital Decompression    . TONSILLECTOMY      Medical History: Past Medical History:  Diagnosis Date  . Arthritis   . Asthma   . CHF (congestive heart failure) (HCC)   . COPD (chronic obstructive pulmonary disease) (HCC)   . Diabetes mellitus without complication (HCC)   . GERD (gastroesophageal reflux disease)   . H/O Graves' disease   . H/O wheezing   . HOH (hard of hearing)   . Hypercholesterolemia   . Hypertension   . Hypothyroidism   . Lower extremity edema   . Multiple allergies   . Palpitations   . Peripheral vascular disease (HCC)    swelling of feet and legs  . Pneumonia    in the past  . PONV (postoperative nausea and vomiting)   . Shortness of breath dyspnea   . Sleep apnea    CPAP    Family History: Family History  Problem Relation Age of Onset  . Diabetes Father     Social History: Social History   Socioeconomic History  . Marital status: Married    Spouse name: Not on file  . Number of children: Not on file  . Years of education: Not on file  . Highest education level: Not on file  Occupational History  . Not on file  Social Needs  . Financial resource strain: Not on file  . Food insecurity    Worry: Not on file    Inability: Not on file  . Transportation needs    Medical: Not on file    Non-medical: Not on file  Tobacco Use  . Smoking status: Former Games developermoker  . Smokeless tobacco: Never Used  Substance and Sexual Activity  . Alcohol use: Yes    Comment: couple glasses of wine daily  . Drug use: No  . Sexual activity: Not on file  Lifestyle  . Physical activity    Days per week: Not on file    Minutes per session: Not on file  . Stress: Not on file  Relationships  . Social Wellsite geologistconnections     Talks on phone: Not on file    Gets together: Not on file    Attends religious service: Not on file    Active member of club or organization: Not on file    Attends meetings of clubs or organizations: Not on file    Relationship status: Not on file  . Intimate partner violence    Fear of current or ex partner: Not on file    Emotionally abused: Not on file    Physically abused: Not on file    Forced sexual activity: Not on file  Other Topics Concern  . Not on file  Social History Narrative  .  Not on file    Vital Signs: Blood pressure 130/80, pulse 78, resp. rate 16, height 5\' 10"  (1.778 m), weight 250 lb (113.4 kg), SpO2 93 %.  Examination: General Appearance: The patient is well-developed, well-nourished, and in no distress. Skin: Gross inspection of skin unremarkable. Head: normocephalic, no gross deformities. Eyes: no gross deformities noted. ENT: ears appear grossly normal no exudates. Neck: Supple. No thyromegaly. No LAD. Respiratory: Diminished bilaerally. Cardiovascular: Normal S1 and S2 without murmur or rub. Extremities: No cyanosis. pulses are equal. Neurologic: Alert and oriented. No involuntary movements.  LABS: No results found for this or any previous visit (from the past 2160 hour(s)).  Radiology: Dg Chest 2 View  Result Date: 02/08/2018 CLINICAL DATA:  Acute bronchitis with COPD EXAM: CHEST - 2 VIEW COMPARISON:  06/26/2016 FINDINGS: COPD with pulmonary hyperinflation and prominent bibasilar lung markings. No acute infiltrate or effusion. Negative for heart failure. IMPRESSION: COPD with hyperinflation and prominent markings. No acute abnormality. Electronically Signed   By: Franchot Gallo M.D.   On: 02/08/2018 14:20    No results found.  No results found.    Assessment and Plan: Patient Active Problem List   Diagnosis Date Noted  . HYPONATREMIA, CHRONIC 03/12/2007  . PERIPHERAL NEUROPATHY 03/12/2007  . 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 HX,  PERSONAL, TOBACCO USE 03/12/2007  . HYPOTHYROIDISM 03/09/2007  . DIABETES MELLITUS, TYPE II 03/09/2007  . HYPERLIPIDEMIA 03/09/2007  . HYPERTENSION 03/09/2007  . BENIGN PROSTATIC HYPERTROPHY 03/09/2007    1. OSA on CPAP Continue to use cpap as discussed. OSA well controlled at this time.   2. Congestive heart failure, unspecified HF chronicity, unspecified heart failure type ([place]) Stable, continue present management.   3. Oxygen dependent Stable, continue to use oxygen as prescribed.   4. Gastroesophageal reflux disease, esophagitis presence not specified Continue to take GERD medicine as discussed.   5. SOB (shortness of breath) - Spirometry with Graph  General Counseling: I have discussed the findings of the evaluation and examination with Faraaz.  I have also discussed any further diagnostic evaluation thatmay be needed or ordered today. Lazlo verbalizes understanding of the findings of todays visit. We also reviewed his medications today and discussed drug interactions and side effects including but not limited excessive drowsiness and altered mental states. We also discussed that there is always a risk not just to him but also people around him. he has been encouraged to call the office with any questions or concerns that should arise related to todays visit.    Time spent: 20 This patient was seen by Orson Gear AGNP-C in Collaboration with Dr. Devona Konig as a part of collaborative care agreement.   I have personally obtained a history, examined the patient, evaluated laboratory and imaging results, formulated the assessment and plan and placed orders.    Allyne Gee, MD [facility] Pulmonary and Critical Care Sleep medicine

## 2019-03-07 ENCOUNTER — Other Ambulatory Visit: Payer: Self-pay

## 2019-03-07 ENCOUNTER — Ambulatory Visit (INDEPENDENT_AMBULATORY_CARE_PROVIDER_SITE_OTHER): Payer: Medicare Other

## 2019-03-07 DIAGNOSIS — G4733 Obstructive sleep apnea (adult) (pediatric): Secondary | ICD-10-CM

## 2019-03-07 NOTE — Progress Notes (Signed)
95 percentile pressure 12   95th percentile leak 12.4   apnea index 0.1 /hr  apnea-hypopnea index  1.1/hr   total days used  >4 hr 60 days  total days used <4 hr 0 days  Total compliance 100 percent  He is doing great no questions or problems

## 2019-06-06 ENCOUNTER — Telehealth: Payer: Self-pay

## 2019-06-06 NOTE — Telephone Encounter (Signed)
Called lmom informing patient of appointment. klh 

## 2019-06-06 NOTE — Telephone Encounter (Signed)
Confirmed appointment with patient. klh °

## 2019-06-11 ENCOUNTER — Ambulatory Visit (INDEPENDENT_AMBULATORY_CARE_PROVIDER_SITE_OTHER): Payer: Medicare Other | Admitting: Internal Medicine

## 2019-06-11 ENCOUNTER — Other Ambulatory Visit: Payer: Self-pay

## 2019-06-11 ENCOUNTER — Encounter: Payer: Self-pay | Admitting: Internal Medicine

## 2019-06-11 VITALS — BP 141/75 | HR 59 | Temp 97.3°F | Ht 70.2 in | Wt 261.0 lb

## 2019-06-11 DIAGNOSIS — G4733 Obstructive sleep apnea (adult) (pediatric): Secondary | ICD-10-CM

## 2019-06-11 DIAGNOSIS — R062 Wheezing: Secondary | ICD-10-CM

## 2019-06-11 DIAGNOSIS — J449 Chronic obstructive pulmonary disease, unspecified: Secondary | ICD-10-CM

## 2019-06-11 DIAGNOSIS — J9610 Chronic respiratory failure, unspecified whether with hypoxia or hypercapnia: Secondary | ICD-10-CM | POA: Diagnosis not present

## 2019-06-11 DIAGNOSIS — Z9989 Dependence on other enabling machines and devices: Secondary | ICD-10-CM

## 2019-06-11 DIAGNOSIS — R0602 Shortness of breath: Secondary | ICD-10-CM

## 2019-06-11 DIAGNOSIS — Z9981 Dependence on supplemental oxygen: Secondary | ICD-10-CM | POA: Diagnosis not present

## 2019-06-11 MED ORDER — ALBUTEROL SULFATE HFA 108 (90 BASE) MCG/ACT IN AERS: 2.0000 | INHALATION_SPRAY | Freq: Four times a day (QID) | RESPIRATORY_TRACT | 4 refills | Status: DC | PRN

## 2019-06-11 NOTE — Progress Notes (Signed)
[facility] [address] [place], [place] [address]  Pulmonary Sleep Medicine   Office Visit Note  Patient Name: [name] DOB: 1935/08/17 MRN [id number]  Date of Service: 06/11/2019  Complaints/HPI: Pt is here for follow up on OSA, copd, with chronic respiratory failure with oxygen dependence. He currently uses 2 LPM continually.  Pt reports good compliance with CPAP therapy. Cleaning machine by hand, and changing filters and tubing as directed. Denies headaches, sinus issues, palpitations, or hemoptysis. He bleeds oxygen into his cpap at night.  He is sob today and reports "its not a good day"  He has some wheezing noted one exam.  He is usesing  trelegy inhaler daily.  He does not currently have a rescue inhaler.  He does have a nebulizer, but has not used it lately. Spirometry was not done today, due to wheezing, and sob.   ROS  General: (-) fever, (-) chills, (-) night sweats, (-) weakness Skin: (-) rashes, (-) itching,. Eyes: (-) visual changes, (-) redness, (-) itching. Nose and Sinuses: (-) nasal stuffiness or itchiness, (-) postnasal drip, (-) nosebleeds, (-) sinus trouble. Mouth and Throat: (-) sore throat, (-) hoarseness. Neck: (-) swollen glands, (-) enlarged thyroid, (-) neck pain. Respiratory: - cough, (-) bloody sputum, + shortness of breath, + wheezing. Cardiovascular: - ankle swelling, (-) chest pain. Lymphatic: (-) lymph node enlargement. Neurologic: (-) numbness, (-) tingling. Psychiatric: (-) anxiety, (-) depression   Current Medication: Outpatient Encounter Medications as of 06/11/2019  Medication Sig  . albuterol (PROAIR HFA) 108 (90 Base) MCG/ACT inhaler Inhale 2 puffs into the lungs every 6 (six) hours as needed for wheezing or shortness of breath.  Marland Kitchen albuterol (PROVENTIL) (2.5 MG/3ML) 0.083% nebulizer solution Take 3 mLs (2.5 mg total) by nebulization every 6 (six) hours as needed for wheezing or shortness of breath.  Marland Kitchen aspirin EC 81 MG  tablet Take 81 mg by mouth daily.  Marland Kitchen azithromycin (ZITHROMAX) 250 MG tablet As directed  . Bromfenac Sodium (BROMSITE) 0.075 % SOLN Apply 1 drop to eye 2 (two) times daily.  . carvedilol (COREG) 25 MG tablet Take 25 mg by mouth 2 (two) times daily with a meal.  . cholecalciferol (VITAMIN D) 1000 units tablet Take 1,000 Units by mouth at bedtime.   . Difluprednate (DUREZOL) 0.05 % EMUL Apply 1 drop to eye 2 (two) times daily.  . ergocalciferol (VITAMIN D2) 50000 units capsule Take 50,000 Units by mouth once a week.  . Exenatide ER (BYDUREON) 2 MG PEN Inject into the skin once a week.  . fexofenadine (ALLEGRA) 180 MG tablet Take 180 mg by mouth daily.  . furosemide (LASIX) 20 MG tablet Take 20 mg by mouth 2 (two) times daily.  Marland Kitchen gabapentin (NEURONTIN) 600 MG tablet Take 600 mg by mouth 2 (two) times daily.  Marland Kitchen glipiZIDE (GLUCOTROL) 5 MG tablet Take 5 mg by mouth daily before breakfast.  . levothyroxine (SYNTHROID, LEVOTHROID) 137 MCG tablet Take 137 mcg by mouth daily before breakfast.  . magnesium oxide (MAG-OX) 400 MG tablet Take 400 mg by mouth 2 (two) times daily.  . montelukast (SINGULAIR) 10 MG tablet Take 10 mg by mouth at bedtime.  . nepafenac (ILEVRO) 0.3 % ophthalmic suspension 1 drop at bedtime.  . OXYGEN Inhale into the lungs. 2 litre at night  . pravastatin (PRAVACHOL) 20 MG tablet Take 20 mg by mouth at bedtime.  . predniSONE (DELTASONE) 5 MG tablet Use as directed for 6 days  . quinapril (ACCUPRIL) 40  MG tablet Take 20 mg by mouth at bedtime.  . ranitidine (ZANTAC) 150 MG tablet Take 150 mg by mouth at bedtime.  . Travoprost, BAK Free, (TRAVATAN) 0.004 % SOLN ophthalmic solution 1 drop at bedtime.  . TRELEGY ELLIPTA 100-62.5-25 MCG/INH AEPB Inhale 1 puff into the lungs daily.   No facility-administered encounter medications on file as of 06/11/2019.    Surgical History: Past Surgical History:  Procedure Laterality Date  . CARDIAC CATHETERIZATION    . CATARACT EXTRACTION  W/PHACO Right 09/01/2015   Procedure: CATARACT EXTRACTION PHACO AND INTRAOCULAR LENS PLACEMENT (IOC);  Surgeon: Sallee Lange, MD;  Location: [facility] ORS;  Service: Ophthalmology;  Laterality: Right;  [place] 01:33 AP% 24.9 CDE 42.99 fluid pack lot # [id number]  . CATARACT EXTRACTION W/PHACO Left 09/22/2015   Procedure: CATARACT EXTRACTION PHACO AND INTRAOCULAR LENS PLACEMENT (IOC);  Surgeon: Sallee Lange, MD;  Location: [facility] ORS;  Service: Ophthalmology;  Laterality: Left;  [place] 01:28 AP% 20.9 CDE 29.54 fluid pack lot # [id number]  . CHOLECYSTECTOMY    . EYE SURGERY    . Orbital Decompression    . TONSILLECTOMY      Medical History: Past Medical History:  Diagnosis Date  . Arthritis   . Asthma   . CHF (congestive heart failure) (HCC)   . COPD (chronic obstructive pulmonary disease) (HCC)   . Diabetes mellitus without complication (HCC)   . GERD (gastroesophageal reflux disease)   . H/O Graves' disease   . H/O wheezing   . HOH (hard of hearing)   . Hypercholesterolemia   . Hypertension   . Hypothyroidism   . Lower extremity edema   . Multiple allergies   . Palpitations   . Peripheral vascular disease (HCC)    swelling of feet and legs  . Pneumonia    in the past  . PONV (postoperative nausea and vomiting)   . Shortness of breath dyspnea   . Sleep apnea    CPAP    Family History: Family History  Problem Relation Age of Onset  . Diabetes Father     Social History: Social History   Socioeconomic History  . Marital status: Married    Spouse name: Not on file  . Number of children: Not on file  . Years of education: Not on file  . Highest education level: Not on file  Occupational History  . Not on file  Tobacco Use  . Smoking status: Former Games developer  . Smokeless tobacco: Never Used  Substance and Sexual Activity  . Alcohol use: Yes    Comment: couple glasses of wine daily  . Drug use: No  . Sexual activity: Not on file  Other Topics Concern  . Not on file   Social History Narrative  . Not on file   Social Determinants of Health   Financial Resource Strain:   . Difficulty of Paying Living Expenses: Not on file  Food Insecurity:   . Worried About Programme researcher, broadcasting/film/video in the Last Year: Not on file  . Ran Out of Food in the Last Year: Not on file  Transportation Needs:   . Lack of Transportation (Medical): Not on file  . Lack of Transportation (Non-Medical): Not on file  Physical Activity:   . Days of Exercise per Week: Not on file  . Minutes of Exercise per Session: Not on file  Stress:   . Feeling of Stress : Not on file  Social Connections:   . Frequency of Communication with Friends and Family:  Not on file  . Frequency of Social Gatherings with Friends and Family: Not on file  . Attends Religious Services: Not on file  . Active Member of Clubs or Organizations: Not on file  . Attends Archivist Meetings: Not on file  . Marital Status: Not on file  Intimate Partner Violence:   . Fear of Current or Ex-Partner: Not on file  . Emotionally Abused: Not on file  . Physically Abused: Not on file  . Sexually Abused: Not on file    Vital Signs: Blood pressure (!) 141/75, pulse (!) 59, temperature (!) 97.3 F (36.3 C), height 5' 10.2" (1.783 m), weight 261 lb (118.4 kg), SpO2 95 %.  Examination: General Appearance: The patient is well-developed, well-nourished, and in no distress. Skin: Gross inspection of skin unremarkable. Head: normocephalic, no gross deformities. Eyes: no gross deformities noted. ENT: ears appear grossly normal no exudates. Neck: Supple. No thyromegaly. No LAD. Respiratory: wheezing bilaterally, expiratory. Cardiovascular: Normal S1 and S2 without murmur or rub. Extremities: No cyanosis. pulses are equal. Neurologic: Alert and oriented. No involuntary movements.  LABS: No results found for this or any previous visit (from the past 2160 hour(s)).  Radiology: DG Chest 2 View  Result Date:  02/08/2018 CLINICAL DATA:  Acute bronchitis with COPD EXAM: CHEST - 2 VIEW COMPARISON:  06/26/2016 FINDINGS: COPD with pulmonary hyperinflation and prominent bibasilar lung markings. No acute infiltrate or effusion. Negative for heart failure. IMPRESSION: COPD with hyperinflation and prominent markings. No acute abnormality. Electronically Signed   By: Franchot Gallo M.D.   On: 02/08/2018 14:20    No results found.  No results found.    Assessment and Plan: Patient Active Problem List   Diagnosis Date Noted  . HYPONATREMIA, CHRONIC 03/12/2007  . PERIPHERAL NEUROPATHY 03/12/2007  . 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 HX, PERSONAL, TOBACCO USE 03/12/2007  . HYPOTHYROIDISM 03/09/2007  . DIABETES MELLITUS, TYPE II 03/09/2007  . HYPERLIPIDEMIA 03/09/2007  . HYPERTENSION 03/09/2007  . BENIGN PROSTATIC HYPERTROPHY 03/09/2007   1. Chronic obstructive pulmonary disease, unspecified COPD type (HCC) Use albuterol MDI as directed for sob.  Encouraged patient to use nebulizer as prescribed. Follow up in office if no improvement with breathing treatment, or go to ER.   - albuterol (PROAIR HFA) 108 (90 Base) MCG/ACT inhaler; Inhale 2 puffs into the lungs every 6 (six) hours as needed for wheezing or shortness of breath.  Dispense: 18 g; Refill: 4  2. OSA on CPAP Continue to use cpap as directed, with oxygen.   3. Chronic respiratory failure, unspecified whether with hypoxia or hypercapnia (HCC) Continue to use inhalers/nebulizers and oxygen as directed.   4. Oxygen dependent Continue with 2 LPM at all times.   5. SOB (shortness of breath) No spiro today.   6. Wheezing Pt declined breathing treatment in office, he will do one when he gets home.    General Counseling: I have discussed the findings of the evaluation and examination with Masen.  I have also discussed any further diagnostic evaluation thatmay be needed or ordered today. Bradon verbalizes understanding of the findings of todays visit.  We also reviewed his medications today and discussed drug interactions and side effects including but not limited excessive drowsiness and altered mental states. We also discussed that there is always a risk not just to him but also people around him. he has been encouraged to call the office with any questions or concerns that should arise related to todays visit.  No orders of  the defined types were placed in this encounter.    Time spent: 25 This patient was seen by Blima Ledger AGNP-C in Collaboration with Dr. Freda Munro as a part of collaborative care agreement.   I have personally obtained a history, examined the patient, evaluated laboratory and imaging results, formulated the assessment and plan and placed orders.    Yevonne Pax, MD [facility] Pulmonary and Critical Care Sleep medicine

## 2019-07-07 ENCOUNTER — Ambulatory Visit: Payer: Medicare Other

## 2019-07-12 ENCOUNTER — Ambulatory Visit: Payer: Medicare Other

## 2019-07-24 ENCOUNTER — Other Ambulatory Visit: Payer: Self-pay

## 2019-07-24 DIAGNOSIS — J449 Chronic obstructive pulmonary disease, unspecified: Secondary | ICD-10-CM

## 2019-07-24 MED ORDER — TRELEGY ELLIPTA 100-62.5-25 MCG/INH IN AEPB: 1.0000 | INHALATION_SPRAY | Freq: Every day | RESPIRATORY_TRACT | 2 refills | Status: DC

## 2019-08-20 ENCOUNTER — Other Ambulatory Visit: Payer: Self-pay

## 2019-08-20 MED ORDER — ALBUTEROL SULFATE (2.5 MG/3ML) 0.083% IN NEBU: 2.5000 mg | INHALATION_SOLUTION | Freq: Four times a day (QID) | RESPIRATORY_TRACT | 1 refills | Status: DC | PRN

## 2019-09-05 ENCOUNTER — Other Ambulatory Visit: Payer: Self-pay

## 2019-09-05 ENCOUNTER — Ambulatory Visit (INDEPENDENT_AMBULATORY_CARE_PROVIDER_SITE_OTHER): Payer: Medicare Other

## 2019-09-05 DIAGNOSIS — G4733 Obstructive sleep apnea (adult) (pediatric): Secondary | ICD-10-CM

## 2019-09-05 NOTE — Progress Notes (Signed)
95 percentile pressure 12   95th percentile leak 18.8   apnea index 0.1 /hr  apnea-hypopnea index  1.1 /hr   total days used  >4 hr 90 days  total days used <4 hr 0 days  Total compliance 100 percent  He is doing great on cpap, no problems or questions at this time

## 2019-09-06 ENCOUNTER — Telehealth: Payer: Self-pay

## 2019-09-06 NOTE — Telephone Encounter (Signed)
Confirmed appointment on 09/11/2019 and screened for covid. klh 

## 2019-09-11 ENCOUNTER — Encounter: Payer: Self-pay | Admitting: Internal Medicine

## 2019-09-11 ENCOUNTER — Ambulatory Visit (INDEPENDENT_AMBULATORY_CARE_PROVIDER_SITE_OTHER): Payer: Medicare Other | Admitting: Internal Medicine

## 2019-09-11 ENCOUNTER — Other Ambulatory Visit: Payer: Self-pay

## 2019-09-11 VITALS — BP 164/71 | HR 71 | Temp 99.0°F | Resp 16 | Ht 70.0 in | Wt 251.4 lb

## 2019-09-11 DIAGNOSIS — J449 Chronic obstructive pulmonary disease, unspecified: Secondary | ICD-10-CM

## 2019-09-11 DIAGNOSIS — G4733 Obstructive sleep apnea (adult) (pediatric): Secondary | ICD-10-CM

## 2019-09-11 DIAGNOSIS — K219 Gastro-esophageal reflux disease without esophagitis: Secondary | ICD-10-CM

## 2019-09-11 DIAGNOSIS — R0602 Shortness of breath: Secondary | ICD-10-CM | POA: Diagnosis not present

## 2019-09-11 DIAGNOSIS — J9610 Chronic respiratory failure, unspecified whether with hypoxia or hypercapnia: Secondary | ICD-10-CM | POA: Diagnosis not present

## 2019-09-11 DIAGNOSIS — Z9989 Dependence on other enabling machines and devices: Secondary | ICD-10-CM

## 2019-09-11 NOTE — Progress Notes (Signed)
[facility] [address] [place], [place] [address]  Pulmonary Sleep Medicine   Office Visit Note  Patient Name: [name] DOB: 07/13/1935 MRN [id number]  Date of Service: 09/11/2019  Complaints/HPI: Complaints/HPI: Pt is here for follow up on OSA, copd, with chronic respiratory failure with oxygen dependence. He currently uses 2 LPM continually. He sats 80% on room air at rest.  He reports that he has recently been dealing with respiratory infection and has been on prednisone.  He is feelign better over the last few days.  He continues to use cpap nightly for osa. Pt reports good compliance with CPAP therapy. Cleaning machine by hand, and changing filters and tubing as directed. Denies headaches, sinus issues, palpitations, or hemoptysis.  He shows 100% compliance at this time with an overall ahi of 1.1/hr.   ROS  General: (-) fever, (-) chills, (-) night sweats, (-) weakness Skin: (-) rashes, (-) itching,. Eyes: (-) visual changes, (-) redness, (-) itching. Nose and Sinuses: (-) nasal stuffiness or itchiness, (-) postnasal drip, (-) nosebleeds, (-) sinus trouble. Mouth and Throat: (-) sore throat, (-) hoarseness. Neck: (-) swollen glands, (-) enlarged thyroid, (-) neck pain. Respiratory: - cough, (-) bloody sputum, - shortness of breath, - wheezing. Cardiovascular: - ankle swelling, (-) chest pain. Lymphatic: (-) lymph node enlargement. Neurologic: (-) numbness, (-) tingling. Psychiatric: (-) anxiety, (-) depression   Current Medication: Outpatient Encounter Medications as of 09/11/2019  Medication Sig  . albuterol (PROAIR HFA) 108 (90 Base) MCG/ACT inhaler Inhale 2 puffs into the lungs every 6 (six) hours as needed for wheezing or shortness of breath.  Marland Kitchen albuterol (PROVENTIL) (2.5 MG/3ML) 0.083% nebulizer solution Take 3 mLs (2.5 mg total) by nebulization every 6 (six) hours as needed for wheezing or shortness of breath.  Marland Kitchen aspirin EC 81 MG tablet Take 81 mg by  mouth daily.  Marland Kitchen azithromycin (ZITHROMAX) 250 MG tablet As directed  . Bromfenac Sodium (BROMSITE) 0.075 % SOLN Apply 1 drop to eye 2 (two) times daily.  . carvedilol (COREG) 25 MG tablet Take 25 mg by mouth 2 (two) times daily with a meal.  . cholecalciferol (VITAMIN D) 1000 units tablet Take 1,000 Units by mouth at bedtime.   . Difluprednate (DUREZOL) 0.05 % EMUL Apply 1 drop to eye 2 (two) times daily.  . ergocalciferol (VITAMIN D2) 50000 units capsule Take 50,000 Units by mouth once a week.  . Exenatide ER (BYDUREON) 2 MG PEN Inject into the skin once a week.  . fexofenadine (ALLEGRA) 180 MG tablet Take 180 mg by mouth daily.  . furosemide (LASIX) 20 MG tablet Take 20 mg by mouth 2 (two) times daily.  Marland Kitchen gabapentin (NEURONTIN) 600 MG tablet Take 600 mg by mouth 2 (two) times daily.  Marland Kitchen glipiZIDE (GLUCOTROL) 5 MG tablet Take 5 mg by mouth daily before breakfast.  . JARDIANCE 10 MG TABS tablet Take 10 mg by mouth daily.  Marland Kitchen levothyroxine (SYNTHROID, LEVOTHROID) 137 MCG tablet Take 137 mcg by mouth daily before breakfast.  . magnesium oxide (MAG-OX) 400 MG tablet Take 400 mg by mouth 2 (two) times daily.  . montelukast (SINGULAIR) 10 MG tablet Take 10 mg by mouth at bedtime.  . nepafenac (ILEVRO) 0.3 % ophthalmic suspension 1 drop at bedtime.  . OXYGEN Inhale into the lungs. 2 litre at night  . pravastatin (PRAVACHOL) 20 MG tablet Take 20 mg by mouth at bedtime.  . predniSONE (DELTASONE) 5 MG tablet Use as directed for 6 days  .  quinapril (ACCUPRIL) 40 MG tablet Take 20 mg by mouth at bedtime.  . ranitidine (ZANTAC) 150 MG tablet Take 150 mg by mouth at bedtime.  . Travoprost, BAK Free, (TRAVATAN) 0.004 % SOLN ophthalmic solution 1 drop at bedtime.  . TRELEGY ELLIPTA 100-62.5-25 MCG/INH AEPB Inhale 1 puff into the lungs daily.   No facility-administered encounter medications on file as of 09/11/2019.    Surgical History: Past Surgical History:  Procedure Laterality Date  . CARDIAC  CATHETERIZATION    . CATARACT EXTRACTION W/PHACO Right 09/01/2015   Procedure: CATARACT EXTRACTION PHACO AND INTRAOCULAR LENS PLACEMENT (IOC);  Surgeon: Sallee Lange, MD;  Location: [facility] ORS;  Service: Ophthalmology;  Laterality: Right;  [place] 01:33 AP% 24.9 CDE 42.99 fluid pack lot # [id number]  . CATARACT EXTRACTION W/PHACO Left 09/22/2015   Procedure: CATARACT EXTRACTION PHACO AND INTRAOCULAR LENS PLACEMENT (IOC);  Surgeon: Sallee Lange, MD;  Location: [facility] ORS;  Service: Ophthalmology;  Laterality: Left;  [place] 01:28 AP% 20.9 CDE 29.54 fluid pack lot # [id number]  . CHOLECYSTECTOMY    . EYE SURGERY    . Orbital Decompression    . TONSILLECTOMY      Medical History: Past Medical History:  Diagnosis Date  . Arthritis   . Asthma   . CHF (congestive heart failure) (HCC)   . COPD (chronic obstructive pulmonary disease) (HCC)   . Diabetes mellitus without complication (HCC)   . GERD (gastroesophageal reflux disease)   . H/O Graves' disease   . H/O wheezing   . HOH (hard of hearing)   . Hypercholesterolemia   . Hypertension   . Hypothyroidism   . Lower extremity edema   . Multiple allergies   . Palpitations   . Peripheral vascular disease (HCC)    swelling of feet and legs  . Pneumonia    in the past  . PONV (postoperative nausea and vomiting)   . Shortness of breath dyspnea   . Sleep apnea    CPAP    Family History: Family History  Problem Relation Age of Onset  . Diabetes Father     Social History: Social History   Socioeconomic History  . Marital status: Married    Spouse name: Not on file  . Number of children: Not on file  . Years of education: Not on file  . Highest education level: Not on file  Occupational History  . Not on file  Tobacco Use  . Smoking status: Former Games developer  . Smokeless tobacco: Never Used  Substance and Sexual Activity  . Alcohol use: Yes    Comment: couple glasses of wine daily  . Drug use: No  . Sexual activity: Not on file   Other Topics Concern  . Not on file  Social History Narrative  . Not on file   Social Determinants of Health   Financial Resource Strain:   . Difficulty of Paying Living Expenses:   Food Insecurity:   . Worried About Programme researcher, broadcasting/film/video in the Last Year:   . Barista in the Last Year:   Transportation Needs:   . Freight forwarder (Medical):   Marland Kitchen Lack of Transportation (Non-Medical):   Physical Activity:   . Days of Exercise per Week:   . Minutes of Exercise per Session:   Stress:   . Feeling of Stress :   Social Connections:   . Frequency of Communication with Friends and Family:   . Frequency of Social Gatherings with Friends and Family:   .  Attends Religious Services:   . Active Member of Clubs or Organizations:   . Attends Archivist Meetings:   Marland Kitchen Marital Status:   Intimate Partner Violence:   . Fear of Current or Ex-Partner:   . Emotionally Abused:   Marland Kitchen Physically Abused:   . Sexually Abused:     Vital Signs: Blood pressure (!) 164/71, pulse 71, temperature 99 F (37.2 C), resp. rate 16, height 5\' 10"  (1.778 m), weight 251 lb 6.4 oz (114 kg), SpO2 91 %.  Examination: General Appearance: The patient is well-developed, well-nourished, and in no distress. Skin: Gross inspection of skin unremarkable. Head: normocephalic, no gross deformities. Eyes: no gross deformities noted. ENT: ears appear grossly normal no exudates. Neck: Supple. No thyromegaly. No LAD. Respiratory: clear bilaterally. Cardiovascular: Normal S1 and S2 without murmur or rub. Extremities: No cyanosis. pulses are equal. Neurologic: Alert and oriented. No involuntary movements.  LABS: No results found for this or any previous visit (from the past 2160 hour(s)).  Radiology: DG Chest 2 View  Result Date: 02/08/2018 CLINICAL DATA:  Acute bronchitis with COPD EXAM: CHEST - 2 VIEW COMPARISON:  06/26/2016 FINDINGS: COPD with pulmonary hyperinflation and prominent bibasilar lung  markings. No acute infiltrate or effusion. Negative for heart failure. IMPRESSION: COPD with hyperinflation and prominent markings. No acute abnormality. Electronically Signed   By: Franchot Gallo M.D.   On: 02/08/2018 14:20    No results found.  No results found.    Assessment and Plan: Patient Active Problem List   Diagnosis Date Noted  . HYPONATREMIA, CHRONIC 03/12/2007  . PERIPHERAL NEUROPATHY 03/12/2007  . 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 HX, PERSONAL, TOBACCO USE 03/12/2007  . HYPOTHYROIDISM 03/09/2007  . DIABETES MELLITUS, TYPE II 03/09/2007  . HYPERLIPIDEMIA 03/09/2007  . HYPERTENSION 03/09/2007  . BENIGN PROSTATIC HYPERTROPHY 03/09/2007    1. Chronic obstructive pulmonary disease, unspecified COPD type (HCC) Severe disease, continue present management.  2. OSA on CPAP Good compliance and good relief of symptoms, continue to present management.   3. Chronic respiratory failure, unspecified whether with hypoxia or hypercapnia (HCC) Continue to use oxygen and take medications as directed.   4. Gastroesophageal reflux disease without esophagitis Stable, continue present management.   5. SOB (shortness of breath) FEV1 0.7 which is 23% or pre-predicted value.  - Spirometry with graph  General Counseling: I have discussed the findings of the evaluation and examination with Ousmane.  I have also discussed any further diagnostic evaluation thatmay be needed or ordered today. Evans verbalizes understanding of the findings of todays visit. We also reviewed his medications today and discussed drug interactions and side effects including but not limited excessive drowsiness and altered mental states. We also discussed that there is always a risk not just to him but also people around him. he has been encouraged to call the office with any questions or concerns that should arise related to todays visit.  Orders Placed This Encounter  Procedures  . Spirometry with graph    Order  Specific Question:   Where should this test be performed?    Answer:   [facility]     Time spent: 30 This patient was seen by Orson Gear AGNP-C in Collaboration with Dr. Devona Konig as a part of collaborative care agreement.  I have personally obtained a history, examined the patient, evaluated laboratory and imaging results, formulated the assessment and plan and placed orders.    Allyne Gee, MD [facility] Pulmonary and Critical Care Sleep medicine

## 2019-09-24 ENCOUNTER — Other Ambulatory Visit: Payer: Self-pay

## 2019-09-24 MED ORDER — ALBUTEROL SULFATE (2.5 MG/3ML) 0.083% IN NEBU: 2.5000 mg | INHALATION_SOLUTION | Freq: Four times a day (QID) | RESPIRATORY_TRACT | 1 refills | Status: DC | PRN

## 2019-12-18 ENCOUNTER — Telehealth: Payer: Self-pay

## 2019-12-18 NOTE — Telephone Encounter (Signed)
LVM CONFIRMING APPT.-AR 

## 2019-12-19 ENCOUNTER — Telehealth: Payer: Self-pay

## 2019-12-19 NOTE — Telephone Encounter (Signed)
Called lmom informing patient of appointment on 12/20/2019. klh 

## 2019-12-19 NOTE — Telephone Encounter (Signed)
Confirmed appointment on 12/20/2019 and screened for covid. klh 

## 2019-12-20 ENCOUNTER — Encounter: Payer: Self-pay | Admitting: Internal Medicine

## 2019-12-20 ENCOUNTER — Other Ambulatory Visit: Payer: Self-pay

## 2019-12-20 ENCOUNTER — Ambulatory Visit (INDEPENDENT_AMBULATORY_CARE_PROVIDER_SITE_OTHER): Payer: Medicare Other | Admitting: Internal Medicine

## 2019-12-20 VITALS — BP 142/86 | HR 58 | Temp 97.2°F | Resp 16 | Ht 70.0 in | Wt 255.2 lb

## 2019-12-20 DIAGNOSIS — G4733 Obstructive sleep apnea (adult) (pediatric): Secondary | ICD-10-CM | POA: Diagnosis not present

## 2019-12-20 DIAGNOSIS — J449 Chronic obstructive pulmonary disease, unspecified: Secondary | ICD-10-CM | POA: Diagnosis not present

## 2019-12-20 DIAGNOSIS — J9610 Chronic respiratory failure, unspecified whether with hypoxia or hypercapnia: Secondary | ICD-10-CM

## 2019-12-20 DIAGNOSIS — K219 Gastro-esophageal reflux disease without esophagitis: Secondary | ICD-10-CM

## 2019-12-20 DIAGNOSIS — Z9989 Dependence on other enabling machines and devices: Secondary | ICD-10-CM

## 2019-12-20 DIAGNOSIS — Z9981 Dependence on supplemental oxygen: Secondary | ICD-10-CM | POA: Diagnosis not present

## 2019-12-20 MED ORDER — PREDNISONE 10 MG PO TABS: ORAL_TABLET | ORAL | 2 refills | Status: DC

## 2019-12-20 NOTE — Progress Notes (Signed)
[facility] [address] [place], [place] [address]  Pulmonary Sleep Medicine   Office Visit Note  Patient Name: [name] DOB: 1936-02-07 MRN [id number]  Date of Service: 12/20/2019  Complaints/HPI: Pt is here for pulmonary follow up.  He reports continuous DOE with minimal exertion at this time.  He feels that his is worse than normal.  He has been using albuterol as needed.  He believes the hot weather is making this worse recently.  He also has uses oxygen 2 LPM continually.  He uses cpap at night with oxygen bleeding in.  He has some intermittent LE edema.    ROS  General: (-) fever, (-) chills, (-) night sweats, (-) weakness Skin: (-) rashes, (-) itching,. Eyes: (-) visual changes, (-) redness, (-) itching. Nose and Sinuses: (-) nasal stuffiness or itchiness, (-) postnasal drip, (-) nosebleeds, (-) sinus trouble. Mouth and Throat: (-) sore throat, (-) hoarseness. Neck: (-) swollen glands, (-) enlarged thyroid, (-) neck pain. Respiratory: -+cough, (-) bloody sputum, + shortness of breath, + wheezing. Cardiovascular: - ankle swelling, (-) chest pain. Lymphatic: (-) lymph node enlargement. Neurologic: (-) numbness, (-) tingling. Psychiatric: (-) anxiety, (-) depression   Current Medication: Outpatient Encounter Medications as of 12/20/2019  Medication Sig  . albuterol (PROAIR HFA) 108 (90 Base) MCG/ACT inhaler Inhale 2 puffs into the lungs every 6 (six) hours as needed for wheezing or shortness of breath.  Marland Kitchen albuterol (PROVENTIL) (2.5 MG/3ML) 0.083% nebulizer solution Take 3 mLs (2.5 mg total) by nebulization every 6 (six) hours as needed for wheezing or shortness of breath (DIAG J44.9).  Marland Kitchen aspirin EC 81 MG tablet Take 81 mg by mouth daily.  . Bromfenac Sodium (BROMSITE) 0.075 % SOLN Apply 1 drop to eye 2 (two) times daily.  . carvedilol (COREG) 25 MG tablet Take 25 mg by mouth 2 (two) times daily with a meal.  . cholecalciferol (VITAMIN D) 1000 units tablet  Take 1,000 Units by mouth at bedtime.   . Difluprednate (DUREZOL) 0.05 % EMUL Apply 1 drop to eye 2 (two) times daily.  . ergocalciferol (VITAMIN D2) 50000 units capsule Take 50,000 Units by mouth once a week.  . Exenatide ER (BYDUREON) 2 MG PEN Inject into the skin once a week.  . fexofenadine (ALLEGRA) 180 MG tablet Take 180 mg by mouth daily.  . furosemide (LASIX) 20 MG tablet Take 20 mg by mouth 2 (two) times daily.  Marland Kitchen gabapentin (NEURONTIN) 600 MG tablet Take 600 mg by mouth 2 (two) times daily.  Marland Kitchen glipiZIDE (GLUCOTROL) 5 MG tablet Take 5 mg by mouth daily before breakfast.  . JARDIANCE 10 MG TABS tablet Take 10 mg by mouth daily.  Marland Kitchen levothyroxine (SYNTHROID, LEVOTHROID) 137 MCG tablet Take 137 mcg by mouth daily before breakfast.  . magnesium oxide (MAG-OX) 400 MG tablet Take 400 mg by mouth 2 (two) times daily.  . montelukast (SINGULAIR) 10 MG tablet Take 10 mg by mouth at bedtime.  . nepafenac (ILEVRO) 0.3 % ophthalmic suspension 1 drop at bedtime.  . OXYGEN Inhale into the lungs. 2 litre at night  . pravastatin (PRAVACHOL) 20 MG tablet Take 20 mg by mouth at bedtime.  . predniSONE (DELTASONE) 5 MG tablet Use as directed for 6 days  . quinapril (ACCUPRIL) 40 MG tablet Take 20 mg by mouth at bedtime.  . ranitidine (ZANTAC) 150 MG tablet Take 150 mg by mouth at bedtime.  . Travoprost, BAK Free, (TRAVATAN) 0.004 % SOLN ophthalmic solution 1 drop at  bedtime.  . TRELEGY ELLIPTA 100-62.5-25 MCG/INH AEPB Inhale 1 puff into the lungs daily.  Marland Kitchen azithromycin (ZITHROMAX) 250 MG tablet As directed (Patient not taking: Reported on 12/20/2019)   No facility-administered encounter medications on file as of 12/20/2019.    Surgical History: Past Surgical History:  Procedure Laterality Date  . CARDIAC CATHETERIZATION    . CATARACT EXTRACTION W/PHACO Right 09/01/2015   Procedure: CATARACT EXTRACTION PHACO AND INTRAOCULAR LENS PLACEMENT (IOC);  Surgeon: Sallee Lange, MD;  Location: [facility] ORS;   Service: Ophthalmology;  Laterality: Right;  [place] 01:33 AP% 24.9 CDE 42.99 fluid pack lot # [id number]  . CATARACT EXTRACTION W/PHACO Left 09/22/2015   Procedure: CATARACT EXTRACTION PHACO AND INTRAOCULAR LENS PLACEMENT (IOC);  Surgeon: Sallee Lange, MD;  Location: [facility] ORS;  Service: Ophthalmology;  Laterality: Left;  [place] 01:28 AP% 20.9 CDE 29.54 fluid pack lot # [id number]  . CHOLECYSTECTOMY    . EYE SURGERY    . Orbital Decompression    . TONSILLECTOMY      Medical History: Past Medical History:  Diagnosis Date  . Arthritis   . Asthma   . CHF (congestive heart failure) (HCC)   . COPD (chronic obstructive pulmonary disease) (HCC)   . Diabetes mellitus without complication (HCC)   . GERD (gastroesophageal reflux disease)   . H/O Graves' disease   . H/O wheezing   . HOH (hard of hearing)   . Hypercholesterolemia   . Hypertension   . Hypothyroidism   . Lower extremity edema   . Multiple allergies   . Palpitations   . Peripheral vascular disease (HCC)    swelling of feet and legs  . Pneumonia    in the past  . PONV (postoperative nausea and vomiting)   . Shortness of breath dyspnea   . Sleep apnea    CPAP    Family History: Family History  Problem Relation Age of Onset  . Diabetes Father     Social History: Social History   Socioeconomic History  . Marital status: Married    Spouse name: Not on file  . Number of children: Not on file  . Years of education: Not on file  . Highest education level: Not on file  Occupational History  . Not on file  Tobacco Use  . Smoking status: Former Games developer  . Smokeless tobacco: Never Used  Substance and Sexual Activity  . Alcohol use: Yes    Comment: couple glasses of wine daily  . Drug use: No  . Sexual activity: Not on file  Other Topics Concern  . Not on file  Social History Narrative  . Not on file   Social Determinants of Health   Financial Resource Strain:   . Difficulty of Paying Living Expenses:   Food  Insecurity:   . Worried About Programme researcher, broadcasting/film/video in the Last Year:   . Barista in the Last Year:   Transportation Needs:   . Freight forwarder (Medical):   Marland Kitchen Lack of Transportation (Non-Medical):   Physical Activity:   . Days of Exercise per Week:   . Minutes of Exercise per Session:   Stress:   . Feeling of Stress :   Social Connections:   . Frequency of Communication with Friends and Family:   . Frequency of Social Gatherings with Friends and Family:   . Attends Religious Services:   . Active Member of Clubs or Organizations:   . Attends Banker Meetings:   Marland Kitchen Marital  Status:   Intimate Partner Violence:   . Fear of Current or Ex-Partner:   . Emotionally Abused:   Marland Kitchen Physically Abused:   . Sexually Abused:     Vital Signs: Blood pressure (!) 142/86, pulse (!) 58, temperature (!) 97.2 F (36.2 C), resp. rate 16, height 5\' 10"  (1.778 m), weight 255 lb 3.2 oz (115.8 kg), SpO2 95 %.  Examination: General Appearance: The patient is well-developed, well-nourished, and in no distress. Skin: Gross inspection of skin unremarkable. Head: normocephalic, no gross deformities. Eyes: no gross deformities noted. ENT: ears appear grossly normal no exudates. Neck: Supple. No thyromegaly. No LAD. Respiratory: diminished but clear. Cardiovascular: Normal S1 and S2 without murmur or rub. Extremities: No cyanosis. pulses are equal. Neurologic: Alert and oriented. No involuntary movements.  LABS: No results found for this or any previous visit (from the past 2160 hour(s)).  Radiology: DG Chest 2 View  Result Date: 02/08/2018 CLINICAL DATA:  Acute bronchitis with COPD EXAM: CHEST - 2 VIEW COMPARISON:  06/26/2016 FINDINGS: COPD with pulmonary hyperinflation and prominent bibasilar lung markings. No acute infiltrate or effusion. Negative for heart failure. IMPRESSION: COPD with hyperinflation and prominent markings. No acute abnormality. Electronically Signed   By:  06/28/2016 M.D.   On: 02/08/2018 14:20    No results found.  No results found.    Assessment and Plan: Patient Active Problem List   Diagnosis Date Noted  . HYPONATREMIA, CHRONIC 03/12/2007  . PERIPHERAL NEUROPATHY 03/12/2007  . 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 HX, PERSONAL, TOBACCO USE 03/12/2007  . HYPOTHYROIDISM 03/09/2007  . DIABETES MELLITUS, TYPE II 03/09/2007  . HYPERLIPIDEMIA 03/09/2007  . HYPERTENSION 03/09/2007  . BENIGN PROSTATIC HYPERTROPHY 03/09/2007    1. Chronic obstructive pulmonary disease, unspecified COPD type (HCC) Continue to use prednisone as directed.  - predniSONE (DELTASONE) 10 MG tablet; Use per dose pack  Dispense: 30 tablet; Refill: 2  2. OSA on CPAP Continue to use cpap as directed.   3. Chronic respiratory failure, unspecified whether with hypoxia or hypercapnia (HCC) Continue with current management at this time.  Continue to use inhalers and oxygen as prescribed.    4. Oxygen dependent Continue to use oxygen as directed.   5. Gastroesophageal reflux disease without esophagitis Continue with GERD meds as discussed.   General Counseling: I have discussed the findings of the evaluation and examination with [place].  I have also discussed any further diagnostic evaluation thatmay be needed or ordered today. Theoplis verbalizes understanding of the findings of todays visit. We also reviewed his medications today and discussed drug interactions and side effects including but not limited excessive drowsiness and altered mental states. We also discussed that there is always a risk not just to him but also people around him. he has been encouraged to call the office with any questions or concerns that should arise related to todays visit.  No orders of the defined types were placed in this encounter.    Time spent: 30 This patient was seen by 05/09/2007 AGNP-C in Collaboration with Dr. Blima Ledger as a part of collaborative care agreement.   I  have personally obtained a history, examined the patient, evaluated laboratory and imaging results, formulated the assessment and plan and placed orders.    Freda Munro, MD [facility] Pulmonary and Critical Care Sleep medicine

## 2019-12-21 ENCOUNTER — Other Ambulatory Visit: Payer: Self-pay

## 2019-12-21 ENCOUNTER — Telehealth: Payer: Self-pay

## 2019-12-21 DIAGNOSIS — J449 Chronic obstructive pulmonary disease, unspecified: Secondary | ICD-10-CM

## 2019-12-21 DIAGNOSIS — J9610 Chronic respiratory failure, unspecified whether with hypoxia or hypercapnia: Secondary | ICD-10-CM

## 2019-12-21 MED ORDER — PREDNISONE 10 MG PO TABS: ORAL_TABLET | ORAL | 2 refills | Status: DC

## 2019-12-24 DIAGNOSIS — E1121 Type 2 diabetes mellitus with diabetic nephropathy: Secondary | ICD-10-CM | POA: Insufficient documentation

## 2019-12-24 DIAGNOSIS — N183 Chronic kidney disease, stage 3 unspecified: Secondary | ICD-10-CM | POA: Insufficient documentation

## 2019-12-24 DIAGNOSIS — I5022 Chronic systolic (congestive) heart failure: Secondary | ICD-10-CM | POA: Insufficient documentation

## 2019-12-24 DIAGNOSIS — E871 Hypo-osmolality and hyponatremia: Secondary | ICD-10-CM | POA: Insufficient documentation

## 2019-12-24 DIAGNOSIS — N1831 Chronic kidney disease, stage 3a: Secondary | ICD-10-CM | POA: Insufficient documentation

## 2019-12-25 NOTE — Telephone Encounter (Signed)
done

## 2020-02-05 ENCOUNTER — Ambulatory Visit (INDEPENDENT_AMBULATORY_CARE_PROVIDER_SITE_OTHER): Payer: Medicare Other | Admitting: Internal Medicine

## 2020-02-05 ENCOUNTER — Other Ambulatory Visit: Payer: Self-pay | Admitting: Hospice and Palliative Medicine

## 2020-02-05 ENCOUNTER — Encounter: Payer: Self-pay | Admitting: Internal Medicine

## 2020-02-05 ENCOUNTER — Other Ambulatory Visit: Payer: Self-pay

## 2020-02-05 DIAGNOSIS — R0602 Shortness of breath: Secondary | ICD-10-CM | POA: Diagnosis not present

## 2020-02-05 DIAGNOSIS — Z9981 Dependence on supplemental oxygen: Secondary | ICD-10-CM

## 2020-02-05 DIAGNOSIS — G4733 Obstructive sleep apnea (adult) (pediatric): Secondary | ICD-10-CM | POA: Diagnosis not present

## 2020-02-05 DIAGNOSIS — J449 Chronic obstructive pulmonary disease, unspecified: Secondary | ICD-10-CM | POA: Diagnosis not present

## 2020-02-05 DIAGNOSIS — J9611 Chronic respiratory failure with hypoxia: Secondary | ICD-10-CM | POA: Diagnosis not present

## 2020-02-05 MED ORDER — IPRATROPIUM-ALBUTEROL 0.5-2.5 (3) MG/3ML IN SOLN: 3.0000 mL | RESPIRATORY_TRACT | 3 refills | Status: DC | PRN

## 2020-02-05 MED ORDER — DALIRESP 250 MCG PO TABS: 250.0000 ug | ORAL_TABLET | Freq: Every day | ORAL | 1 refills | Status: DC

## 2020-02-05 MED ORDER — TRELEGY ELLIPTA 100-62.5-25 MCG/INH IN AEPB: 1.0000 | INHALATION_SPRAY | Freq: Every day | RESPIRATORY_TRACT | 2 refills | Status: DC

## 2020-02-05 NOTE — Progress Notes (Addendum)
[facility] [address] [place], [place] [id number]  Pulmonary Sleep Medicine   Office Visit Note  Patient Name: Aaron JensenJohn L [name] DOB: June 01, 1936 MRN [id number]  Date of Service: 02/21/2020  Complaints/HPI: Patient is here for routine pulmonary follow-up. Over the last 2-3 weeks he has noticed a significant decline in his breathing. He has increased shortness of breath, decreased reserve and a productive cough. He is currently dependent upon supplemental oxygen, currently wearing 2LPM via [place]. He mentions today that he feels that his health is declining, he is interested in talking about services that would benefit him, he would like to see if he could get a hospital bed as he has been sleeping in his recliner most nights to help with his breathing   ROS  General: (-) fever, (-) chills, (-) night sweats, (-) weakness Skin: (-) rashes, (-) itching,. Eyes: (-) visual changes, (-) redness, (-) itching. Nose and Sinuses: (-) nasal stuffiness or itchiness, (-) postnasal drip, (-) nosebleeds, (-) sinus trouble. Mouth and Throat: (-) sore throat, (-) hoarseness. Neck: (-) swollen glands, (-) enlarged thyroid, (-) neck pain. Respiratory: + cough, (-) bloody sputum, + shortness of breath, - wheezing. Cardiovascular: - ankle swelling, (-) chest pain. Lymphatic: (-) lymph node enlargement. Neurologic: (-) numbness, (-) tingling. Psychiatric: (-) anxiety, (-) depression   Current Medication: Outpatient Encounter Medications as of 02/05/2020  Medication Sig   albuterol (PROAIR HFA) 108 (90 Base) MCG/ACT inhaler Inhale 2 puffs into the lungs every 6 (six) hours as needed for wheezing or shortness of breath.   aspirin EC 81 MG tablet Take 81 mg by mouth daily.   Bromfenac Sodium (BROMSITE) 0.075 % SOLN Apply 1 drop to eye 2 (two) times daily.   carvedilol (COREG) 25 MG tablet Take 25 mg by mouth 2 (two) times daily with a meal.   cephALEXin (KEFLEX) 500 MG capsule Take 500 mg  by mouth every 6 (six) hours.   Difluprednate (DUREZOL) 0.05 % EMUL Apply 1 drop to eye 2 (two) times daily.   ergocalciferol (VITAMIN D2) 50000 units capsule Take 50,000 Units by mouth once a week.   Exenatide ER (BYDUREON) 2 MG PEN Inject into the skin once a week.   fexofenadine (ALLEGRA) 180 MG tablet Take 180 mg by mouth daily.   furosemide (LASIX) 20 MG tablet Take 20 mg by mouth 2 (two) times daily.   gabapentin (NEURONTIN) 600 MG tablet Take 600 mg by mouth 2 (two) times daily.   glipiZIDE (GLUCOTROL) 5 MG tablet Take 5 mg by mouth daily before breakfast.   JARDIANCE 10 MG TABS tablet Take 10 mg by mouth daily.   levothyroxine (SYNTHROID, LEVOTHROID) 137 MCG tablet Take 137 mcg by mouth daily before breakfast.   magnesium oxide (MAG-OX) 400 MG tablet Take 400 mg by mouth 2 (two) times daily.   montelukast (SINGULAIR) 10 MG tablet Take 10 mg by mouth at bedtime.   OXYGEN Inhale into the lungs. 2 litre at night   pravastatin (PRAVACHOL) 20 MG tablet Take 20 mg by mouth at bedtime.   predniSONE (DELTASONE) 10 MG tablet Take 10 mg daily   predniSONE (DELTASONE) 5 MG tablet Use as directed for 6 days   quinapril (ACCUPRIL) 40 MG tablet Take 20 mg by mouth at bedtime.   Travoprost, BAK Free, (TRAVATAN) 0.004 % SOLN ophthalmic solution 1 drop at bedtime.   TRELEGY ELLIPTA 100-62.5-25 MCG/INH AEPB Inhale 1 puff into the lungs daily.   [DISCONTINUED] albuterol (PROVENTIL) (2.5 MG/3ML) 0.083% nebulizer  solution Take 3 mLs (2.5 mg total) by nebulization every 6 (six) hours as needed for wheezing or shortness of breath (DIAG J44.9).   [DISCONTINUED] ipratropium-albuterol (DUONEB) 0.5-2.5 (3) MG/3ML SOLN Take 3 mLs by nebulization every 4 (four) hours as needed. DX-J44.9   [DISCONTINUED] TRELEGY ELLIPTA 100-62.5-25 MCG/INH AEPB Inhale 1 puff into the lungs daily.   cholecalciferol (VITAMIN D) 1000 units tablet Take 1,000 Units by mouth at bedtime.  (Patient not taking: Reported  on 02/05/2020)   nepafenac (ILEVRO) 0.3 % ophthalmic suspension 1 drop at bedtime. (Patient not taking: Reported on 02/05/2020)   ranitidine (ZANTAC) 150 MG tablet Take 150 mg by mouth at bedtime. (Patient not taking: Reported on 02/05/2020)   Roflumilast (DALIRESP) 250 MCG TABS Take 250 mcg by mouth daily.   [DISCONTINUED] azithromycin (ZITHROMAX) 250 MG tablet As directed (Patient not taking: Reported on 02/05/2020)   No facility-administered encounter medications on file as of 02/05/2020.    Surgical History: Past Surgical History:  Procedure Laterality Date   CARDIAC CATHETERIZATION     CATARACT EXTRACTION W/PHACO Right 09/01/2015   Procedure: CATARACT EXTRACTION PHACO AND INTRAOCULAR LENS PLACEMENT (IOC);  Surgeon: Sallee Lange, MD;  Location: [facility] ORS;  Service: Ophthalmology;  Laterality: Right;  [place] 01:33 AP% 24.9 CDE 42.99 fluid pack lot # [id number]   CATARACT EXTRACTION W/PHACO Left 09/22/2015   Procedure: CATARACT EXTRACTION PHACO AND INTRAOCULAR LENS PLACEMENT (IOC);  Surgeon: Sallee Lange, MD;  Location: [facility] ORS;  Service: Ophthalmology;  Laterality: Left;  [place] 01:28 AP% 20.9 CDE 29.54 fluid pack lot # [id number]   CHOLECYSTECTOMY     EYE SURGERY     Orbital Decompression     TONSILLECTOMY      Medical History: Past Medical History:  Diagnosis Date   Arthritis    Asthma    CHF (congestive heart failure) (HCC)    COPD (chronic obstructive pulmonary disease) (HCC)    Diabetes mellitus without complication (HCC)    GERD (gastroesophageal reflux disease)    H/O Graves' disease    H/O wheezing    HOH (hard of hearing)    Hypercholesterolemia    Hypertension    Hypothyroidism    Lower extremity edema    Multiple allergies    Palpitations    Peripheral vascular disease (HCC)    swelling of feet and legs   Pneumonia    in the past   PONV (postoperative nausea and vomiting)    Shortness of breath dyspnea    Sleep apnea    CPAP     Family History: Family History  Problem Relation Age of Onset   Diabetes Father     Social History: Social History   Socioeconomic History   Marital status: Married    Spouse name: Not on file   Number of children: Not on file   Years of education: Not on file   Highest education level: Not on file  Occupational History   Not on file  Tobacco Use   Smoking status: Former Smoker   Smokeless tobacco: Never Used  Substance and Sexual Activity   Alcohol use: Yes    Comment: couple glasses of wine daily   Drug use: No   Sexual activity: Not on file  Other Topics Concern   Not on file  Social History Narrative   Not on file   Social Determinants of Health   Financial Resource Strain:    Difficulty of Paying Living Expenses: Not on file  Food Insecurity:    Worried  About Running Out of Food in the Last Year: Not on file   Ran Out of Food in the Last Year: Not on file  Transportation Needs:    Lack of Transportation (Medical): Not on file   Lack of Transportation (Non-Medical): Not on file  Physical Activity:    Days of Exercise per Week: Not on file   Minutes of Exercise per Session: Not on file  Stress:    Feeling of Stress : Not on file  Social Connections:    Frequency of Communication with Friends and Family: Not on file   Frequency of Social Gatherings with Friends and Family: Not on file   Attends Religious Services: Not on file   Active Member of Clubs or Organizations: Not on file   Attends Banker Meetings: Not on file   Marital Status: Not on file  Intimate Partner Violence:    Fear of Current or Ex-Partner: Not on file   Emotionally Abused: Not on file   Physically Abused: Not on file   Sexually Abused: Not on file    Vital Signs: Blood pressure 121/63, pulse 83, temperature 97.9 F (36.6 C), resp. rate 16, height 5\' 10"  (1.778 m), weight 256 lb (116.1 kg), SpO2 (!) 85 %.  Examination: General  Appearance: The patient is well-developed, well-nourished, and in no distress. Skin: Gross inspection of skin unremarkable. Head: normocephalic, no gross deformities. Eyes: no gross deformities noted. ENT: ears appear grossly normal no exudates. Neck: Supple. No thyromegaly. No LAD. Respiratory: Diminished throughout, wheezing at bases, no rhonchi. Cardiovascular: Normal S1 and S2 without murmur or rub. Extremities: No cyanosis. pulses are equal. Neurologic: Alert and oriented. No involuntary movements.  LABS: No results found for this or any previous visit (from the past 2160 hour(s)).  Radiology: DG Chest 2 View  Result Date: 02/08/2018 CLINICAL DATA:  Acute bronchitis with COPD EXAM: CHEST - 2 VIEW COMPARISON:  06/26/2016 FINDINGS: COPD with pulmonary hyperinflation and prominent bibasilar lung markings. No acute infiltrate or effusion. Negative for heart failure. IMPRESSION: COPD with hyperinflation and prominent markings. No acute abnormality. Electronically Signed   By: 06/28/2016 M.D.   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 Chronic obstructive pulmonary disease, unspecified COPD type (HCC) Discussed with him that his COPD is end-stage. Will add Daliresp and duo-nebs to help improve his symptoms. Will look into getting him qualified for NIV at night to help also with daily symptoms as he is high risk of CO2 retention. At this stage in his disease, CPAP is not sufficient in reducing CO2 levels. May need to begin discussions about care planning and goals of care at next follow-up visit. - Roflumilast  (DALIRESP) 250 MCG TABS; Take 250 mcg by mouth daily.  Dispense: 30 tablet; Refill: 1 - TRELEGY ELLIPTA 100-62.5-25 MCG/INH AEPB; Inhale 1 puff into the lungs daily.  Dispense: 3 each; Refill: 2  2. Obstructive sleep apnea syndrome Continue with nightly compliance with CPAP use. Continue cleaning chamber weekly with warm water and vinegar and allowing to dry completely each day. At this stage in his disease, CPAP is not sufficient at reducing his CO2  levels.  3. Chronic Respiratory Failure with hypoxia, on home O2 therapy Long standing COPD history, now at end stage. High risk of hypoxia, continue with current supplemental O2 settings. Will greatly benefit from NIV therapy due to his high risk of hypercapnia increasing his risk of hospitalization and death due to CPAP not being sufficient in reducing his CO2 levels.  4. SOB (shortness of breath) - Pulmonary rehab therapeutic exercise; Future - ECHOCARDIOGRAM COMPLETE; Future  5. Face to face encounter for hospital bed Will proceed with getting Onie a hospital bed, he would benefit greatly from this service. He is now sleeping most nights in his recliner due to his inability to breathe when lying flat. With him sleeping in his recliner he is at an increased risk of pressure injury. The beneficiary requires the head of the bed to be elevated more than 30 degrees most of the time due to Chronic Pulmonary disease. The Beneficiary also requires frequent changes in body position and/or has an immediate need for a change in body position, which is why a semi electric hospital bed is needed.    General Counseling: I have discussed the findings of the evaluation and examination with Conor.  I have also discussed any further diagnostic evaluation thatmay be needed or ordered today. Miken verbalizes understanding of the findings of todays visit. We also reviewed his medications today and discussed drug interactions and side effects including but not limited  excessive drowsiness and altered mental states. We also discussed that there is always a risk not just to him but also people around him. he has been encouraged to call the office with any questions or concerns that should arise related to todays visit.  Orders Placed This Encounter  Procedures   For home use only DME Hospital bed    Order Specific Question:   Length of Need    Answer:   Lifetime    Order Specific Question:   Bed type    Answer:   Semi-electric   Pulmonary rehab therapeutic exercise    Standing Status:   Future    Standing Expiration Date:   02/04/2021   ECHOCARDIOGRAM COMPLETE    Standing Status:   Future    Standing Expiration Date:   02/04/2021    Order Specific Question:   Where should this test be performed    Answer:   External    Order Specific Question:   Perflutren DEFINITY (image enhancing agent) should be administered unless hypersensitivity or allergy exist    Answer:   Administer Perflutren    Order Specific Question:   Reason for exam-Echo    Answer:   Dyspnea  786.09 / R06.00     Time spent: 30  I have personally obtained a history, examined the patient, evaluated laboratory and imaging results, formulated the assessment and plan and placed orders. This patient was seen by Brent General AGNP-C in Collaboration with Dr. Freda Munro as a part of collaborative care agreement.    Yevonne Pax, MD [facility] Pulmonary and Critical Care Sleep medicine

## 2020-02-06 NOTE — Patient Instructions (Signed)

## 2020-02-08 ENCOUNTER — Telehealth: Payer: Self-pay

## 2020-02-08 NOTE — Telephone Encounter (Signed)
Authorization approved for Daliresp 250 MG tablet from 02/07/2020 through 06/06/2020 [facility]

## 2020-02-13 ENCOUNTER — Telehealth: Payer: Self-pay

## 2020-02-13 ENCOUNTER — Other Ambulatory Visit: Payer: Self-pay

## 2020-02-13 ENCOUNTER — Other Ambulatory Visit: Payer: Self-pay | Admitting: Hospice and Palliative Medicine

## 2020-02-13 DIAGNOSIS — J9611 Chronic respiratory failure with hypoxia: Secondary | ICD-10-CM

## 2020-02-13 DIAGNOSIS — J9610 Chronic respiratory failure, unspecified whether with hypoxia or hypercapnia: Secondary | ICD-10-CM

## 2020-02-13 NOTE — Telephone Encounter (Signed)
Called and spoke with Mr. Schurman regarding a message he sent Tresa Endo with [facility]. I have placed an order and will get him set up with NIV therapy.

## 2020-02-13 NOTE — Telephone Encounter (Signed)
Spoke with christy [phone]  As per harris taylor gave verbal order for hospice

## 2020-02-14 ENCOUNTER — Ambulatory Visit: Payer: Medicare Other | Admitting: Internal Medicine

## 2020-02-14 ENCOUNTER — Telehealth: Payer: Self-pay

## 2020-02-14 NOTE — Telephone Encounter (Signed)
Hospice order signed by provider and faxed back to [facility]. Placed in scan.

## 2020-02-14 NOTE — Telephone Encounter (Signed)
Gave verbal order [id number] for palliative care due to pt evaluate by hospice

## 2020-02-15 ENCOUNTER — Other Ambulatory Visit: Payer: Medicare Other

## 2020-02-15 NOTE — Telephone Encounter (Signed)
Gave verbal order for palliative care

## 2020-02-18 ENCOUNTER — Telehealth: Payer: Self-pay

## 2020-02-18 NOTE — Telephone Encounter (Signed)
Spoke with patient's wife to follow up with Palliative care referral. Verbal consent obtained for Palliative care. Wife, Dewayne Hatch, shared that patient has been in a recliner in the den due to increased shortness of breath and anxiety. Visit requested ASAP. Scheduled for 02/19/20 @ 8:30am.

## 2020-02-19 ENCOUNTER — Other Ambulatory Visit: Payer: Self-pay | Admitting: Primary Care

## 2020-02-19 ENCOUNTER — Encounter: Payer: Self-pay | Admitting: Nurse Practitioner

## 2020-02-19 ENCOUNTER — Other Ambulatory Visit: Payer: Medicare Other | Admitting: Nurse Practitioner

## 2020-02-19 ENCOUNTER — Other Ambulatory Visit: Payer: Self-pay

## 2020-02-19 DIAGNOSIS — Z515 Encounter for palliative care: Secondary | ICD-10-CM

## 2020-02-19 DIAGNOSIS — I5030 Unspecified diastolic (congestive) heart failure: Secondary | ICD-10-CM

## 2020-02-19 NOTE — Progress Notes (Signed)
Therapist, nutritional Palliative Care Consult Note Telephone: [phone]  Fax: [phone]  PATIENT NAME: [name] DOB: 1935/10/21 MRN: [id number]  PRIMARY CARE PROVIDER:   Alan Mulder, MD  REFERRING PROVIDER:  Alan Mulder, MD [address] [place],  [place] [address]   I was asked by Dr [name] to see [name] for Palliative Care consult for goals of care  RESPONSIBLE PARTY:   Self; [name] 1. Advance Care Planning; DNR; MOST form completed, placed in vynca  2. Goals of Care: Goals include to maximize quality of life and symptom management. Our advance care planning conversation included a discussion about:     The value and importance of advance care planning   Exploration of personal, cultural or spiritual beliefs that might influence medical decisions   Exploration of goals of care in the event of a sudden injury or illness   Identification and preparation of a healthcare agent   Review and updating or creation of an  advance directive document.  3. Palliative care encounter; Palliative care encounter; Palliative medicine team will continue to support patient, patient's family, and medical team. Visit consisted of counseling and education dealing with the complex and emotionally intense issues of symptom management and palliative care in the setting of serious and potentially life-threatening illness  4. f/u with return phone call from [name] with plan to either switch to bipap with hospice; continue PC  I spent 90 minutes providing this consultation,  from 8:30am to 10:00am  More than 50% of the time in this consultation was spent coordinating communication.   HISTORY OF PRESENT ILLNESS:  [name] is a 84 y.o. year old male with multiple medical problems including Congestive heart failure, COPD, diabetes, referral vascular disease, hypothyroidism, hypertension, history of graves disease, asthma, arthritis, GERD,  peyronie's disease, BPH, history of tobacco use, hyperlipidemia, history of hyponatremia, tonsillectomy, orbital decompression, cholecystectomy, bilateral cataract extraction with phaco. On 2L continuous oxygen. 7 / 15 / 2021 Pulmonary visit, Dr Park Breed DOE with minimal exertion using Albuterol, CPAP at night prescribe prednisone at this visit. Nephrology visit 8/4 / 2021 for chronic kidney disease stage III secondary hypertension, congestive heart failure, diabetes discuss stopping quinapril. Pulmonologist 8/31 2021 Dr Park Breed COPD, End Stage added daliresp and duoneb to improve symptoms. Obstructive sleep apnea syndrome continuous CPAP. Chronic respiratory failure with hypoxia end stage.  In-person palliative care visit with [name] and [name], his wife. We talked about purpose of palliative care visit and in agreement. We talked about how [name] was feeling today. [name] endorses he has had ongoing decline especially over the last few months. [name] talked about the last time he was independent which was more than 5 years ago he lost a considerable amount of weight and was doing well. [name] developed multiple bouts of pneumonia with exacerbations requiring him to have continuous oxygen. We talked about his overall current clinical condition. [name] endorses they recently brought out a Trilogy machine which seems to help him at night which is when he uses that. [name] endorses he continues to require to have his oxygen. We talked about his functional level with recently has significantly declined. [name] endorses he takes 3 to 5 steps he becomes short of breath and if attempts to walked up two stairs he is gasping for breath. [name] endorses his anxiety increases at that time and he has to rest and it does take some  time for him to recover. [name] endorses if he is sitting in the recliner doing nothing his breathing is better. [name] talked about challenges with limitations. We  talked about chronic disease progression. We talked about realistic expectation. [name] talked about dying with overall decline he has been thinking about that. We talked about coping strategies.  We talked about his appetite. We talked about symptoms of shortness of breath, managing in pain. We talked about symptoms of anxiety that seems to happen when he becomes very short of breath though relieved when he rests. We talked about medications. Currently he is not on an antidepressant. We talked about starting Celexa which will help with depression and anxiety so explain that it will take a little bit of time to get in his system. [name] in agreement to trying and will send in prescription for primary to continue if effective.We talked about medical goals of care including aggressive vs conservative versus comfort care. We talked about living will. We talked about his status as [name] endorses he wants to be a do not resuscitate. [name] form completed. We talked about MOST form. [name] endorses he wishes not to be on a ventilator or a feeding tube but wishes are for IV therapy and antibiotics. MOST form completed and will put both in vynca. 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 [name] talked about doing CPR on one person and that was very hard he would not want that for himself. We talked about Hospice Services Under Medicare benefits. [name] and [name] endorses Hospice did come out though with the Trilogy they were not able to put him on service. We talked about [name] has a chair existence. [name] has been sleeping in the recliner for the last few weeks. Mrs. Anthonette [name] endorse is she feels like [name] has developed a wound on his backside. We talked about options as far as switching Trilogy to BiPAP and then would be eligible for Hospice Services. We talked about what Hospice Services would provide. We conference called the Trilogy  representative Tresa EndoKelly to further discuss option of trying BiPAP at home rather than trilogy for a few nights to see if that keeps him comfortable. Tresa EndoKelly endorses she would have to further discuss with her supervisor. Explained to Gl[place]ilKelly that way Hospice would be able to provide services. Tresa EndoKelly endorses there are other hospices at to take patients on Trilogy. Tresa EndoKelly endorses she would get back with [name] and Aaron Holenne with what she finds out about the BiPAP. [name] and I talked about goals of care with focus comfort at home. We talked about role palliative care and plan of care. [name] endorses they will follow up with Tresa EndoKelly to find out about the option of the BiPAP machine and option of Hospice has that would take Trilogy. Should they choose to remain on Trilogy without Hospice will proceed with obtaining orders for a hospital bed, Home Health nursing for wound care. [name] in agreement. Will wait to hear their decisions. Therapeutic listening and emotional support provided. Contact information. Questions answered to satisfaction.  Celexa 20mg  po daily; no rf; #30 dx anxiety/depression  Palliative Care was asked to help address goals of care.   CODE STATUS: DNR  PPS: 40% HOSPICE ELIGIBILITY/DIAGNOSIS: TBD  PAST MEDICAL HISTORY:  Past Medical History:  Diagnosis Date  . Arthritis   . Asthma   . CHF (  congestive heart failure) (HCC)   . COPD (chronic obstructive pulmonary disease) (HCC)   . Diabetes mellitus without complication (HCC)   . GERD (gastroesophageal reflux disease)   . H/O Graves' disease   . H/O wheezing   . HOH (hard of hearing)   . Hypercholesterolemia   . Hypertension   . Hypothyroidism   . Lower extremity edema   . Multiple allergies   . Palpitations   . Peripheral vascular disease (HCC)    swelling of feet and legs  . Pneumonia    in the past  . PONV (postoperative nausea and vomiting)   . Shortness of breath dyspnea   . Sleep apnea    CPAP    SOCIAL HX:    Social History   Tobacco Use  . Smoking status: Former Games developer  . Smokeless tobacco: Never Used  Substance Use Topics  . Alcohol use: Yes    Comment: couple glasses of wine daily    ALLERGIES:  Allergies  Allergen Reactions  . Lodine [Etodolac] Itching  . Cefaclor Rash     PERTINENT MEDICATIONS:  Outpatient Encounter Medications as of 02/19/2020  Medication Sig  . albuterol (PROAIR HFA) 108 (90 Base) MCG/ACT inhaler Inhale 2 puffs into the lungs every 6 (six) hours as needed for wheezing or shortness of breath.  Marland Kitchen aspirin EC 81 MG tablet Take 81 mg by mouth daily.  . Bromfenac Sodium (BROMSITE) 0.075 % SOLN Apply 1 drop to eye 2 (two) times daily.  . carvedilol (COREG) 25 MG tablet Take 25 mg by mouth 2 (two) times daily with a meal.  . cephALEXin (KEFLEX) 500 MG capsule Take 500 mg by mouth every 6 (six) hours.  . cholecalciferol (VITAMIN D) 1000 units tablet Take 1,000 Units by mouth at bedtime.  (Patient not taking: Reported on 02/05/2020)  . Difluprednate (DUREZOL) 0.05 % EMUL Apply 1 drop to eye 2 (two) times daily.  . ergocalciferol (VITAMIN D2) 50000 units capsule Take 50,000 Units by mouth once a week.  . Exenatide ER (BYDUREON) 2 MG PEN Inject into the skin once a week.  . fexofenadine (ALLEGRA) 180 MG tablet Take 180 mg by mouth daily.  . furosemide (LASIX) 20 MG tablet Take 20 mg by mouth 2 (two) times daily.  Marland Kitchen gabapentin (NEURONTIN) 600 MG tablet Take 600 mg by mouth 2 (two) times daily.  Marland Kitchen glipiZIDE (GLUCOTROL) 5 MG tablet Take 5 mg by mouth daily before breakfast.  . ipratropium-albuterol (DUONEB) 0.5-2.5 (3) MG/3ML SOLN Take 3 mLs by nebulization every 4 (four) hours as needed. DX-J44.9  . JARDIANCE 10 MG TABS tablet Take 10 mg by mouth daily.  Marland Kitchen levothyroxine (SYNTHROID, LEVOTHROID) 137 MCG tablet Take 137 mcg by mouth daily before breakfast.  . magnesium oxide (MAG-OX) 400 MG tablet Take 400 mg by mouth 2 (two) times daily.  . montelukast (SINGULAIR) 10 MG tablet  Take 10 mg by mouth at bedtime.  . nepafenac (ILEVRO) 0.3 % ophthalmic suspension 1 drop at bedtime. (Patient not taking: Reported on 02/05/2020)  . OXYGEN Inhale into the lungs. 2 litre at night  . pravastatin (PRAVACHOL) 20 MG tablet Take 20 mg by mouth at bedtime.  . predniSONE (DELTASONE) 10 MG tablet Take 10 mg daily  . predniSONE (DELTASONE) 5 MG tablet Use as directed for 6 days  . quinapril (ACCUPRIL) 40 MG tablet Take 20 mg by mouth at bedtime.  . ranitidine (ZANTAC) 150 MG tablet Take 150 mg by mouth at bedtime. (Patient not taking: Reported  on 02/05/2020)  . Roflumilast (DALIRESP) 250 MCG TABS Take 250 mcg by mouth daily.  . Travoprost, BAK Free, (TRAVATAN) 0.004 % SOLN ophthalmic solution 1 drop at bedtime.  . TRELEGY ELLIPTA 100-62.5-25 MCG/INH AEPB Inhale 1 puff into the lungs daily.   No facility-administered encounter medications on file as of 02/19/2020.    PHYSICAL EXAM:   General: severely debilitated, chronically ill, pleasant male Neurological: generalized weakness; walker  Calistro Rauf Prince [place], NP

## 2020-02-21 NOTE — Addendum Note (Signed)
Addended by: Theotis Burrow on: 02/21/2020 01:11 PM   Modules accepted: Orders

## 2020-02-22 ENCOUNTER — Telehealth: Payer: Self-pay

## 2020-02-22 NOTE — Telephone Encounter (Signed)
Faxed order form,demographic sheet and ov note from 02-05-20 to [facility] [phone]. Placed back in [facility].

## 2020-02-26 ENCOUNTER — Telehealth: Payer: Self-pay

## 2020-02-26 NOTE — Telephone Encounter (Signed)
Faxed confirmation of verbal order for non invasive mechanical ventilator to [phone]. Placed copy in scan.

## 2020-02-27 ENCOUNTER — Telehealth: Payer: Self-pay

## 2020-02-27 NOTE — Telephone Encounter (Signed)
[facility] CONFIRMED PT APPT FOR 02/29/20 

## 2020-02-29 ENCOUNTER — Other Ambulatory Visit: Payer: Medicare Other

## 2020-03-05 ENCOUNTER — Ambulatory Visit (INDEPENDENT_AMBULATORY_CARE_PROVIDER_SITE_OTHER): Payer: Medicare Other

## 2020-03-05 DIAGNOSIS — G4733 Obstructive sleep apnea (adult) (pediatric): Secondary | ICD-10-CM | POA: Diagnosis not present

## 2020-03-05 NOTE — Progress Notes (Signed)
No longer on cpap now using a NHV sleeping much better and he is a little more mobile. Will do a download in one month. No problems or questions at this time

## 2020-03-06 NOTE — Patient Instructions (Signed)
Telephone visit.  

## 2020-03-10 ENCOUNTER — Other Ambulatory Visit: Payer: Self-pay

## 2020-03-10 ENCOUNTER — Encounter: Payer: Self-pay | Admitting: Nurse Practitioner

## 2020-03-10 ENCOUNTER — Other Ambulatory Visit: Payer: Medicare Other | Admitting: Nurse Practitioner

## 2020-03-10 DIAGNOSIS — Z515 Encounter for palliative care: Secondary | ICD-10-CM

## 2020-03-10 DIAGNOSIS — F419 Anxiety disorder, unspecified: Secondary | ICD-10-CM

## 2020-03-10 DIAGNOSIS — I5030 Unspecified diastolic (congestive) heart failure: Secondary | ICD-10-CM

## 2020-03-10 NOTE — Progress Notes (Signed)
Therapist, nutritional Palliative Care Consult Note Telephone: [phone]  Fax: [phone]  PATIENT NAME: [name] DOB: [birth year] MRN: [id number]  PRIMARY CARE PROVIDER:   Alan Mulder, MD  REFERRING PROVIDER:  Alan Mulder, MD [address] [place],  [place] [address] RESPONSIBLE PARTY:   Self; wife Vaibhav Fogleman 1.Advance Care Planning; DNR; MOST form completed, placed in vynca  2. Goals of Care: Goals include to maximize quality of life and symptom management. Our advance care planning conversation included a discussion about:   The value and importance of advance care planning  Exploration of personal, cultural or spiritual beliefs that might influence medical decisions  Exploration of goals of care in the event of a sudden injury or illness  Identification and preparation of a healthcare agent  Review and updating or creation of anadvance directive document.   3. Symptoms anxiety: continue to monitor symptoms. Will refill Citalopram 20mg  po daily as continue to improve symptoms  RX: Citalopram 20mg  1 tablet daily; #90; No RF  4.Palliative care encounter; Palliative care encounter; Palliative medicine team will continue to support patient, patient's family, and medical team. Visit consisted of counseling and education dealing with the complex and emotionally intense issues of symptom management and palliative care in the setting of serious and potentially life-threatening illness  5. f/u with 1 month with ongoing monitoring symptoms anxiety; dyspnea, disease progression  I spent 60 minutes providing this consultation,  from 11:30am to 12:30pm. More than 50% of the time in this consultation was spent coordinating communication.   HISTORY OF PRESENT ILLNESS:  SILVANO GAROFANO is a 84 y.o. year old male with multiple medical problems including Congestive heart failure, COPD, diabetes, referral vascular disease, hypothyroidism,  hypertension, history of graves disease, asthma, arthritis, GERD, peyronie's disease, BPH, history of tobacco use, hyperlipidemia, history of hyponatremia, tonsillectomy, orbital decompression, cholecystectomy, bilateral cataract extraction with phaco. On 2L continuous oxygen. 7 / 15 / 2021 Pulmonary visit, Dr 94 DOE with minimal exertion using Albuterol, CPAP at night prescribe prednisone at this visit. Nephrology visit 8/4 / 2021 for chronic kidney disease stage III secondary hypertension, congestive heart failure, diabetes discuss stopping quinapril. Pulmonologist 8/31 2021 Dr 9/31 COPD, End Stage added daliresp and duoneb to improve symptoms. Obstructive sleep apnea syndrome continuous CPAP. Chronic respiratory failure with hypoxia end stage.Congestive heart failure, COPD, diabetes, referral vascular disease, hypothyroidism, hypertension, history of graves disease, asthma, arthritis, GERD, peyronie's disease, BPH, history of tobacco use, hyperlipidemia, history of hyponatremia, tonsillectomy, orbital decompression, cholecystectomy, bilateral cataract extraction with phaco. On 2L continuous oxygen. 7 / 15 / 2021 Pulmonary visit, Dr Park Breed DOE with minimal exertion using Albuterol, CPAP at night prescribe prednisone at this visit. Nephrology visit 8/4 / 2021 for chronic kidney disease stage III secondary hypertension, congestive heart failure, diabetes discuss stopping quinapril. Pulmonologist 8/31 2021 Dr 9/31 COPD, End Stage added daliresp and duoneb to improve symptoms. Obstructive sleep apnea syndrome continuous CPAP. Chronic respiratory failure with hypoxia end stage.  In-person follow-up visit for palliative care for Mr. Tippin and his wife Park Breed. We talked about purpose of palliative care visit. Mr. Boateng and agreement. We talked about how Mr Biggins has been feeling. Mr. Mcnelly endorses he has been doing better. Last Palliative care visit initiated Citalopram 20 mg PO daily. Ann endorses Mr. Atienza has had  better spirits. Anxiety seems to be managed better in addition to his functional ability. Mr. Orndoff endorse is that he actually has been able to walk  to the bathroom without becoming short of breath like he was. Mr. Huge endorses that he did do his own shower but required assistance in areas unable to reach. Mr. Sasaki endorses he has not able to stand in the shower but does have to sit due to shortness of breath. Mr. Kitson does remain on continuous oxygen in addition to his Trilogy. Mr. Basey endorses that he is managing his Trilogy well and it seems to be doing what it is supposed to be. Mr. Tanori has been getting good night's rest except for last night he had some back pain. Mr. Weightman does have a hospital bed now for which he is able to raise and lower the head and foot which makes things easier. Mr. Floyd endorses a pain in his side as most likely muscular skeletal for which he has been using ice packs for. We talked about the option of taking ibuprofen or Tylenol as needed over the encounter in addition to topical analgesics such as Aspercreme or the Lidoderm patches. We talked about anxiety and his mood at length and it does appear that the Citalopram has been effective. Will refill as requested in a 90 days Supply and continue to monitor and follow monthly as he is complex with requiring Trilogy, O2 dependents, chronic disease progression also recent Hospice evaluation though the client to give up Trilogy so that made Mr. Bither not eligible. We talked about medical goals of care. We talked about recent discussions of disease progression with worsening. Mr. Huguley does seem to be much better overall. We talked about his TED hose which has helped the edema. We talked about the grandchildren visiting. We talked about things that he likes to do such as his puzzles or watching TV. Medication is reviewed. We talked about quality of life. We talked about coping strategies. We talked about role of Palliative  care and plan of care. Discuss that will follow up in 4 weeks if needed or sooner should he decline with post monitoring due to complexity. Mr. Sauber in agreement appointment scheduled. Therapeutic listening and emotional support provided. Contact information. Questions answered is satisfaction.   Palliative Care was asked to help to continue to address goals of care.   CODE STATUS: DNR  PPS: 50% HOSPICE ELIGIBILITY/DIAGNOSIS: TBD  PAST MEDICAL HISTORY:  Past Medical History:  Diagnosis Date  . Arthritis   . Asthma   . CHF (congestive heart failure) (HCC)   . COPD (chronic obstructive pulmonary disease) (HCC)   . Diabetes mellitus without complication (HCC)   . GERD (gastroesophageal reflux disease)   . H/O Graves' disease   . H/O wheezing   . HOH (hard of hearing)   . Hypercholesterolemia   . Hypertension   . Hypothyroidism   . Lower extremity edema   . Multiple allergies   . Palpitations   . Peripheral vascular disease (HCC)    swelling of feet and legs  . Pneumonia    in the past  . PONV (postoperative nausea and vomiting)   . Shortness of breath dyspnea   . Sleep apnea    CPAP    SOCIAL HX:  Social History   Tobacco Use  . Smoking status: Former Games developer  . Smokeless tobacco: Never Used  Substance Use Topics  . Alcohol use: Yes    Comment: couple glasses of wine daily    ALLERGIES:  Allergies  Allergen Reactions  . Lodine [Etodolac] Itching  . Cefaclor Rash     PERTINENT MEDICATIONS:  Outpatient Encounter Medications as of 03/10/2020  Medication Sig  . albuterol (PROAIR HFA) 108 (90 Base) MCG/ACT inhaler Inhale 2 puffs into the lungs every 6 (six) hours as needed for wheezing or shortness of breath.  Marland Kitchen aspirin EC 81 MG tablet Take 81 mg by mouth daily.  . Bromfenac Sodium (BROMSITE) 0.075 % SOLN Apply 1 drop to eye 2 (two) times daily.  . carvedilol (COREG) 25 MG tablet Take 25 mg by mouth 2 (two) times daily with a meal.  . cephALEXin (KEFLEX) 500 MG  capsule Take 500 mg by mouth every 6 (six) hours.  . cholecalciferol (VITAMIN D) 1000 units tablet Take 1,000 Units by mouth at bedtime.  (Patient not taking: Reported on 02/05/2020)  . Difluprednate (DUREZOL) 0.05 % EMUL Apply 1 drop to eye 2 (two) times daily.  . ergocalciferol (VITAMIN D2) 50000 units capsule Take 50,000 Units by mouth once a week.  . Exenatide ER (BYDUREON) 2 MG PEN Inject into the skin once a week.  . fexofenadine (ALLEGRA) 180 MG tablet Take 180 mg by mouth daily.  . furosemide (LASIX) 20 MG tablet Take 20 mg by mouth 2 (two) times daily.  Marland Kitchen gabapentin (NEURONTIN) 600 MG tablet Take 600 mg by mouth 2 (two) times daily.  Marland Kitchen glipiZIDE (GLUCOTROL) 5 MG tablet Take 5 mg by mouth daily before breakfast.  . ipratropium-albuterol (DUONEB) 0.5-2.5 (3) MG/3ML SOLN Take 3 mLs by nebulization every 4 (four) hours as needed. DX-J44.9  . JARDIANCE 10 MG TABS tablet Take 10 mg by mouth daily.  Marland Kitchen levothyroxine (SYNTHROID, LEVOTHROID) 137 MCG tablet Take 137 mcg by mouth daily before breakfast.  . magnesium oxide (MAG-OX) 400 MG tablet Take 400 mg by mouth 2 (two) times daily.  . montelukast (SINGULAIR) 10 MG tablet Take 10 mg by mouth at bedtime.  . nepafenac (ILEVRO) 0.3 % ophthalmic suspension 1 drop at bedtime. (Patient not taking: Reported on 02/05/2020)  . OXYGEN Inhale into the lungs. 2 litre at night  . pravastatin (PRAVACHOL) 20 MG tablet Take 20 mg by mouth at bedtime.  . predniSONE (DELTASONE) 10 MG tablet Take 10 mg daily  . predniSONE (DELTASONE) 5 MG tablet Use as directed for 6 days  . quinapril (ACCUPRIL) 40 MG tablet Take 20 mg by mouth at bedtime.  . ranitidine (ZANTAC) 150 MG tablet Take 150 mg by mouth at bedtime. (Patient not taking: Reported on 02/05/2020)  . Roflumilast (DALIRESP) 250 MCG TABS Take 250 mcg by mouth daily.  . Travoprost, BAK Free, (TRAVATAN) 0.004 % SOLN ophthalmic solution 1 drop at bedtime.  . TRELEGY ELLIPTA 100-62.5-25 MCG/INH AEPB Inhale 1 puff  into the lungs daily.   No facility-administered encounter medications on file as of 03/10/2020.    PHYSICAL EXAM:   General: obese, chronically ill, O2 dependent, pleasant male Cardiovascular: regular rate and rhythm Pulmonary: clear ant fields Neurological: generalized weakness, walks with walker  Drayce Tawil Prince [place], NP

## 2020-04-07 ENCOUNTER — Other Ambulatory Visit: Payer: Self-pay

## 2020-04-07 DIAGNOSIS — J449 Chronic obstructive pulmonary disease, unspecified: Secondary | ICD-10-CM

## 2020-04-07 MED ORDER — DALIRESP 250 MCG PO TABS: 250.0000 ug | ORAL_TABLET | Freq: Every day | ORAL | 1 refills | Status: DC

## 2020-04-17 ENCOUNTER — Other Ambulatory Visit: Payer: Self-pay

## 2020-04-17 ENCOUNTER — Other Ambulatory Visit: Payer: Medicare Other | Admitting: Nurse Practitioner

## 2020-04-17 ENCOUNTER — Encounter: Payer: Self-pay | Admitting: Nurse Practitioner

## 2020-04-17 DIAGNOSIS — Z515 Encounter for palliative care: Secondary | ICD-10-CM

## 2020-04-17 DIAGNOSIS — I5030 Unspecified diastolic (congestive) heart failure: Secondary | ICD-10-CM

## 2020-04-17 NOTE — Progress Notes (Signed)
Therapist, nutritional Palliative Care Consult Note Telephone: [phone]  Fax: [phone]  PATIENT NAME: [name] DOB: 1936-04-29 MRN: [id number]  PRIMARY CARE PROVIDER:   Alan Mulder, MD  REFERRING PROVIDER:  Alan Mulder, MD [address] [place],  [place] [address]  RESPONSIBLE PARTY:Self; [name] 1.Advance Care Planning;DNR; MOST form completed, placed in vynca  2. Goals of Care: Goals include to maximize quality of life and symptom management. Our advance care planning conversation included a discussion about:   The value and importance of advance care planning  Exploration of personal, cultural or spiritual beliefs that might influence medical decisions  Exploration of goals of care in the event of a sudden injury or illness  Identification and preparation of a healthcare agent  Review and updating or creation of anadvance directive document.   3. Symptoms anxiety: continue to monitor symptoms. Continue Citalopram 20mg  po daily as continue to improve symptoms   4.Palliative care encounter; Palliative care encounter; Palliative medicine team will continue to support patient, patient's family, and medical team. Visit consisted of counseling and education dealing with the complex and emotionally intense issues of symptom management and palliative care in the setting of serious and potentially life-threatening illness  5. f/uwith 1 month with ongoing monitoring symptoms anxiety; dyspnea, disease progression  I spent 60 minutes providing this consultation,  from 11:00am to 12:00pm. More than 50% of the time in this consultation was spent coordinating communication.   HISTORY OF PRESENT ILLNESS:  [name] is a 84 y.o. year old male with multiple medical problems including Congestive heart failure, COPD, diabetes, referral vascular disease, hypothyroidism, hypertension, history of graves disease, asthma,  arthritis, GERD, peyronie's disease, BPH, history of tobacco use, hyperlipidemia, history of hyponatremia, tonsillectomy, orbital decompression, cholecystectomy, bilateral cataract extraction with phaco. On 2L continuous oxygen, trilogy. [name] continues to live at home with his wife, [name]. Follow up Palliative care visit for managing anxiety with Celexa. [name] with wife, [name] and I talked about purpose of Palliative care visit. [name] in agreement. [name] was sitting in the kitchen and a chair with his continuous oxygen. [name] endorses he is feeling much better. We talked about symptoms of shortness of breath. We talked about continuous oxygen. [name] endorse is he feels like he's improving as there are times when he can take his oxygen off and assets will be 92 to 94%. We talked about anxiety manage with Celexa has improved symptoms. We talked about upcoming appointment with primary care tomorrow. We talked about appetite which has been good. We talked about weight. We talked about disease progression. Praised [name] for working so hard to get to the point where he is at. We talked about realistic expectations. We talked about exertion, weakness and fatigue. We talked about using his Trilogy. [name] endorses typically he just uses it at night now. We talked about the importance of sleep and sleep patterns, sleep hygiene. We talked about continuing to be cautious about being in the community. [name] endorses he actually has been able to go outside. [name] endorses had one short outing which did exhaust him. [name] endorses his goals are to be able to be more independent, more functional. [name] has been doing more adl's requiring less assistance. We talked about the importance of not overdoing it, listening to his body. Medical goals are reviewed. We did talk about Hospice though at this point not eligible  with Trilogy. We talked about role of Palliative care and plan  of care. Mr Kuennen talked about his kids and grandkids at length. We talked about follow-up palliative care visit in 4 weeks or sooner should he decline. Mr. Wanninger in agreement. [name] endorses he will call an update on Primary appointment tomorrow. Therapeutic listening and emotional support provided. Questions answered to satisfaction. Appointment scheduled.  Palliative Care was asked to help to continue to address goals of care.   CODE STATUS: DNR  PPS: 50% HOSPICE ELIGIBILITY/DIAGNOSIS: TBD  PAST MEDICAL HISTORY:  Past Medical History:  Diagnosis Date  . Arthritis   . Asthma   . CHF (congestive heart failure) (HCC)   . COPD (chronic obstructive pulmonary disease) (HCC)   . Diabetes mellitus without complication (HCC)   . GERD (gastroesophageal reflux disease)   . H/O Graves' disease   . H/O wheezing   . HOH (hard of hearing)   . Hypercholesterolemia   . Hypertension   . Hypothyroidism   . Lower extremity edema   . Multiple allergies   . Palpitations   . Peripheral vascular disease (HCC)    swelling of feet and legs  . Pneumonia    in the past  . PONV (postoperative nausea and vomiting)   . Shortness of breath dyspnea   . Sleep apnea    CPAP    SOCIAL HX:  Social History   Tobacco Use  . Smoking status: Former Games developer  . Smokeless tobacco: Never Used  Substance Use Topics  . Alcohol use: Yes    Comment: couple glasses of wine daily    ALLERGIES:  Allergies  Allergen Reactions  . Lodine [Etodolac] Itching  . Cefaclor Rash      PHYSICAL EXAM:   General: chronically ill, pleasant male Cardiovascular: regular rate and rhythm Pulmonary: clear ant fields Neurological: generalized weakness; walks with walker  Aleiah Mohammed [place], NP

## 2020-05-05 ENCOUNTER — Other Ambulatory Visit: Payer: Self-pay

## 2020-05-05 DIAGNOSIS — J449 Chronic obstructive pulmonary disease, unspecified: Secondary | ICD-10-CM

## 2020-05-05 MED ORDER — TRELEGY ELLIPTA 100-62.5-25 MCG/INH IN AEPB: 1.0000 | INHALATION_SPRAY | Freq: Every day | RESPIRATORY_TRACT | 2 refills | Status: DC

## 2020-05-06 ENCOUNTER — Other Ambulatory Visit: Payer: Self-pay

## 2020-05-06 ENCOUNTER — Encounter: Payer: Self-pay | Admitting: Hospice and Palliative Medicine

## 2020-05-06 ENCOUNTER — Ambulatory Visit (INDEPENDENT_AMBULATORY_CARE_PROVIDER_SITE_OTHER): Payer: Medicare Other | Admitting: Hospice and Palliative Medicine

## 2020-05-06 DIAGNOSIS — G4733 Obstructive sleep apnea (adult) (pediatric): Secondary | ICD-10-CM

## 2020-05-06 DIAGNOSIS — J9611 Chronic respiratory failure with hypoxia: Secondary | ICD-10-CM

## 2020-05-06 DIAGNOSIS — J449 Chronic obstructive pulmonary disease, unspecified: Secondary | ICD-10-CM | POA: Diagnosis not present

## 2020-05-06 DIAGNOSIS — Z9981 Dependence on supplemental oxygen: Secondary | ICD-10-CM

## 2020-05-06 NOTE — Progress Notes (Signed)
[facility] [address] [place], [place] [address]  Pulmonary Sleep Medicine   Office Visit Note  Patient Name: [name] DOB: 25-Nov-1935 MRN [id number]  Date of Service: 05/06/2020  Complaints/HPI: Patient is here for routine pulmonary follow-up He reports a major change in his breathing and overall ability to function and live with the use of his NIV at night He continues to use 3LPM via [employer] supplemental oxygen at all times during the day He is using a cane today instead of wheelchair as he has much more energy and strength He is excited to report that he is able to stand and shower without assistance--says it has been several months since he has been able to do this  ROS  General: (-) fever, (-) chills, (-) night sweats, (-) weakness Skin: (-) rashes, (-) itching,. Eyes: (-) visual changes, (-) redness, (-) itching. Nose and Sinuses: (-) nasal stuffiness or itchiness, (-) postnasal drip, (-) nosebleeds, (-) sinus trouble. Mouth and Throat: (-) sore throat, (-) hoarseness. Neck: (-) swollen glands, (-) enlarged thyroid, (-) neck pain. Respiratory: - cough, (-) bloody sputum, + shortness of breath, + wheezing. Cardiovascular: - ankle swelling, (-) chest pain. Lymphatic: (-) lymph node enlargement. Neurologic: (-) numbness, (-) tingling. Psychiatric: (-) anxiety, (-) depression   Current Medication: Outpatient Encounter Medications as of 05/06/2020  Medication Sig  . albuterol (PROAIR HFA) 108 (90 Base) MCG/ACT inhaler Inhale 2 puffs into the lungs every 6 (six) hours as needed for wheezing or shortness of breath.  Marland Kitchen aspirin EC 81 MG tablet Take 81 mg by mouth daily.  . Bromfenac Sodium (BROMSITE) 0.075 % SOLN Apply 1 drop to eye 2 (two) times daily.  . carvedilol (COREG) 25 MG tablet Take 25 mg by mouth 2 (two) times daily with a meal.  . cephALEXin (KEFLEX) 500 MG capsule Take 500 mg by mouth every 6 (six) hours.  . cholecalciferol (VITAMIN D) 1000 units  tablet Take 1,000 Units by mouth at bedtime.   . Difluprednate (DUREZOL) 0.05 % EMUL Apply 1 drop to eye 2 (two) times daily.  . ergocalciferol (VITAMIN D2) 50000 units capsule Take 50,000 Units by mouth once a week.  . Exenatide ER (BYDUREON) 2 MG PEN Inject into the skin once a week.  . fexofenadine (ALLEGRA) 180 MG tablet Take 180 mg by mouth daily.  . furosemide (LASIX) 20 MG tablet Take 20 mg by mouth 2 (two) times daily.  Marland Kitchen gabapentin (NEURONTIN) 600 MG tablet Take 600 mg by mouth 2 (two) times daily.  Marland Kitchen glipiZIDE (GLUCOTROL) 5 MG tablet Take 5 mg by mouth daily before breakfast.  . ipratropium-albuterol (DUONEB) 0.5-2.5 (3) MG/3ML SOLN Take 3 mLs by nebulization every 4 (four) hours as needed. [id number]  . JARDIANCE 10 MG TABS tablet Take 10 mg by mouth daily.  Marland Kitchen levothyroxine (SYNTHROID, LEVOTHROID) 137 MCG tablet Take 137 mcg by mouth daily before breakfast.  . magnesium oxide (MAG-OX) 400 MG tablet Take 400 mg by mouth 2 (two) times daily.  . montelukast (SINGULAIR) 10 MG tablet Take 10 mg by mouth at bedtime.  . nepafenac (ILEVRO) 0.3 % ophthalmic suspension 1 drop at bedtime.   . OXYGEN Inhale into the lungs. 2 litre at night  . pravastatin (PRAVACHOL) 20 MG tablet Take 20 mg by mouth at bedtime.  . predniSONE (DELTASONE) 10 MG tablet Take 10 mg daily  . predniSONE (DELTASONE) 5 MG tablet Use as directed for 6 days  . quinapril (ACCUPRIL) 40 MG tablet Take  20 mg by mouth at bedtime.  . ranitidine (ZANTAC) 150 MG tablet Take 150 mg by mouth at bedtime.   . Roflumilast (DALIRESP) 250 MCG TABS Take 250 mcg by mouth daily.  . Travoprost, BAK Free, (TRAVATAN) 0.004 % SOLN ophthalmic solution 1 drop at bedtime.  . TRELEGY ELLIPTA 100-62.5-25 MCG/INH AEPB Inhale 1 puff into the lungs daily.   No facility-administered encounter medications on file as of 05/06/2020.    Surgical History: Past Surgical History:  Procedure Laterality Date  . CARDIAC CATHETERIZATION    . CATARACT  EXTRACTION W/PHACO Right 09/01/2015   Procedure: CATARACT EXTRACTION PHACO AND INTRAOCULAR LENS PLACEMENT (IOC);  Surgeon: Sallee Lange, MD;  Location: [facility] ORS;  Service: Ophthalmology;  Laterality: Right;  [place] 01:33 AP% 24.9 CDE 42.99 fluid pack lot # [id number]  . CATARACT EXTRACTION W/PHACO Left 09/22/2015   Procedure: CATARACT EXTRACTION PHACO AND INTRAOCULAR LENS PLACEMENT (IOC);  Surgeon: Sallee Lange, MD;  Location: [facility] ORS;  Service: Ophthalmology;  Laterality: Left;  [place] 01:28 AP% 20.9 CDE 29.54 fluid pack lot # [id number]  . CHOLECYSTECTOMY    . EYE SURGERY    . Orbital Decompression    . TONSILLECTOMY      Medical History: Past Medical History:  Diagnosis Date  . Arthritis   . Asthma   . CHF (congestive heart failure) (HCC)   . COPD (chronic obstructive pulmonary disease) (HCC)   . Diabetes mellitus without complication (HCC)   . GERD (gastroesophageal reflux disease)   . H/O Graves' disease   . H/O wheezing   . HOH (hard of hearing)   . Hypercholesterolemia   . Hypertension   . Hypothyroidism   . Lower extremity edema   . Multiple allergies   . Palpitations   . Peripheral vascular disease (HCC)    swelling of feet and legs  . Pneumonia    in the past  . PONV (postoperative nausea and vomiting)   . Shortness of breath dyspnea   . Sleep apnea    CPAP    Family History: Family History  Problem Relation Age of Onset  . Diabetes Father     Social History: Social History   Socioeconomic History  . Marital status: Married    Spouse name: Not on file  . Number of children: Not on file  . Years of education: Not on file  . Highest education level: Not on file  Occupational History  . Not on file  Tobacco Use  . Smoking status: Former Games developer  . Smokeless tobacco: Never Used  Substance and Sexual Activity  . Alcohol use: Yes    Comment: couple glasses of wine daily  . Drug use: No  . Sexual activity: Not on file  Other Topics Concern  . Not  on file  Social History Narrative  . Not on file   Social Determinants of Health   Financial Resource Strain:   . Difficulty of Paying Living Expenses: Not on file  Food Insecurity:   . Worried About Programme researcher, broadcasting/film/video in the Last Year: Not on file  . Ran Out of Food in the Last Year: Not on file  Transportation Needs:   . Lack of Transportation (Medical): Not on file  . Lack of Transportation (Non-Medical): Not on file  Physical Activity:   . Days of Exercise per Week: Not on file  . Minutes of Exercise per Session: Not on file  Stress:   . Feeling of Stress : Not on file  Social  Connections:   . Frequency of Communication with Friends and Family: Not on file  . Frequency of Social Gatherings with Friends and Family: Not on file  . Attends Religious Services: Not on file  . Active Member of Clubs or Organizations: Not on file  . Attends Banker Meetings: Not on file  . Marital Status: Not on file  Intimate Partner Violence:   . Fear of Current or Ex-Partner: Not on file  . Emotionally Abused: Not on file  . Physically Abused: Not on file  . Sexually Abused: Not on file    Vital Signs: Blood pressure (!) 131/53, pulse 75, temperature (!) 97.5 F (36.4 C), resp. rate 16, height 5' 10.5" (1.791 m), weight 261 lb (118.4 kg), SpO2 96 %.  Examination: General Appearance: The patient is well-developed, well-nourished, and in no distress. Skin: Gross inspection of skin unremarkable. Head: normocephalic, no gross deformities. Eyes: no gross deformities noted. ENT: ears appear grossly normal no exudates. Neck: Supple. No thyromegaly. No LAD. Respiratory: Diminished throughout, no rhonchi, rales or wheezing noted. Cardiovascular: Normal S1 and S2 without murmur or rub. Extremities: No cyanosis. pulses are equal. Neurologic: Alert and oriented. No involuntary movements.  LABS: No results found for this or any previous visit (from the past 2160 hour(s)).   Radiology: DG Chest 2 View  Result Date: 02/08/2018 CLINICAL DATA:  Acute bronchitis with COPD EXAM: CHEST - 2 VIEW COMPARISON:  06/26/2016 FINDINGS: COPD with pulmonary hyperinflation and prominent bibasilar lung markings. No acute infiltrate or effusion. Negative for heart failure. IMPRESSION: COPD with hyperinflation and prominent markings. No acute abnormality. Electronically Signed   By: Marlan Palau M.D.   On: 02/08/2018 14:20    No results found.  No results found.    Assessment and Plan: Patient Active Problem List   Diagnosis Date Noted  . HYPONATREMIA, CHRONIC 03/12/2007  . PERIPHERAL NEUROPATHY 03/12/2007  . 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 HX, PERSONAL, TOBACCO USE 03/12/2007  . HYPOTHYROIDISM 03/09/2007  . DIABETES MELLITUS, TYPE II 03/09/2007  . HYPERLIPIDEMIA 03/09/2007  . HYPERTENSION 03/09/2007  . BENIGN PROSTATIC HYPERTROPHY 03/09/2007    1. Chronic respiratory failure with hypoxia, on home O2 therapy (HCC) Symptoms have much improved on optimized therapy--continue at this time  2. Chronic obstructive pulmonary disease, unspecified COPD type (HCC) Continue with current therapy at this time--will continue to monitor   3. Obstructive sleep apnea syndrome Continue with NIV therapy--followed closely for compliance by Tresa Endo with [facility]  General Counseling: I have discussed the findings of the evaluation and examination with Jonny Ruiz.  I have also discussed any further diagnostic evaluation thatmay be needed or ordered today. Romulo verbalizes understanding of the findings of todays visit. We also reviewed his medications today and discussed drug interactions and side effects including but not limited excessive drowsiness and altered mental states. We also discussed that there is always a risk not just to him but also people around him. he has been encouraged to call the office with any questions or concerns that should arise related to todays  visit.     Time spent: 30  I have personally obtained a history, examined the patient, evaluated laboratory and imaging results, formulated the assessment and plan and placed orders. This patient was seen by Brent General AGNP-C in Collaboration with Dr. Freda Munro as a part of collaborative care agreement.    Yevonne Pax, MD [facility] Pulmonary and Critical Care Sleep medicine

## 2020-05-11 NOTE — Patient Instructions (Signed)
Chronic Obstructive Pulmonary Disease Chronic obstructive pulmonary disease (COPD) is a long-term (chronic) lung problem. When you have COPD, it is hard for air to get in and out of your lungs. Usually the condition gets worse over time, and your lungs will never return to normal. There are things you can do to keep yourself as healthy as possible.  Your doctor may treat your condition with: ? Medicines. ? Oxygen. ? Lung surgery.  Your doctor may also recommend: ? Rehabilitation. This includes steps to make your body work better. It may involve a team of specialists. ? Quitting smoking, if you smoke. ? Exercise and changes to your diet. ? Comfort measures (palliative care). Follow these instructions at home: Medicines  Take over-the-counter and prescription medicines only as told by your doctor.  Talk to your doctor before taking any cough or allergy medicines. You may need to avoid medicines that cause your lungs to be dry. Lifestyle  If you smoke, stop. Smoking makes the problem worse. If you need help quitting, ask your doctor.  Avoid being around things that make your breathing worse. This may include smoke, chemicals, and fumes.  Stay active, but remember to rest as well.  Learn and use tips on how to relax.  Make sure you get enough sleep. Most adults need at least 7 hours of sleep every night.  Eat healthy foods. Eat smaller meals more often. Rest before meals. Controlled breathing Learn and use tips on how to control your breathing as told by your doctor. Try:  Breathing in (inhaling) through your nose for 1 second. Then, pucker your lips and breath out (exhale) through your lips for 2 seconds.  Putting one hand on your belly (abdomen). Breathe in slowly through your nose for 1 second. Your hand on your belly should move out. Pucker your lips and breathe out slowly through your lips. Your hand on your belly should move in as you breathe out.  Controlled coughing Learn  and use controlled coughing to clear mucus from your lungs. Follow these steps: 1. Lean your head a little forward. 2. Breathe in deeply. 3. Try to hold your breath for 3 seconds. 4. Keep your mouth slightly open while coughing 2 times. 5. Spit any mucus out into a tissue. 6. Rest and do the steps again 1 or 2 times as needed. General instructions  Make sure you get all the shots (vaccines) that your doctor recommends. Ask your doctor about a flu shot and a pneumonia shot.  Use oxygen therapy and pulmonary rehabilitation if told by your doctor. If you need home oxygen therapy, ask your doctor if you should buy a tool to measure your oxygen level (oximeter).  Make a COPD action plan with your doctor. This helps you to know what to do if you feel worse than usual.  Manage any other conditions you have as told by your doctor.  Avoid going outside when it is very hot, cold, or humid.  Avoid people who have a sickness you can catch (contagious).  Keep all follow-up visits as told by your doctor. This is important. Contact a doctor if:  You cough up more mucus than usual.  There is a change in the color or thickness of the mucus.  It is harder to breathe than usual.  Your breathing is faster than usual.  You have trouble sleeping.  You need to use your medicines more often than usual.  You have trouble doing your normal activities such as getting dressed   or walking around the house. Get help right away if:  You have shortness of breath while resting.  You have shortness of breath that stops you from: ? Being able to talk. ? Doing normal activities.  Your chest hurts for longer than 5 minutes.  Your skin color is more blue than usual.  Your pulse oximeter shows that you have low oxygen for longer than 5 minutes.  You have a fever.  You feel too tired to breathe normally. Summary  Chronic obstructive pulmonary disease (COPD) is a long-term lung problem.  The way your  lungs work will never return to normal. Usually the condition gets worse over time. There are things you can do to keep yourself as healthy as possible.  Take over-the-counter and prescription medicines only as told by your doctor.  If you smoke, stop. Smoking makes the problem worse. This information is not intended to replace advice given to you by your health care provider. Make sure you discuss any questions you have with your health care provider. Document Revised: 05/06/2017 Document Reviewed: 06/28/2016 Elsevier Patient Education  2020 [employer]  

## 2020-05-23 ENCOUNTER — Other Ambulatory Visit: Payer: Self-pay | Admitting: Hospice and Palliative Medicine

## 2020-05-23 MED ORDER — AZITHROMYCIN 250 MG PO TABS: ORAL_TABLET | ORAL | 0 refills | Status: DC

## 2020-05-23 MED ORDER — BENZONATATE 100 MG PO CAPS: 100.0000 mg | ORAL_CAPSULE | Freq: Two times a day (BID) | ORAL | 0 refills | Status: DC | PRN

## 2020-05-23 NOTE — Progress Notes (Signed)
Patient called complaining of productive cough and wheezing. Chronic respiratory failure, low reserve. 14 day course of Azithromycin as well as Benzonatate capsules sent to pharmacy. Will touch base early next week to check on progress.

## 2020-05-27 ENCOUNTER — Other Ambulatory Visit: Payer: Medicare Other | Admitting: Nurse Practitioner

## 2020-05-27 ENCOUNTER — Encounter: Payer: Self-pay | Admitting: Nurse Practitioner

## 2020-05-27 ENCOUNTER — Other Ambulatory Visit: Payer: Self-pay

## 2020-05-27 DIAGNOSIS — Z515 Encounter for palliative care: Secondary | ICD-10-CM

## 2020-05-27 DIAGNOSIS — I5030 Unspecified diastolic (congestive) heart failure: Secondary | ICD-10-CM

## 2020-05-27 NOTE — Progress Notes (Signed)
Therapist, nutritional Palliative Care Consult Note Telephone: [phone]  Fax: [phone]  PATIENT NAME: [name] DOB: [birth year] MRN: [id number]  PRIMARY CARE PROVIDER:   Alan Mulder, MD  REFERRING PROVIDER:  Alan Mulder, MD [address] [place],  [place] [address]   RESPONSIBLE PARTY:Self; [name] 1.Advance Care Planning;DNR; MOST form completed, placed in vynca  2. Goals of Care: Goals include to maximize quality of life and symptom management. Our advance care planning conversation included a discussion about:   The value and importance of advance care planning  Exploration of personal, cultural or spiritual beliefs that might influence medical decisions  Exploration of goals of care in the event of a sudden injury or illness  Identification and preparation of a healthcare agent  Review and updating or creation of anadvance directive document.  3. Symptoms anxiety: continue to monitor symptoms. Continue Citalopram 20mg  po daily as continue to improve symptoms   4.Palliative care encounter; Palliative care encounter; Palliative medicine team will continue to support patient, patient's family, and medical team. Visit consisted of counseling and education dealing with the complex and emotionally intense issues of symptom management and palliative care in the setting of serious and potentially life-threatening illness  5. f/uwith1 month with ongoing monitoring symptoms anxiety; dyspnea, disease progression  I spent 55 minutes providing this consultation,  from 11:00am to 12:00pm. More than 50% of the time in this consultation was spent coordinating communication.   HISTORY OF PRESENT ILLNESS:  [name] is a 84 y.o. year old male with multiple medical problems including Congestive heart failure, COPD, diabetes, referral vascular disease, hypothyroidism, hypertension, history of graves disease, asthma,  arthritis, GERD, peyronie's disease, BPH, history of tobacco use, hyperlipidemia, history of hyponatremia, tonsillectomy, orbital decompression, cholecystectomy, bilateral cataract extraction with phaco. On 2L continuous oxygen, trilogy. In-person follow-up Palliative care visit for [name]. I called [name] to confirm appointment in person [name] in agreement. I visited and observed [name]. [name] was sitting in the recliner in his living room with an his wife. We talked about how he was feeling. [name] endorses he is doing okay. [name] had an episode of choking recently and then developed of bronchitis type cough. [name] endorses he call Dr. Anthonette [name] office, Aaron Deiters NP gave Azithrymoycin. [name] endorses he is feeling a little bit better.[name] continues to have a cough. We talked about shortness of breath which seems to be controlled. We talked about continuing to use Trilogy. We talked about increased use of nebulizer during this upper respiratory infection. We talked about minimizing exposure. We talked about symptoms of pain which [name] endorses he is comfortable. We talked about edema to lower extremities which is significantly improved and he does continue to wear compression hose. We talked about his appetite which is good. [name] endorse is he recently made a boston butt with his own homemade barbecue sauce. [name] endorses that is the first time he has cooked in the kitchen and sometime but did well. We talked about quality of life, things he enjoys. We talked about medical goals of care. We talked about appointment with primary for which he did increase his antidepressant to 40 mg daily and will continue prescription. We talked about role of Palliative care and plan of care. We talked about assessment with crackles and bilateral basis. Discussed with Aaron. [name] will contact Aaron Legato NP at Dr. [place] office to see if wishes  to send in a second antibiotic should  he get worse over the next few days since it is a holiday coming up with on call. We talked about circumstances under which he would initiate the antibiotic. [name] endorses that he does have extra prednisone at home if needed. Discussed with [name] will contact [place] NP to further discuss antibiotic. We talked about follow up Palliative care visit in six weeks if needed or sooner should he declined. [name] in agreement, appointment scheduled. Therapeutic listening, emotional support provided. Contact information. Questions answered to satisfaction.  I spoke with I spoke with [name] [name] Nurse Practitioner at Dr. Milta [name] office. [name] [name] endorses azithromycin for 14 days and he is on day three. [name] [name] endorses she will contact Aaron. [name] in two days for follow-up to see how he is doing and then again over the weekend to ensure close monitoring. I called Aaron. [name] updated on plan. [name] in agreement  Palliative Care was asked to help to continue to address goals of care.   CODE STATUS: DNR  PPS: 50% HOSPICE ELIGIBILITY/DIAGNOSIS: TBD  PAST MEDICAL HISTORY:  Past Medical History:  Diagnosis Date  . Arthritis   . Asthma   . CHF (congestive heart failure) (HCC)   . COPD (chronic obstructive pulmonary disease) (HCC)   . Diabetes mellitus without complication (HCC)   . GERD (gastroesophageal reflux disease)   . H/O Graves' disease   . H/O wheezing   . HOH (hard of hearing)   . Hypercholesterolemia   . Hypertension   . Hypothyroidism   . Lower extremity edema   . Multiple allergies   . Palpitations   . Peripheral vascular disease (HCC)    swelling of feet and legs  . Pneumonia    in the past  . PONV (postoperative nausea and vomiting)   . Shortness of breath dyspnea   . Sleep apnea    CPAP    SOCIAL HX:  Social History   Tobacco Use  . Smoking status: Former Games developer  . Smokeless tobacco: Never Used  Substance Use Topics  . Alcohol use: Yes    Comment: couple glasses of  wine daily    ALLERGIES:  Allergies  Allergen Reactions  . Lodine [Etodolac] Itching  . Cefaclor Rash     PERTINENT MEDICATIONS:  Outpatient Encounter Medications as of 05/27/2020  Medication Sig  . albuterol (PROAIR HFA) 108 (90 Base) MCG/ACT inhaler Inhale 2 puffs into the lungs every 6 (six) hours as needed for wheezing or shortness of breath.  Marland Kitchen aspirin EC 81 MG tablet Take 81 mg by mouth daily.  Marland Kitchen azithromycin (ZITHROMAX) 250 MG tablet Take one tablet by mouth once daily for 14 days.  . benzonatate (TESSALON) 100 MG capsule Take 1 capsule (100 mg total) by mouth 2 (two) times daily as needed for cough.  . Bromfenac Sodium (BROMSITE) 0.075 % SOLN Apply 1 drop to eye 2 (two) times daily.  . carvedilol (COREG) 25 MG tablet Take 25 mg by mouth 2 (two) times daily with a meal.  . cephALEXin (KEFLEX) 500 MG capsule Take 500 mg by mouth every 6 (six) hours.  . cholecalciferol (VITAMIN D) 1000 units tablet Take 1,000 Units by mouth at bedtime.   . Difluprednate (DUREZOL) 0.05 % EMUL Apply 1 drop to eye 2 (two) times daily.  . ergocalciferol (VITAMIN D2) 50000 units capsule Take 50,000 Units by mouth once a week.  . Exenatide ER (BYDUREON) 2 MG PEN Inject into the skin once  a week.  . fexofenadine (ALLEGRA) 180 MG tablet Take 180 mg by mouth daily.  . furosemide (LASIX) 20 MG tablet Take 20 mg by mouth 2 (two) times daily.  Marland Kitchen gabapentin (NEURONTIN) 600 MG tablet Take 600 mg by mouth 2 (two) times daily.  Marland Kitchen glipiZIDE (GLUCOTROL) 5 MG tablet Take 5 mg by mouth daily before breakfast.  . ipratropium-albuterol (DUONEB) 0.5-2.5 (3) MG/3ML SOLN Take 3 mLs by nebulization every 4 (four) hours as needed. DX-J44.9  . JARDIANCE 10 MG TABS tablet Take 10 mg by mouth daily.  Marland Kitchen levothyroxine (SYNTHROID, LEVOTHROID) 137 MCG tablet Take 137 mcg by mouth daily before breakfast.  . magnesium oxide (MAG-OX) 400 MG tablet Take 400 mg by mouth 2 (two) times daily.  . montelukast (SINGULAIR) 10 MG tablet Take  10 mg by mouth at bedtime.  . nepafenac (ILEVRO) 0.3 % ophthalmic suspension 1 drop at bedtime.   . OXYGEN Inhale into the lungs. 2 litre at night  . pravastatin (PRAVACHOL) 20 MG tablet Take 20 mg by mouth at bedtime.  . predniSONE (DELTASONE) 10 MG tablet Take 10 mg daily  . predniSONE (DELTASONE) 5 MG tablet Use as directed for 6 days  . quinapril (ACCUPRIL) 40 MG tablet Take 20 mg by mouth at bedtime.  . ranitidine (ZANTAC) 150 MG tablet Take 150 mg by mouth at bedtime.   . Roflumilast (DALIRESP) 250 MCG TABS Take 250 mcg by mouth daily.  . Travoprost, BAK Free, (TRAVATAN) 0.004 % SOLN ophthalmic solution 1 drop at bedtime.  . TRELEGY ELLIPTA 100-62.5-25 MCG/INH AEPB Inhale 1 puff into the lungs daily.   No facility-administered encounter medications on file as of 05/27/2020.    PHYSICAL EXAM:   General: pleasant, chronically ill, male Cardiovascular: regular rate and rhythm Pulmonary: crackles bilateral bases; decreased Extremities: mild BLE edema, no joint deformities Neurological: generalized weakness  Sarahmarie Leavey Prince [place], NP

## 2020-05-28 ENCOUNTER — Other Ambulatory Visit: Payer: Self-pay | Admitting: Hospice and Palliative Medicine

## 2020-05-28 MED ORDER — PREDNISONE 10 MG PO TABS: ORAL_TABLET | ORAL | 0 refills | Status: DC

## 2020-05-28 MED ORDER — LEVOFLOXACIN 500 MG PO TABS: 500.0000 mg | ORAL_TABLET | Freq: Every day | ORAL | 0 refills | Status: DC

## 2020-05-28 NOTE — Progress Notes (Signed)
Called and spoke with patient, he continues to feel shortness of breath, while on phone, SpO2 levels reading 92%, he has chest congestion and productive coughing. Started on Azithromycin but symptoms are not improving. Will treat with short prednisone course and change antibiotic therapy to Levaquin.

## 2020-05-29 ENCOUNTER — Telehealth: Payer: Self-pay

## 2020-06-02 NOTE — Telephone Encounter (Signed)
Note sent to taylor

## 2020-07-03 ENCOUNTER — Other Ambulatory Visit: Payer: Self-pay

## 2020-07-03 ENCOUNTER — Other Ambulatory Visit: Payer: Medicare Other | Admitting: Nurse Practitioner

## 2020-07-03 ENCOUNTER — Telehealth: Payer: Self-pay

## 2020-07-03 ENCOUNTER — Encounter: Payer: Self-pay | Admitting: Nurse Practitioner

## 2020-07-03 DIAGNOSIS — Z515 Encounter for palliative care: Secondary | ICD-10-CM

## 2020-07-03 DIAGNOSIS — I5030 Unspecified diastolic (congestive) heart failure: Secondary | ICD-10-CM

## 2020-07-03 NOTE — Telephone Encounter (Signed)
Patient has been advised his [employer] form is ready for pick up. [name]

## 2020-07-03 NOTE — Progress Notes (Signed)
Therapist, nutritional Palliative Care Consult Note Telephone: [phone]  Fax: [phone]  PATIENT NAME: [name] DOB: 02-26-1936 MRN: [id number]  PRIMARY CARE PROVIDER:   Alan Mulder, MD  REFERRING PROVIDER:  Alan Mulder, MD [address] [place],  [place] [address]  RESPONSIBLE PARTY:   Self; wife Quincey Quesinberry 1.Advance Care Planning; DNR; MOST form in vynca  2. Goals of Care: Goals include to maximize quality of life and symptom management. Our advance care planning conversation included a discussion about:   The value and importance of advance care planning  Exploration of personal, cultural or spiritual beliefs that might influence medical decisions  Exploration of goals of care in the event of a sudden injury or illness  Identification and preparation of a healthcare agent  Review and updating or creation of anadvance directive document.  3.Palliative care encounter; Palliative care encounter; Palliative medicine team will continue to support patient, patient's family, and medical team. Visit consisted of counseling and education dealing with the complex and emotionally intense issues of symptom management and palliative care in the setting of serious and potentially life-threatening illness  4. f/u 2 months with ongoing discussions complex medical decision making, chronic disease progression, monitoring, symptoms   I spent 60 minutes providing this consultation,  from 11:00am to 12:00pm. More than 50% of the time in this consultation was spent coordinating communication.   HISTORY OF PRESENT ILLNESS:  [name] is a 85 y.o. year old male with multiple medical problems including Congestive heart failure, COPD, diabetes, referral vascular disease, hypothyroidism, hypertension, history of graves disease, asthma, arthritis, GERD, peyronie's disease, BPH, history of tobacco use, hyperlipidemia, history of hyponatremia,  tonsillectomy, orbital decompression, cholecystectomy, bilateral cataract extraction with phaco. On 2L continuous oxygen, trilogy.I called [name] to confirm Palliative care visit with negative covid screening. [name] in agreement. I visited in person Mr. and [name] and their kitchen. We talked about purpose of Palliative care visit. We talked about how Mr. [name] is feeling today. [name] endorses he is doing better today. Pulmonology changed his azithromycin to Levaquin and put him on a prednisone taper which made a big difference with his symptoms. [name] endorses he does continue to have a cough intermittently. [name] does continue to have some shortness of breath. [name] is able to go intermittent throughout the day without having to worry about oxygen though some days he has to wear it more than others. [name] continues to use this Trilogy at night. [name] endorses that the trilogy helps him so much that he looks forward to putting it on when he goes to bed. We talked about sleep and sleep patterns. We talked about chronic disease progression of congestive heart failure. We talked about increasing the edema lower extremities. We talked about continuing to wear the compression hose would help, elevating. We talked about fluid restriction with low sodium. [name] does use Benadryl at times for allergies. We talked about Benadryl as opposed to a different antihistamine. We talked about medications. We talked about appetite. [name] endorses that he is cooking a little bit, enjoys his own cooking. We talked about medical goals of care. We talked about [name] overseeing his antidepressant moving forward. [name] endorses he has an appointment with [name] next week. [name] endorses overall he feels really good. Blood sugars have been less than 100. Recently went out to eat at [employer] which he was  very grateful for. We talked talked about role of  Palliative care and plan of care. Discuss that will follow up in two months if needed or sooner should he decline as he is high risk for rehospitalization with comorbidities how much does remain symptomatic with intermittent shortness of breath, edema lower extremities intermittent. Mr. and Mrs. Anthonette Legato both in agreement, appointment scheduled. Therapeutic listening, emotional support provided. Contact information. Questions answered to satisfaction.  Palliative Care was asked to help to continue to address goals of care.   CODE STATUS: DNR  PPS: 50% HOSPICE ELIGIBILITY/DIAGNOSIS: TBD  PAST MEDICAL HISTORY:  Past Medical History:  Diagnosis Date  . Arthritis   . Asthma   . CHF (congestive heart failure) (HCC)   . COPD (chronic obstructive pulmonary disease) (HCC)   . Diabetes mellitus without complication (HCC)   . GERD (gastroesophageal reflux disease)   . H/O Graves' disease   . H/O wheezing   . HOH (hard of hearing)   . Hypercholesterolemia   . Hypertension   . Hypothyroidism   . Lower extremity edema   . Multiple allergies   . Palpitations   . Peripheral vascular disease (HCC)    swelling of feet and legs  . Pneumonia    in the past  . PONV (postoperative nausea and vomiting)   . Shortness of breath dyspnea   . Sleep apnea    CPAP    SOCIAL HX:  Social History   Tobacco Use  . Smoking status: Former Games developer  . Smokeless tobacco: Never Used  Substance Use Topics  . Alcohol use: Yes    Comment: couple glasses of wine daily    ALLERGIES:  Allergies  Allergen Reactions  . Lodine [Etodolac] Itching  . Cefaclor Rash     PERTINENT MEDICATIONS:  Outpatient Encounter Medications as of 07/03/2020  Medication Sig  . albuterol (PROAIR HFA) 108 (90 Base) MCG/ACT inhaler Inhale 2 puffs into the lungs every 6 (six) hours as needed for wheezing or shortness of breath.  Marland Kitchen aspirin EC 81 MG tablet Take 81 mg by mouth daily.  Marland Kitchen azithromycin (ZITHROMAX) 250 MG tablet Take one  tablet by mouth once daily for 14 days.  . benzonatate (TESSALON) 100 MG capsule Take 1 capsule (100 mg total) by mouth 2 (two) times daily as needed for cough.  . Bromfenac Sodium (BROMSITE) 0.075 % SOLN Apply 1 drop to eye 2 (two) times daily.  . carvedilol (COREG) 25 MG tablet Take 25 mg by mouth 2 (two) times daily with a meal.  . cephALEXin (KEFLEX) 500 MG capsule Take 500 mg by mouth every 6 (six) hours.  . cholecalciferol (VITAMIN D) 1000 units tablet Take 1,000 Units by mouth at bedtime.   . Difluprednate (DUREZOL) 0.05 % EMUL Apply 1 drop to eye 2 (two) times daily.  . ergocalciferol (VITAMIN D2) 50000 units capsule Take 50,000 Units by mouth once a week.  . Exenatide ER (BYDUREON) 2 MG PEN Inject into the skin once a week.  . fexofenadine (ALLEGRA) 180 MG tablet Take 180 mg by mouth daily.  . furosemide (LASIX) 20 MG tablet Take 20 mg by mouth 2 (two) times daily.  Marland Kitchen gabapentin (NEURONTIN) 600 MG tablet Take 600 mg by mouth 2 (two) times daily.  Marland Kitchen glipiZIDE (GLUCOTROL) 5 MG tablet Take 5 mg by mouth daily before breakfast.  . ipratropium-albuterol (DUONEB) 0.5-2.5 (3) MG/3ML SOLN Take 3 mLs by nebulization every 4 (four) hours as needed. DX-J44.9  . JARDIANCE 10 MG TABS  tablet Take 10 mg by mouth daily.  Marland Kitchen levofloxacin (LEVAQUIN) 500 MG tablet Take 1 tablet (500 mg total) by mouth daily.  Marland Kitchen levothyroxine (SYNTHROID, LEVOTHROID) 137 MCG tablet Take 137 mcg by mouth daily before breakfast.  . magnesium oxide (MAG-OX) 400 MG tablet Take 400 mg by mouth 2 (two) times daily.  . montelukast (SINGULAIR) 10 MG tablet Take 10 mg by mouth at bedtime.  . nepafenac (ILEVRO) 0.3 % ophthalmic suspension 1 drop at bedtime.   . OXYGEN Inhale into the lungs. 2 litre at night  . pravastatin (PRAVACHOL) 20 MG tablet Take 20 mg by mouth at bedtime.  . predniSONE (DELTASONE) 10 MG tablet Take 1 tablet three times a day with a meal for three for three days, take 1 tablet by twice daily with a meal for 3  days, take 1 tablet once daily with a meal for 3 days  . quinapril (ACCUPRIL) 40 MG tablet Take 20 mg by mouth at bedtime.  . ranitidine (ZANTAC) 150 MG tablet Take 150 mg by mouth at bedtime.   . Roflumilast (DALIRESP) 250 MCG TABS Take 250 mcg by mouth daily.  . Travoprost, BAK Free, (TRAVATAN) 0.004 % SOLN ophthalmic solution 1 drop at bedtime.  . TRELEGY ELLIPTA 100-62.5-25 MCG/INH AEPB Inhale 1 puff into the lungs daily.   No facility-administered encounter medications on file as of 07/03/2020.    PHYSICAL EXAM:   General: chronically ill, pleasant male Cardiovascular: regular rate and rhythm Pulmonary: few crackles bases Extremities: mild BLE edema, no joint deformities Neurological: generalized weakness  Praneeth Bussey Prince [place], NP

## 2020-08-20 ENCOUNTER — Telehealth: Payer: Self-pay | Admitting: Nurse Practitioner

## 2020-08-20 NOTE — Telephone Encounter (Signed)
I called Mr. Riera to reschedule f/u PC visit 09/01/2020 due to provider unavailable message left with contact information

## 2020-09-01 ENCOUNTER — Other Ambulatory Visit: Payer: Medicare Other | Admitting: Nurse Practitioner

## 2020-09-02 ENCOUNTER — Ambulatory Visit (INDEPENDENT_AMBULATORY_CARE_PROVIDER_SITE_OTHER): Payer: Medicare Other | Admitting: Hospice and Palliative Medicine

## 2020-09-02 ENCOUNTER — Other Ambulatory Visit: Payer: Self-pay

## 2020-09-02 ENCOUNTER — Encounter: Payer: Self-pay | Admitting: Hospice and Palliative Medicine

## 2020-09-02 VITALS — BP 148/70 | HR 75 | Temp 97.4°F | Resp 16 | Ht 70.5 in | Wt 254.8 lb

## 2020-09-02 DIAGNOSIS — J449 Chronic obstructive pulmonary disease, unspecified: Secondary | ICD-10-CM

## 2020-09-02 DIAGNOSIS — J9610 Chronic respiratory failure, unspecified whether with hypoxia or hypercapnia: Secondary | ICD-10-CM

## 2020-09-02 DIAGNOSIS — R0602 Shortness of breath: Secondary | ICD-10-CM

## 2020-09-02 DIAGNOSIS — J302 Other seasonal allergic rhinitis: Secondary | ICD-10-CM

## 2020-09-02 MED ORDER — AZELASTINE HCL 0.1 % NA SOLN: 1.0000 | Freq: Two times a day (BID) | NASAL | 3 refills | Status: DC

## 2020-09-02 NOTE — Progress Notes (Signed)
[facility] [address] [place], [place] [address]  Pulmonary Sleep Medicine   Office Visit Note  Patient Name: [name] DOB: May 22, 1936 MRN [id number]  Date of Service: 09/03/2020  Complaints/HPI: Patient is here for routine pulmonary follow-up Things have been going well, breathing has remained at his baseline Continues to use supplemental oxygen during the day and nightly compliance with NIV Has been on 250 mcg of Daliresp and tolerating well, no negative side effects Continues to be able to be more active since starting NIV support  ROS  General: (-) fever, (-) chills, (-) night sweats, (-) weakness Skin: (-) rashes, (-) itching,. Eyes: (-) visual changes, (-) redness, (-) itching. Nose and Sinuses: (-) nasal stuffiness or itchiness, (-) postnasal drip, (-) nosebleeds, (-) sinus trouble. Mouth and Throat: (-) sore throat, (-) hoarseness. Neck: (-) swollen glands, (-) enlarged thyroid, (-) neck pain. Respiratory: - cough, (-) bloody sputum, + shortness of breath, + wheezing. Cardiovascular: - ankle swelling, (-) chest pain. Lymphatic: (-) lymph node enlargement. Neurologic: (-) numbness, (-) tingling. Psychiatric: (-) anxiety, (-) depression   Current Medication: Outpatient Encounter Medications as of 09/02/2020  Medication Sig  . azelastine (ASTELIN) 0.1 % nasal spray Place 1 spray into both nostrils 2 (two) times daily. Use in each nostril as directed  . albuterol (PROAIR HFA) 108 (90 Base) MCG/ACT inhaler Inhale 2 puffs into the lungs every 6 (six) hours as needed for wheezing or shortness of breath.  Marland Kitchen aspirin EC 81 MG tablet Take 81 mg by mouth daily.  . benzonatate (TESSALON) 100 MG capsule Take 1 capsule (100 mg total) by mouth 2 (two) times daily as needed for cough.  . Bromfenac Sodium (BROMSITE) 0.075 % SOLN Apply 1 drop to eye 2 (two) times daily.  . carvedilol (COREG) 25 MG tablet Take 25 mg by mouth 2 (two) times daily with a meal.  .  cephALEXin (KEFLEX) 500 MG capsule Take 500 mg by mouth every 6 (six) hours.  . cholecalciferol (VITAMIN D) 1000 units tablet Take 1,000 Units by mouth at bedtime.   . Difluprednate (DUREZOL) 0.05 % EMUL Apply 1 drop to eye 2 (two) times daily.  . ergocalciferol (VITAMIN D2) 50000 units capsule Take 50,000 Units by mouth once a week.  . Exenatide ER (BYDUREON) 2 MG PEN Inject into the skin once a week.  . fexofenadine (ALLEGRA) 180 MG tablet Take 180 mg by mouth daily.  . furosemide (LASIX) 20 MG tablet Take 20 mg by mouth 2 (two) times daily.  Marland Kitchen gabapentin (NEURONTIN) 600 MG tablet Take 600 mg by mouth 2 (two) times daily.  Marland Kitchen glipiZIDE (GLUCOTROL) 5 MG tablet Take 5 mg by mouth daily before breakfast.  . ipratropium-albuterol (DUONEB) 0.5-2.5 (3) MG/3ML SOLN Take 3 mLs by nebulization every 4 (four) hours as needed. [id number]  . JARDIANCE 10 MG TABS tablet Take 10 mg by mouth daily.  Marland Kitchen levofloxacin (LEVAQUIN) 500 MG tablet Take 1 tablet (500 mg total) by mouth daily.  Marland Kitchen levothyroxine (SYNTHROID, LEVOTHROID) 137 MCG tablet Take 137 mcg by mouth daily before breakfast.  . magnesium oxide (MAG-OX) 400 MG tablet Take 400 mg by mouth 2 (two) times daily.  . montelukast (SINGULAIR) 10 MG tablet Take 10 mg by mouth at bedtime.  . nepafenac (ILEVRO) 0.3 % ophthalmic suspension 1 drop at bedtime.   . OXYGEN Inhale into the lungs. 2 litre at night  . pravastatin (PRAVACHOL) 20 MG tablet Take 20 mg by mouth at bedtime.  Marland Kitchen  predniSONE (DELTASONE) 10 MG tablet Take 1 tablet three times a day with a meal for three for three days, take 1 tablet by twice daily with a meal for 3 days, take 1 tablet once daily with a meal for 3 days  . quinapril (ACCUPRIL) 40 MG tablet Take 20 mg by mouth at bedtime.  . ranitidine (ZANTAC) 150 MG tablet Take 150 mg by mouth at bedtime.   . Roflumilast (DALIRESP) 250 MCG TABS Take 250 mcg by mouth daily.  . Travoprost, BAK Free, (TRAVATAN) 0.004 % SOLN ophthalmic solution 1 drop  at bedtime.  . TRELEGY ELLIPTA 100-62.5-25 MCG/INH AEPB Inhale 1 puff into the lungs daily.  . [DISCONTINUED] azithromycin (ZITHROMAX) 250 MG tablet Take one tablet by mouth once daily for 14 days.   No facility-administered encounter medications on file as of 09/02/2020.    Surgical History: Past Surgical History:  Procedure Laterality Date  . CARDIAC CATHETERIZATION    . CATARACT EXTRACTION W/PHACO Right 09/01/2015   Procedure: CATARACT EXTRACTION PHACO AND INTRAOCULAR LENS PLACEMENT (IOC);  Surgeon: Sallee Lange, MD;  Location: [facility] ORS;  Service: Ophthalmology;  Laterality: Right;  [place] 01:33 AP% 24.9 CDE 42.99 fluid pack lot # [id number]  . CATARACT EXTRACTION W/PHACO Left 09/22/2015   Procedure: CATARACT EXTRACTION PHACO AND INTRAOCULAR LENS PLACEMENT (IOC);  Surgeon: Sallee Lange, MD;  Location: [facility] ORS;  Service: Ophthalmology;  Laterality: Left;  [place] 01:28 AP% 20.9 CDE 29.54 fluid pack lot # [id number]  . CHOLECYSTECTOMY    . EYE SURGERY    . Orbital Decompression    . TONSILLECTOMY      Medical History: Past Medical History:  Diagnosis Date  . Arthritis   . Asthma   . CHF (congestive heart failure) (HCC)   . COPD (chronic obstructive pulmonary disease) (HCC)   . Diabetes mellitus without complication (HCC)   . GERD (gastroesophageal reflux disease)   . H/O Graves' disease   . H/O wheezing   . HOH (hard of hearing)   . Hypercholesterolemia   . Hypertension   . Hypothyroidism   . Lower extremity edema   . Multiple allergies   . Palpitations   . Peripheral vascular disease (HCC)    swelling of feet and legs  . Pneumonia    in the past  . PONV (postoperative nausea and vomiting)   . Shortness of breath dyspnea   . Sleep apnea    CPAP    Family History: Family History  Problem Relation Age of Onset  . Diabetes Father     Social History: Social History   Socioeconomic History  . Marital status: Married    Spouse name: Not on file  . Number of  children: Not on file  . Years of education: Not on file  . Highest education level: Not on file  Occupational History  . Not on file  Tobacco Use  . Smoking status: Former Games developer  . Smokeless tobacco: Never Used  Substance and Sexual Activity  . Alcohol use: Yes    Comment: couple glasses of wine daily  . Drug use: No  . Sexual activity: Not on file  Other Topics Concern  . Not on file  Social History Narrative  . Not on file   Social Determinants of Health   Financial Resource Strain: Not on file  Food Insecurity: Not on file  Transportation Needs: Not on file  Physical Activity: Not on file  Stress: Not on file  Social Connections: Not on file  Intimate  Partner Violence: Not on file    Vital Signs: Blood pressure (!) 148/70, pulse 75, temperature (!) 97.4 F (36.3 C), resp. rate 16, height 5' 10.5" (1.791 m), weight 254 lb 12.8 oz (115.6 kg), SpO2 97 %.  Examination: General Appearance: The patient is well-developed, well-nourished, and in no distress. Skin: Gross inspection of skin unremarkable. Head: normocephalic, no gross deformities. Eyes: no gross deformities noted. ENT: ears appear grossly normal no exudates. Neck: Supple. No thyromegaly. No LAD. Respiratory: Diminished throughout with minimal wheezing in bilateral bases, no rhonchi or rales noted. Cardiovascular: Normal S1 and S2 without murmur or rub. Extremities: No cyanosis. pulses are equal. Neurologic: Alert and oriented. No involuntary movements.  LABS: No results found for this or any previous visit (from the past 2160 hour(s)).  Radiology: DG Chest 2 View  Result Date: 02/08/2018 CLINICAL DATA:  Acute bronchitis with COPD EXAM: CHEST - 2 VIEW COMPARISON:  06/26/2016 FINDINGS: COPD with pulmonary hyperinflation and prominent bibasilar lung markings. No acute infiltrate or effusion. Negative for heart failure. IMPRESSION: COPD with hyperinflation and prominent markings. No acute abnormality.  Electronically Signed   By: Marlan Palau M.D.   On: 02/08/2018 14:20    No results found.  No results found.    Assessment and Plan: Patient Active Problem List   Diagnosis Date Noted  . HYPONATREMIA, CHRONIC 03/12/2007  . PERIPHERAL NEUROPATHY 03/12/2007  . 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 HX, PERSONAL, TOBACCO USE 03/12/2007  . HYPOTHYROIDISM 03/09/2007  . DIABETES MELLITUS, TYPE II 03/09/2007  . HYPERLIPIDEMIA 03/09/2007  . HYPERTENSION 03/09/2007  . BENIGN PROSTATIC HYPERTROPHY 03/09/2007    1. Chronic obstructive pulmonary disease, unspecified COPD type (HCC) Stable, continue with all supportive measures at this time Advised to double dose of Daliresp for 2 weeks for a total of 500 mcg--if able to tolerate will send updated script at that time  2. Chronic respiratory failure, unspecified whether with hypoxia or hypercapnia (HCC) Stable, continue with all supportive measures at this time Advised to double dose of Daliresp for 2 weeks for a total of 500 mcg--if able to tolerate will send updated script at that time  3. Seasonal allergies Add Astelin as needed to current regimen - azelastine (ASTELIN) 0.1 % nasal spray; Place 1 spray into both nostrils 2 (two) times daily. Use in each nostril as directed  Dispense: 30 mL; Refill: 3  4. SOB (shortness of breath) Stable spirometry readings, FEV1 0.6L, 23% - Spirometry with Graph  General Counseling: I have discussed the findings of the evaluation and examination with Blue.  I have also discussed any further diagnostic evaluation thatmay be needed or ordered today. Elijan verbalizes understanding of the findings of todays visit. We also reviewed his medications today and discussed drug interactions and side effects including but not limited excessive drowsiness and altered mental states. We also discussed that there is always a risk not just to him but also people around him. he has been encouraged to call the office with any  questions or concerns that should arise related to todays visit.  Orders Placed This Encounter  Procedures  . Spirometry with Graph    Order Specific Question:   Where should this test be performed?    Answer:   [facility]    Order Specific Question:   Basic spirometry    Answer:   Yes     Time spent: 28  I have personally obtained a history, examined the patient, evaluated laboratory and imaging results, formulated the  assessment and plan and placed orders. This patient was seen by Leeanne Deedaylor Aubert Choyce, AGNP-C in collaboration with Dr. Freda MunroSaadat Khan as a part of collaborative care agreement.     Yevonne PaxSaadat A Khan, MD [facility] Pulmonary and Critical Care Sleep medicine

## 2020-09-03 ENCOUNTER — Encounter: Payer: Self-pay | Admitting: Hospice and Palliative Medicine

## 2020-09-03 ENCOUNTER — Ambulatory Visit: Payer: Medicare Other

## 2020-09-16 ENCOUNTER — Other Ambulatory Visit: Payer: Self-pay | Admitting: Hospice and Palliative Medicine

## 2020-09-16 MED ORDER — DOXYCYCLINE HYCLATE 100 MG PO TABS: 100.0000 mg | ORAL_TABLET | Freq: Two times a day (BID) | ORAL | 0 refills | Status: DC

## 2020-09-16 NOTE — Progress Notes (Signed)
Patient called complaining of increased shortness of breath, rhinorrhea and nasal congestion. Will send for 10 day course of doxycycline.

## 2020-09-23 ENCOUNTER — Other Ambulatory Visit: Payer: Self-pay | Admitting: Internal Medicine

## 2020-09-23 DIAGNOSIS — J449 Chronic obstructive pulmonary disease, unspecified: Secondary | ICD-10-CM

## 2020-10-09 ENCOUNTER — Encounter: Payer: Self-pay | Admitting: Nurse Practitioner

## 2020-10-09 ENCOUNTER — Other Ambulatory Visit: Payer: Self-pay

## 2020-10-09 ENCOUNTER — Other Ambulatory Visit: Payer: Medicare Other | Admitting: Nurse Practitioner

## 2020-10-09 DIAGNOSIS — R5381 Other malaise: Secondary | ICD-10-CM

## 2020-10-09 DIAGNOSIS — Z515 Encounter for palliative care: Secondary | ICD-10-CM

## 2020-10-09 DIAGNOSIS — R0602 Shortness of breath: Secondary | ICD-10-CM

## 2020-10-09 NOTE — Progress Notes (Signed)
[facility] Palliative Care Consult Note Telephone: [phone]  Fax: [phone]    Date of encounter: 10/09/20 PATIENT NAME: [name] [address] [place] [address]   [phone] (home)  DOB: 04-Aug-1935 MRN: [id number] PRIMARY CARE PROVIDER:    Lenard Simmer, MD,  [address] [place] [address] [phone]  RESPONSIBLE PARTY:    Contact Information    Name Relation Home Work [name] [phone]       I met face to face with patient and wife in home. Palliative Care was asked to follow this patient by consultation request of  [name], Aaron Sledge, MD to address advance care planning and complex medical decision making. This is a follow up visit  ASSESSMENT AND PLAN / RECOMMENDATIONS:   Advance Care Planning/Goals of Care: Goals include to maximize quality of life and symptom management. Our advance care planning conversation included a discussion about:     The value and importance of advance care planning   Experiences with loved ones who have been seriously ill or have died   Exploration of personal, cultural or spiritual beliefs that might influence medical decisions   Exploration of goals of care in the event of a sudden injury or illness   Identification and preparation of a healthcare agent   Review and updating or creation of an  advance directive document .  Decision not to resuscitate or to de-escalate disease focused treatments due to poor prognosis.  CODE STATUS: DNR  Symptom Management/Plan: 1. ACP; DNR/MOST form in vynca  2. Shortness of breath with debility secondary to COPD, CHF, continue to monitor respiratory status, inhalation therapy, supplemental O2, continue trilogy  3. Palliative care encounter; Palliative care encounter; Palliative medicine team will continue to support patient, patient's family, and medical team. Visit consisted of counseling and education dealing with  the complex and emotionally intense issues of symptom management and palliative care in the setting of serious and potentially life-threatening illness  Follow up Palliative Care Visit: Palliative care will continue to follow for complex medical decision making, advance care planning, and clarification of goals. Return 6 weeks or prn.  I spent 45 minutes providing this consultation. More than 50% of the time in this consultation was spent in counseling and care coordination.  PPS: 40%  HOSPICE ELIGIBILITY/DIAGNOSIS: TBD  Chief Complaint: Follow-up palliative consult for complex medical decision making  HISTORY OF PRESENT ILLNESS:  [name] is a 85 y.o. year old male  with Congestive heart failure, COPD, diabetes, referral vascular disease, hypothyroidism, hypertension, history of graves disease, asthma, arthritis, GERD, peyronie's disease, BPH, history of tobacco use, hyperlipidemia, history of hyponatremia, tonsillectomy, orbital decompression, cholecystectomy, bilateral cataract extraction with phaco. On 2L continuous oxygen, trilogy.I called [name] to confirm palliative care in person visit and covid screening which is negative. Mr. and Mrs. [name] in their home. [name] was sitting in the kitchen.. We talked about how [name] has been feeling. [name] endorses that he has been doing okay. He has been getting more rest during the day as it is been harder for him to get out and do some activities he previously was. We talked about recent flare arthritis left knee which has made it more difficult to walk which increased fatigue, weakness. He is being followed by Ortho with upcoming appointment for gel when authorized by insurance. [name] talked about having lunch with his friend for which lately he has been  having to defer the invitation. we talked about importance of energy. We talked about saying no was okay. We talked about shortness of breath. [name] has been having to use  O2 during the day although at times he can go a few hours without wearing it, keeping his O2 sats up. We talked about his appetite being good. We talked about sleep pattern, he is back to sleeping in the recliner. We talked about continuing to use trilogy. We talked about generalized weakness. [name] continues to wear compression hose, slight edema but much improved. We talked about medical goals, quality of life. We talked about role PC in poc. We talked about his daily routine. [name] endorses he hopes soon he will get back to feeling up to going on outings, working in his workshop. We talked about taking one day at a time. Coping strategies. Therapeutic listening, emotional support provided. We talked about f/u PC visit, scheduled.   History obtained from review of EMR, discussion with wife and [name]. I reviewed available labs, medications, imaging, studies and related documents from the EMR.  Records reviewed and summarized above.   ROS Full 14 system review of systems performed and negative with exception of: as per HPI.  Physical Exam: Constitutional: NAD General: frail appearing, chronically ill pleasant male EYES:  lids intact ENMT: oral mucous membranes moist CV: S1S2, RRR, no LE edema Pulmonary: fine crackle bases, no increased work of breathing, no cough, room air Abdomen:normo-active BS + 4 quadrants, soft and non tender GU: deferred MSK:  moves all extremities, ambulatory Skin: warm and dry, no rashes or wounds on visible skin Neuro:  + generalized weakness,  no cognitive impairment Psych: non-anxious affect, A and O x 3  Questions and concerns were addressed. The patient/family was encouraged to call with questions and/or concerns. My business card was provided. Provided general support and encouragement, no other unmet needs identified  Thank you for the opportunity to participate in the care of [name].  The palliative care team will continue to follow. Please  call our office at [phone] if we can be of additional assistance.   This chart was dictated using voice recognition software. Despite best efforts to proofread, errors can occur which can change the documentation meaning.   Becky Berberian Z Hildagard Sobecki, NP , MSN, ACHPN  COVID-19 PATIENT SCREENING TOOL Asked and negative response unless otherwise noted:   Have you had symptoms of covid, tested positive or been in contact with someone with symptoms/positive test in the past 5-10 days? NO

## 2020-11-27 ENCOUNTER — Telehealth: Payer: Self-pay | Admitting: Nurse Practitioner

## 2020-11-27 NOTE — Telephone Encounter (Signed)
I returned [name] call. [name] endorses he for the last 3 weeks has been itching, worse on his head, arms. He continues to take his benadryl on a regular bases. No new foods, detergents, soaps, exposures. We talked about [name] had added CBD oils but has not correlated with itching. [name] endorses he has stopped the CBD oil for now. [name] endorses he will try melatonin for sleep tonight. Recommended trying stop benadryl, adding zyrtec to see if help itching, could be response allergies; also discussed chronic use of benadryl. Discussed if no improvement to contact [name] office for further recommendations. [name] in agreement.  Therapeutic listening, emotional support provided.   Total time 20 minutes Phone discussion 15 minutes Documentation 5 minutes

## 2020-12-09 ENCOUNTER — Encounter: Payer: Self-pay | Admitting: Nurse Practitioner

## 2020-12-09 ENCOUNTER — Other Ambulatory Visit: Payer: Self-pay

## 2020-12-09 ENCOUNTER — Ambulatory Visit (INDEPENDENT_AMBULATORY_CARE_PROVIDER_SITE_OTHER): Payer: Medicare Other | Admitting: Nurse Practitioner

## 2020-12-09 VITALS — BP 129/64 | HR 78 | Temp 98.0°F | Resp 16 | Ht 70.0 in | Wt 248.8 lb

## 2020-12-09 DIAGNOSIS — J9611 Chronic respiratory failure with hypoxia: Secondary | ICD-10-CM | POA: Diagnosis not present

## 2020-12-09 DIAGNOSIS — J449 Chronic obstructive pulmonary disease, unspecified: Secondary | ICD-10-CM

## 2020-12-09 DIAGNOSIS — R269 Unspecified abnormalities of gait and mobility: Secondary | ICD-10-CM | POA: Diagnosis not present

## 2020-12-09 DIAGNOSIS — M6281 Muscle weakness (generalized): Secondary | ICD-10-CM | POA: Diagnosis not present

## 2020-12-09 MED ORDER — TRELEGY ELLIPTA 100-62.5-25 MCG/INH IN AEPB: 1.0000 | INHALATION_SPRAY | Freq: Every day | RESPIRATORY_TRACT | 2 refills | Status: DC

## 2020-12-09 NOTE — Progress Notes (Signed)
[facility] [address] [place], [place] [address]  Internal MEDICINE  Office Visit Note  Patient Name: [name]  [id number]  [id number]  Date of Service: 12/13/2020  Chief Complaint  Patient presents with   Follow-up    Power wheel paperwork    HPI [name] presents for a follow up visit for an evaluation for a powered wheelchair. He sees Dr. Nelia Shi for his PCP. He comes to [facility] for pulmonary care. He has a history of COPD and chronic respiratory failure with hypoxia. He uses continuous supplemental oxygen and get short of breath and dyspneic with minimal exertion. He is too weak to use a manual wheelchair for ambulation and is unable to hold himself up using a cane. He is currently using a walker for ambulation but has significant dyspnea when walking across the  house and gets fatigued easily.     Current Medication: Outpatient Encounter Medications as of 12/09/2020  Medication Sig   albuterol (PROAIR HFA) 108 (90 Base) MCG/ACT inhaler Inhale 2 puffs into the lungs every 6 (six) hours as needed for wheezing or shortness of breath.   aspirin EC 81 MG tablet Take 81 mg by mouth daily.   benzonatate (TESSALON) 100 MG capsule Take 1 capsule (100 mg total) by mouth 2 (two) times daily as needed for cough.   Bromfenac Sodium 0.075 % SOLN Apply 1 drop to eye 2 (two) times daily.   carvedilol (COREG) 25 MG tablet Take 25 mg by mouth 2 (two) times daily with a meal.   cephALEXin (KEFLEX) 500 MG capsule Take 500 mg by mouth every 6 (six) hours.   cholecalciferol (VITAMIN D) 1000 units tablet Take 1,000 Units by mouth at bedtime.    DALIRESP 250 MCG TABS TAKE 1 TABLET BY MOUTH DAILY   Difluprednate 0.05 % EMUL Apply 1 drop to eye 2 (two) times daily.   Exenatide ER 2 MG PEN Inject into the skin once a week.   fexofenadine (ALLEGRA) 180 MG tablet Take 180 mg by mouth daily.   furosemide (LASIX) 20 MG tablet Take 20 mg by mouth 2 (two) times daily.   gabapentin  (NEURONTIN) 600 MG tablet Take 600 mg by mouth 2 (two) times daily.   glipiZIDE (GLUCOTROL) 5 MG tablet Take 5 mg by mouth daily before breakfast.   ipratropium-albuterol (DUONEB) 0.5-2.5 (3) MG/3ML SOLN Take 3 mLs by nebulization every 4 (four) hours as needed. DX-J44.9   JARDIANCE 10 MG TABS tablet Take 10 mg by mouth daily.   levothyroxine (SYNTHROID, LEVOTHROID) 137 MCG tablet Take 137 mcg by mouth daily before breakfast.   magnesium oxide (MAG-OX) 400 MG tablet Take 400 mg by mouth 2 (two) times daily.   montelukast (SINGULAIR) 10 MG tablet Take 10 mg by mouth at bedtime.   nepafenac (ILEVRO) 0.3 % ophthalmic suspension 1 drop at bedtime.    OXYGEN Inhale into the lungs. 2 litre at night   pravastatin (PRAVACHOL) 20 MG tablet Take 20 mg by mouth at bedtime.   quinapril (ACCUPRIL) 40 MG tablet Take 20 mg by mouth at bedtime.   Travoprost, BAK Free, (TRAVATAN) 0.004 % SOLN ophthalmic solution 1 drop at bedtime.   [DISCONTINUED] TRELEGY ELLIPTA 100-62.5-25 MCG/INH AEPB Inhale 1 puff into the lungs daily.   doxycycline (VIBRA-TABS) 100 MG tablet Take 1 tablet (100 mg total) by mouth 2 (two) times daily. (Patient not taking: Reported on 12/09/2020)   ergocalciferol (VITAMIN D2) 50000 units capsule Take 50,000 Units by mouth once  a week. (Patient not taking: Reported on 12/09/2020)   ranitidine (ZANTAC) 150 MG tablet Take 150 mg by mouth at bedtime.  (Patient not taking: Reported on 12/09/2020)   TRELEGY ELLIPTA 100-62.5-25 MCG/INH AEPB Inhale 1 puff into the lungs daily.   No facility-administered encounter medications on file as of 12/09/2020.    Surgical History: Past Surgical History:  Procedure Laterality Date   CARDIAC CATHETERIZATION     CATARACT EXTRACTION W/PHACO Right 09/01/2015   Procedure: CATARACT EXTRACTION PHACO AND INTRAOCULAR LENS PLACEMENT (IOC);  Surgeon: Sallee Lange, MD;  Location: [facility] ORS;  Service: Ophthalmology;  Laterality: Right;  [place] 01:33 AP% 24.9 CDE 42.99 fluid  pack lot # [id number]   CATARACT EXTRACTION W/PHACO Left 09/22/2015   Procedure: CATARACT EXTRACTION PHACO AND INTRAOCULAR LENS PLACEMENT (IOC);  Surgeon: Sallee Lange, MD;  Location: [facility] ORS;  Service: Ophthalmology;  Laterality: Left;  [place] 01:28 AP% 20.9 CDE 29.54 fluid pack lot # [id number]   CHOLECYSTECTOMY     EYE SURGERY     Orbital Decompression     TONSILLECTOMY      Medical History: Past Medical History:  Diagnosis Date   Arthritis    Asthma    CHF (congestive heart failure) (HCC)    COPD (chronic obstructive pulmonary disease) (HCC)    Diabetes mellitus without complication (HCC)    GERD (gastroesophageal reflux disease)    H/O Graves' disease    H/O wheezing    HOH (hard of hearing)    Hypercholesterolemia    Hypertension    Hypothyroidism    Lower extremity edema    Multiple allergies    Palpitations    Peripheral vascular disease (HCC)    swelling of feet and legs   Pneumonia    in the past   PONV (postoperative nausea and vomiting)    Shortness of breath dyspnea    Sleep apnea    CPAP    Family History: Family History  Problem Relation Age of Onset   Diabetes Father     Social History   Socioeconomic History   Marital status: Married    Spouse name: Not on file   Number of children: Not on file   Years of education: Not on file   Highest education level: Not on file  Occupational History   Not on file  Tobacco Use   Smoking status: Former    Pack years: 0.00   Smokeless tobacco: Never  Substance and Sexual Activity   Alcohol use: Yes    Comment: couple glasses of wine daily   Drug use: No   Sexual activity: Not on file  Other Topics Concern   Not on file  Social History Narrative   Not on file   Social Determinants of Health   Financial Resource Strain: Not on file  Food Insecurity: Not on file  Transportation Needs: Not on file  Physical Activity: Not on file  Stress: Not on file  Social Connections: Not on file  Intimate  Partner Violence: Not on file      Review of Systems  Constitutional:  Positive for fatigue. Negative for appetite change, chills, fever and unexpected weight change.  HENT: Negative.    Eyes: Negative.   Respiratory:  Positive for shortness of breath and wheezing. Negative for cough and chest tightness.   Cardiovascular:  Negative for chest pain.  Gastrointestinal:  Negative for abdominal pain, constipation, diarrhea, nausea and vomiting.  Musculoskeletal:  Positive for arthralgias and gait problem.  Neurological:  Positive  for weakness. Negative for dizziness, light-headedness and headaches.  Psychiatric/Behavioral:  Negative for behavioral problems, self-injury and suicidal ideas. The patient is not nervous/anxious.    Vital Signs: BP 129/64   Pulse 78   Temp 98 F (36.7 C)   Resp 16   Ht 5\' 10"  (1.778 m)   Wt 248 lb 12.8 oz (112.9 kg)   SpO2 92%   BMI 35.70 kg/m    Physical Exam Vitals reviewed.  Constitutional:      General: He is not in acute distress.    Appearance: Normal appearance. He is obese. He is not ill-appearing.  HENT:     Head: Normocephalic and atraumatic.  Cardiovascular:     Rate and Rhythm: Normal rate and regular rhythm.     Pulses: Normal pulses.     Heart sounds: Normal heart sounds. No murmur heard. Pulmonary:     Effort: Pulmonary effort is normal. No respiratory distress.     Breath sounds: Normal breath sounds. No wheezing.  Skin:    General: Skin is warm and dry.     Capillary Refill: Capillary refill takes less than 2 seconds.  Neurological:     Mental Status: He is alert and oriented to person, place, and time.     Motor: Weakness present.     Gait: Gait abnormal.     Assessment/Plan: 1. Chronic respiratory failure with hypoxia (HCC) [name] uses continuous supplemental oxygen.   2. Chronic obstructive pulmonary disease, unspecified COPD type (HCC) Uses trelegy ellipta inhaler, refills ordered. - TRELEGY ELLIPTA 100-62.5-25  MCG/INH AEPB; Inhale 1 puff into the lungs daily.  Dispense: 3 each; Refill: 2  3. Abnormal gait due to muscle weakness [name] has an abnormal gait due to muscle weakness and has difficulty ambulating more than 20 feet without assistance.    General Counseling: [name] understanding of the findings of todays visit and agrees with plan of treatment. I have discussed any further diagnostic evaluation that may be needed or ordered today. We also reviewed his medications today. he has been encouraged to call the office with any questions or concerns that should arise related to todays visit.    No orders of the defined types were placed in this encounter.   Meds ordered this encounter  Medications   TRELEGY ELLIPTA 100-62.5-25 MCG/INH AEPB    Sig: Inhale 1 puff into the lungs daily.    Dispense:  3 each    Refill:  2    Return in about 4 months (around 04/11/2021) for F/U, pulmonary only, Yoshiharu Brassell PCP.   Total time spent:30 Minutes Time spent includes review of chart, medications, test results, and follow up plan with the patient.   [place] Controlled Substance Database was reviewed by me.  This patient was seen by 13/10/2020, FNP-C in collaboration with Dr. Sallyanne Kuster as a part of collaborative care agreement.   Mackenzy Grumbine R. Beverely Risen, MSN, FNP-C Internal medicine

## 2020-12-13 DIAGNOSIS — J9611 Chronic respiratory failure with hypoxia: Secondary | ICD-10-CM | POA: Insufficient documentation

## 2020-12-13 DIAGNOSIS — J449 Chronic obstructive pulmonary disease, unspecified: Secondary | ICD-10-CM | POA: Insufficient documentation

## 2020-12-15 DIAGNOSIS — M778 Other enthesopathies, not elsewhere classified: Secondary | ICD-10-CM | POA: Insufficient documentation

## 2020-12-15 DIAGNOSIS — M19029 Primary osteoarthritis, unspecified elbow: Secondary | ICD-10-CM | POA: Insufficient documentation

## 2020-12-15 DIAGNOSIS — M25522 Pain in left elbow: Secondary | ICD-10-CM | POA: Insufficient documentation

## 2020-12-23 ENCOUNTER — Other Ambulatory Visit: Payer: Medicare Other | Admitting: Nurse Practitioner

## 2020-12-23 ENCOUNTER — Encounter: Payer: Self-pay | Admitting: Nurse Practitioner

## 2020-12-23 ENCOUNTER — Other Ambulatory Visit: Payer: Self-pay

## 2020-12-23 DIAGNOSIS — R5381 Other malaise: Secondary | ICD-10-CM

## 2020-12-23 DIAGNOSIS — Z515 Encounter for palliative care: Secondary | ICD-10-CM

## 2020-12-23 DIAGNOSIS — R0602 Shortness of breath: Secondary | ICD-10-CM

## 2020-12-23 NOTE — Progress Notes (Signed)
[facility] Consult Note Telephone: [phone]  Fax: [phone]    Date of encounter: 12/23/20 PATIENT NAME: [name] [address] [place] [address]   [phone] (home)  DOB: 1935/11/05 MRN: [id number] PRIMARY CARE PROVIDER:    Lenard Simmer, MD,  [address] [place] [address] [phone]  RESPONSIBLE PARTY:    Contact Information     Name Relation Home Work [place] Spouse [phone]        I met face to face with patient  with wife in home. Palliative Care was asked to follow this patient by consultation request of  Morayati, Lourdes Sledge, MD to address advance care planning and complex medical decision making. This is a follow up visit. ASSESSMENT AND PLAN / RECOMMENDATIONS:  Symptom Management/Plan: 1. ACP; DNR/MOST form in vynca   2. Shortness of breath with debility secondary to COPD, CHF, continue to monitor respiratory status, inhalation therapy, supplemental O2, continue trilogy   3. Palliative care encounter; Palliative care encounter; Palliative medicine team will continue to support patient, patient's family, and medical team. Visit consisted of counseling and education dealing with the complex and emotionally intense issues of symptom management and palliative care in the setting of serious and potentially life-threatening illness  4. Debility secondary to COPD; CHF; continue to encourage energy conservation; mobility when feeling well.  Follow up Palliative Care Visit: Palliative care will continue to follow for complex medical decision making, advance care planning, and clarification of goals. Return 6 weeks or prn.  I spent 45 minutes providing this consultation. More than 50% of the time in this consultation was spent in counseling and care coordination. PPS: 50%  Chief Complaint: Follow up Palliative consult for complex medical decision making  HISTORY OF PRESENT ILLNESS:   [name] is a 85 y.o. 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 On 2L continuous oxygen, trilogy. I called Mr. Mays to confirm palliative care in person visit and covid screening which is negative. Mr. and Mrs. Tesar in their home. We talked about how Mr. Penn has been doing. We talked about Mr Brar energy levels, today is a good day. Mr. Mahmood endorses he and a friend will be going to get peaches today. Mr. Trickett endorses it will be the first time in months he has been out of the house except for MD appointments. We talked about shortness of breath. We talked about use of O2, trilogy. We talked about what makes Mr. Lieurance more short of breath with exertion, depending on weather also. We talked about fatigue. We talked about left elbow pain which has now resolved with prednisone coarse. We talked about appetite which is good. Mr. Meeker endorses her and Mrs. Sthilaire have been cutting back on the amounts of food they eat. We talked about weight loss which was around 20lbs. Mr. Mcsweeney endorses it has made him feel so much better. It makes mobility easier. Mr. Menz endorses he has been making healthy food choices. We talked about medical goals. We talked about chronic disease. Mr. Tremblay endorses he started having tremors bilateral hands, intermit worsens with holding a phone or cup. Mr. Griffing endorses it does not happen continuously. We talked about importance of hydration,  checking blood sugars when tremors begin. We talked about essential tremor. Mr. Conery endorses he has an appointment with Mr. Ronnald Collum next week he will further discuss at that time. We  talked about f/u PC visit, Mr. Viveros in agreement. Therapeutic listening, emotional support provided. Questions answered.   History obtained from review of EMR, discussion with Mrs and Mr. Giannelli.  I reviewed available labs, medications, imaging, studies and related documents from the EMR.  Records reviewed and summarized above.   ROS Full 14 system review of systems performed and negative with exception of: as per HPI.   Physical Exam: Constitutional: NAD General: chronically ill pleasant male EYES: lids intact ENMT: oral mucous membranes moist CV: S1S2, RRR Pulmonary: LCTA, no increased work of breathing, no cough Abdomen: soft and non tender, MSK: ambulatory Skin: warm and dry Neuro:  + generalized weakness,  no cognitive impairment Psych: non-anxious affect, A and O x 3  Questions and concerns were addressed. The patient/family was encouraged to call with questions and/or concerns. My business card was provided. Provided general support and encouragement, no other unmet needs identified   Thank you for the opportunity to participate in the care of Mr. Mcmanus.  The palliative care team will continue to follow. Please call our office at [phone] if we can be of additional assistance.   This chart was dictated using voice recognition software.  Despite best efforts to proofread,  errors can occur which can change the documentation meaning.   Vannary Greening Z Shandie Bertz, NP   COVID-19 PATIENT SCREENING TOOL Asked and negative response unless otherwise noted:   Have you had symptoms of covid, tested positive or been in contact with someone with symptoms/positive test in the past 5-10 days? NO

## 2021-01-14 ENCOUNTER — Telehealth: Payer: Self-pay

## 2021-01-14 NOTE — Telephone Encounter (Signed)
Faxed power mobility device eval to [phone]

## 2021-02-19 ENCOUNTER — Other Ambulatory Visit: Payer: Self-pay

## 2021-02-19 DIAGNOSIS — J449 Chronic obstructive pulmonary disease, unspecified: Secondary | ICD-10-CM

## 2021-02-19 MED ORDER — TRELEGY ELLIPTA 100-62.5-25 MCG/INH IN AEPB: 1.0000 | INHALATION_SPRAY | Freq: Every day | RESPIRATORY_TRACT | 2 refills | Status: DC

## 2021-02-25 ENCOUNTER — Other Ambulatory Visit: Payer: Medicare Other | Admitting: Nurse Practitioner

## 2021-02-25 ENCOUNTER — Encounter: Payer: Self-pay | Admitting: Nurse Practitioner

## 2021-02-25 DIAGNOSIS — Z515 Encounter for palliative care: Secondary | ICD-10-CM

## 2021-02-25 DIAGNOSIS — R5381 Other malaise: Secondary | ICD-10-CM

## 2021-02-25 DIAGNOSIS — G8929 Other chronic pain: Secondary | ICD-10-CM

## 2021-02-25 DIAGNOSIS — G4701 Insomnia due to medical condition: Secondary | ICD-10-CM

## 2021-02-25 NOTE — Progress Notes (Signed)
[facility] Consult Note Telephone: [phone]  Fax: [phone]    Date of encounter: 02/25/21 9:51 PM PATIENT NAME: [name] [address] [place] [address]   [phone] (home)  DOB: 1935/09/26 MRN: [id number] PRIMARY CARE PROVIDER:    Lenard Simmer, MD,  [address] [place] [address] [phone] RESPONSIBLE PARTY:    Contact Information     Name Relation Home Work [name] Spouse [phone]        I met face to face with patient and family in home. Palliative Care was asked to follow this patient by consultation request of  Morayati, Lourdes Sledge, MD to address advance care planning and complex medical decision making. This is a follow up visit.                                  ASSESSMENT AND PLAN / RECOMMENDATIONS:  Symptom Management/Plan: 1. ACP; DNR/MOST form in vynca; revisited Hospice benefit through Medicare. We talked about now Hospice is covering trilogy. We talked about benefits. We talked about PC vs Hospice. [name] endorses he will think about it, though prefers to stay with PC.    2. Shortness of breath with debility secondary to COPD, CHF, continue to monitor respiratory status, inhalation therapy, supplemental O2, continue trilogy   3. Palliative care encounter; Palliative care encounter; Palliative medicine team will continue to support patient, patient's family, and medical team. Visit consisted of counseling and education dealing with the complex and emotionally intense issues of symptom management and palliative care in the setting of serious and potentially life-threatening illness   4. Debility secondary to COPD; CHF; continue to encourage energy conservation; mobility when feeling well.   5. Pain chronic secondary to OA; discussed at length, will try meloxicam to see if will give [name] some relief. We talked about mobiity.   6. Insomnia, discussed sleep, sleep  patterns, sleep hygiene. Discussed melatonin. We talked about trazodone. [name] in agreement to try trazodone as his sleep patterns continue to worsen, difficulty getting and staying asleep.   Rx: Meloxicam 7.29m po daily; #30; No RF Rx: Trazodone 532mqhs; #30; No RF Follow up Palliative Care Visit: Palliative care will continue to follow for complex medical decision making, advance care planning, and clarification of goals. Return 3 weeks or prn.  I spent 62 minutes providing this consultation. More than 50% of the time in this consultation was spent in counseling and care coordination.  PPS: 50%  Chief Complaint: Follow up palliative consult for complex medical decision making  HISTORY OF PRESENT ILLNESS:  Aaron DOSHIs a [age].o. 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 On 2L continuous oxygen, trilogy. I called [name] confirm in person PC visit and covid screening negative. Aaron. [name] agreement. Aaron. [name] sitting in his kitchen. We talked about how he has been feeling. We talked about symptoms of pain, including his bilateral knees. He has been using ibprofen with little relief. We discussed at length, will try meloxicam to see if will give Aaron. [name] relief. We talked about mobiity. We talked about Shortness of breath  with debility secondary to COPD, CHF, continue to monitor respiratory status, inhalation therapy, supplemental O2, continue trilogy. We talked about Debility secondary to COPD; CHF; continue to encourage energy conservation; mobility when feeling well. [name] talked about his electric w/c he has received since  last PC visit. We talked about Insomnia, discussed sleep, sleep patterns, sleep hygiene. Discussed melatonin. We talked about trazodone. [name] in agreement to try trazodone as his sleep patterns continue to worsen, difficulty getting and staying asleep. We talked about appetite, though it does appear [name] has been continuing to loose weight, intentional. [name] endorses about 5 more pounds. Nutrition reviewed, supplements. We talked about medical goals. 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 We talked about good and bad days. We talked about quality of life. We talked about Hospice benefit through Medicare. We talked about now Hospice is covering trilogy. We talked about benefits. We talked about PC vs Hospice. [name] endorses he will think about it, though prefers to stay with PC. We talked about f/u pc visit in 3 weeks prior to end of rx's. Asked [name] to contact in 1 week to see if any improvements. [name] in agreement. Therapeutic listening, emotional support provided. Questions answered. F/u PC visit scheduled   History obtained from review of EMR, discussion with [name].  I reviewed available labs, medications, imaging, studies and related documents from the EMR.  Records reviewed and summarized above.   ROS Full 14 system review of systems performed and negative with exception of: as per HPI.   Physical Exam: Constitutional: NAD General: frail appearing, chronically ill male EYES: lids intact, no discharge  ENMT: oral mucous membranes moist CV: S1S2, RRR, mild BLE edema Pulmonary: LCTA, no increased work of breathing, no cough, rO2 Abdomen: soft and non tender MSK: ambulatory with walker, w/c Skin: warm and dry Neuro:  + generalized weakness,  no cognitive impairment Psych: non-anxious affect, A and O x 3  Questions and concerns were addressed. The patient/family was encouraged to call with questions  and/or concerns. My business card was provided. Provided general support and encouragement, no other unmet needs identified   Thank you for the opportunity to participate in the care of [name].  The palliative care team will continue to follow. Please call our office at [phone] if we can be of additional assistance.   This chart was dictated using voice recognition software.  Despite best efforts to proofread,  errors can occur which can change the documentation meaning.   Christin Z Gusler, NP   COVID-19 PATIENT SCREENING TOOL Asked and negative response unless otherwise noted:   Have you had symptoms of covid, tested positive or been in contact with someone with symptoms/positive test in the past 5-10 days?  NO

## 2021-02-27 ENCOUNTER — Other Ambulatory Visit: Payer: Self-pay

## 2021-03-20 ENCOUNTER — Encounter: Payer: Self-pay | Admitting: Nurse Practitioner

## 2021-03-20 ENCOUNTER — Other Ambulatory Visit: Payer: Self-pay

## 2021-03-20 ENCOUNTER — Other Ambulatory Visit: Payer: Self-pay | Admitting: Internal Medicine

## 2021-03-20 ENCOUNTER — Other Ambulatory Visit: Payer: Medicare Other | Admitting: Nurse Practitioner

## 2021-03-20 DIAGNOSIS — J449 Chronic obstructive pulmonary disease, unspecified: Secondary | ICD-10-CM

## 2021-03-20 DIAGNOSIS — G8929 Other chronic pain: Secondary | ICD-10-CM

## 2021-03-20 DIAGNOSIS — Z515 Encounter for palliative care: Secondary | ICD-10-CM

## 2021-03-20 DIAGNOSIS — G4701 Insomnia due to medical condition: Secondary | ICD-10-CM

## 2021-03-20 DIAGNOSIS — R5381 Other malaise: Secondary | ICD-10-CM

## 2021-03-20 NOTE — Progress Notes (Signed)
Therapist, nutritional Palliative Care Consult Note Telephone: [phone]  Fax: [phone]    Date of encounter: 03/20/21 10:33 PM PATIENT NAME: [name] [address] [place] [address]   [phone] (home)  DOB: [birth year] MRN: [id number] PRIMARY CARE PROVIDER:    Alan Mulder, MD,  [address] [place] [address] [phone]  RESPONSIBLE PARTY:    Contact Information     Name Relation Home Work Mobile   [name], [name] [phone]        Due to the COVID-19 crisis, this visit was done via telemedicine from my office and it was initiated and consent by this patient and or family.  I connected with  [name] OR PROXY on 03/20/21 by a video enabled telemedicine application and verified that I am speaking with the correct person.   I discussed the limitations of evaluation and management by telemedicine. The patient expressed understanding and agreed to proceed. This is a follow up visit.                                  ASSESSMENT AND PLAN / RECOMMENDATIONS: Symptom Management/Plan: 1. ACP; DNR/MOST form in vynca  2. Shortness of breath with debility secondary to COPD, CHF, continue to monitor respiratory status, inhalation therapy, supplemental O2, continue trilogy   3. Palliative care encounter; Palliative care encounter; Palliative medicine team will continue to support patient, patient's family, and medical team. Visit consisted of counseling and education dealing with the complex and emotionally intense issues of symptom management and palliative care in the setting of serious and potentially life-threatening illness   4. Debility secondary to COPD; CHF; continue to encourage energy conservation; mobility when feeling well.    5. Pain chronic secondary to OA; discussed at length, will continue Meloxican at lowest dose discussed trying increasing dose. [name], no side effects. We talked about mobiity.     6. Insomnia, discussed sleep, sleep patterns, sleep hygiene. Discussed trazodone which has not been helping, discussed option of increasing trazodone. [name] in agreement.    Rx: Meloxicam 15 mg po daily; #30; 1 RF Rx: Trazodone 100 mg qhs; #30; 1 RF   Follow up Palliative Care Visit: Palliative care will continue to follow for complex medical decision making, advance care planning, and clarification of goals. Return 2 weeks or prn.  I spent 27 minutes providing this consultation. More than 50% of the time in this consultation was spent in counseling and care coordination.  PPS: 50%  Chief Complaint: Follow up palliative consult for complex medical decision making  HISTORY OF PRESENT ILLNESS:  ZIGMOND TRELA is a 85 y.o. 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 On 2L continuous oxygen, trilogy. I called [name], we talked about how he has been feeling. We reviewed symptoms including insomnia, pain, shortness of breath. We talked about Pain chronic secondary to OA; discussed at length, will continue Meloxican at lowest dose discussed trying increasing dose. [name], no side effects. We talked about mobiity. We talked more extensively about  Insomnia, discussed sleep, sleep patterns, sleep hygiene. Discussed trazodone which has not been helping, discussed option of increasing trazodone. [name] in agreement. We talked about appetite. No other changes. We talked about medical goals, chronic disease, goals. We talked about role pc in poc. We talked about f/u pc visit, [name] in agreement, scheduled. Therapeutic listening, emotional  support provided.     History obtained from review of EMR, discussion with [name].  I reviewed available labs, medications, imaging, studies and related documents from the EMR.  Records reviewed and summarized above.   ROS Full 10 system review of systems performed and negative with exception of: as per HPI.   Physical Exam: deferred Questions and concerns were addressed. The patient/family was encouraged to call with questions and/or concerns. My contact information was provided. Provided general support and encouragement, no other unmet needs identified   Thank you for the opportunity to participate in the care of [name].  The palliative care team will continue to follow. Please call our office at [phone] if we can be of additional assistance.   This chart was dictated using voice recognition software.  Despite best efforts to proofread,  errors can occur which can change the documentation meaning.   Aaron Gaddie Prince [place], NP

## 2021-03-30 ENCOUNTER — Telehealth: Payer: Self-pay | Admitting: Nurse Practitioner

## 2021-03-30 NOTE — Telephone Encounter (Signed)
[name] called sharing he is coughing up yellow sputum, no increase in sob, no fever. We talked about symptoms. Discussed will send in Augmentin but if worsens to contact primary or pulmonary for f/u with possible cxr with co-morbidities. Will have Willette Pa RN PC schedule visit this week for assessment, f/u . [name] in agreement Rx: Augmentin 875/125mg  po bid x 14 days; #28; No RF  Total time 20 minutes Documentation 5 minutes Phone discussion 15 minutes

## 2021-03-31 ENCOUNTER — Telehealth: Payer: Self-pay

## 2021-03-31 NOTE — Telephone Encounter (Signed)
137 pm. Phone call made to [name] to schedule a follow up visit at the request of [name], NP.  Patient is agreeable to tomorrow at 130 pm.

## 2021-04-01 ENCOUNTER — Other Ambulatory Visit: Payer: Self-pay

## 2021-04-01 ENCOUNTER — Other Ambulatory Visit: Payer: Medicare Other

## 2021-04-01 VITALS — BP 152/74 | HR 69 | Temp 98.6°F | Wt 260.6 lb

## 2021-04-01 DIAGNOSIS — Z515 Encounter for palliative care: Secondary | ICD-10-CM

## 2021-04-01 NOTE — Progress Notes (Signed)
PATIENT NAME: [name] DOB: 1936/05/07 MRN: [id number]  PRIMARY CARE PROVIDER: Alan Mulder, MD  RESPONSIBLE PARTY:  Acct ID - Guarantor Home Phone Work Phone Relationship Acct Type  [id number] - Roemer,Zyir* [phone]  Self P/F     [address], [place], [place] [address]    PLAN OF CARE and INTERVENTIONS:               1.  GOALS OF CARE/ ADVANCE CARE PLANNING:  Remain home under the care of his spouse.               2.  PATIENT/CAREGIVER EDUCATION:  Use of duonebs and CHF.               4. PERSONAL EMERGENCY PLAN:  Activate 911 for emergencies.                5.  DISEASE STATUS:  Follow up visit requested by [name], NP due to worsening respiratory status.  Patient was started on Augmentin this week due to respiratory issues.  Continues with a productive cough with occasional yellow sputum production, shortness of breath worsened over the week and worsening fatigue. Patient using albuterol hhn as needed.  We discussed using duonebs instead of albuterol to see if this would give better relief with shortness of breath.  Patient sustained a fall last week.  Bruising and redness noted to his right eye.   12.6 weight gain noted since last visit pulmonology visit in July.  Bilateral 1-2 + edema present in thighs down to ankles.   Patient is currently taking Furosemide daily.  Discussed weighing himself more often and updating PCP of weight gain.  Education provided on CHF symptoms.  Given worsening condition and increase weight gain, consulted with [name], NP.  It is advised that patient follow up with PCP, Pulmonology or have and ED evaluation.  Patient reports pulmonology visit is scheduled for 04/13/21.  He will contact PCP to follow up this week.  Patient would like to avoid ED visit if possible but is aware he should go if his condition continues to worsen.    HISTORY OF PRESENT ILLNESS:  85 year old male with COPD, Neuropathy, Diabetes and HTN.  Patient is being  followed by Palliative Care every 4 weeks and PRN.  CODE STATUS: DNR ADVANCED DIRECTIVES: No MOST FORM: Yes PPS: 50%   PHYSICAL EXAM:   VITALS: Today's Vitals   04/01/21 1328  BP: (!) 152/74  Pulse: 69  Temp: 98.6 F (37 C)  SpO2: 93%  Weight: 260 lb 9.6 oz (118.2 kg)  PainSc: 0-No pain    LUNGS: clear to auscultation , decreased breath sounds CARDIAC: Cor RRR}  EXTREMITIES: 1-2 + edema present from thigh to ankles bilaterally SKIN: Skin color, texture, turgor normal. No rashes or lesions or ecchymoses - scattered  NEURO: positive for gait problems       Truitt Merle, RN

## 2021-04-10 ENCOUNTER — Emergency Department (HOSPITAL_BASED_OUTPATIENT_CLINIC_OR_DEPARTMENT_OTHER)
Admission: EM | Admit: 2021-04-10 | Discharge: 2021-04-10 | Disposition: A | Discharge status: Home / Self Care | Payer: Medicare Other | Attending: Emergency Medicine | Admitting: Emergency Medicine

## 2021-04-10 ENCOUNTER — Other Ambulatory Visit: Payer: Self-pay

## 2021-04-10 ENCOUNTER — Encounter: Payer: Self-pay | Admitting: Nurse Practitioner

## 2021-04-10 ENCOUNTER — Other Ambulatory Visit: Payer: Medicare Other | Admitting: Nurse Practitioner

## 2021-04-10 ENCOUNTER — Emergency Department (HOSPITAL_BASED_OUTPATIENT_CLINIC_OR_DEPARTMENT_OTHER): Payer: Medicare Other

## 2021-04-10 ENCOUNTER — Encounter (HOSPITAL_BASED_OUTPATIENT_CLINIC_OR_DEPARTMENT_OTHER): Payer: Self-pay | Admitting: *Deleted

## 2021-04-10 DIAGNOSIS — I110 Hypertensive heart disease with heart failure: Secondary | ICD-10-CM | POA: Insufficient documentation

## 2021-04-10 DIAGNOSIS — Z87891 Personal history of nicotine dependence: Secondary | ICD-10-CM | POA: Diagnosis not present

## 2021-04-10 DIAGNOSIS — J441 Chronic obstructive pulmonary disease with (acute) exacerbation: Secondary | ICD-10-CM | POA: Diagnosis not present

## 2021-04-10 DIAGNOSIS — Z7951 Long term (current) use of inhaled steroids: Secondary | ICD-10-CM | POA: Insufficient documentation

## 2021-04-10 DIAGNOSIS — Z7984 Long term (current) use of oral hypoglycemic drugs: Secondary | ICD-10-CM | POA: Diagnosis not present

## 2021-04-10 DIAGNOSIS — Z20822 Contact with and (suspected) exposure to covid-19: Secondary | ICD-10-CM | POA: Insufficient documentation

## 2021-04-10 DIAGNOSIS — Z7982 Long term (current) use of aspirin: Secondary | ICD-10-CM | POA: Insufficient documentation

## 2021-04-10 DIAGNOSIS — E039 Hypothyroidism, unspecified: Secondary | ICD-10-CM | POA: Insufficient documentation

## 2021-04-10 DIAGNOSIS — J45909 Unspecified asthma, uncomplicated: Secondary | ICD-10-CM | POA: Diagnosis not present

## 2021-04-10 DIAGNOSIS — I509 Heart failure, unspecified: Secondary | ICD-10-CM | POA: Diagnosis not present

## 2021-04-10 DIAGNOSIS — E119 Type 2 diabetes mellitus without complications: Secondary | ICD-10-CM | POA: Insufficient documentation

## 2021-04-10 DIAGNOSIS — Z79899 Other long term (current) drug therapy: Secondary | ICD-10-CM | POA: Diagnosis not present

## 2021-04-10 DIAGNOSIS — R0602 Shortness of breath: Secondary | ICD-10-CM | POA: Diagnosis present

## 2021-04-10 LAB — COMPREHENSIVE METABOLIC PANEL
ALT: 8 U/L (ref 0–44)
AST: 9 U/L — ABNORMAL LOW (ref 15–41)
Albumin: 3.6 g/dL (ref 3.5–5.0)
Alkaline Phosphatase: 60 U/L (ref 38–126)
Anion gap: 7 (ref 5–15)
BUN: 29 mg/dL — ABNORMAL HIGH (ref 8–23)
CO2: 34 mmol/L — ABNORMAL HIGH (ref 22–32)
Calcium: 8.9 mg/dL (ref 8.9–10.3)
Chloride: 93 mmol/L — ABNORMAL LOW (ref 98–111)
Creatinine, Ser: 1.5 mg/dL — ABNORMAL HIGH (ref 0.61–1.24)
GFR, Estimated: 46 mL/min — ABNORMAL LOW (ref >60)
Glucose, Bld: 145 mg/dL — ABNORMAL HIGH (ref 70–99)
Potassium: 4.9 mmol/L (ref 3.5–5.1)
Sodium: 134 mmol/L — ABNORMAL LOW (ref 135–145)
Total Bilirubin: 1.4 mg/dL — ABNORMAL HIGH (ref 0.3–1.2)
Total Protein: 7.6 g/dL (ref 6.5–8.1)

## 2021-04-10 LAB — TROPONIN I (HIGH SENSITIVITY): Troponin I (High Sensitivity): 5 ng/L (ref <18)

## 2021-04-10 LAB — CBC WITH DIFFERENTIAL/PLATELET
Abs Immature Granulocytes: 0.02 10*3/uL (ref 0.00–0.07)
Basophils Absolute: 0.1 10*3/uL (ref 0.0–0.1)
Basophils Relative: 1 %
Eosinophils Absolute: 0.2 10*3/uL (ref 0.0–0.5)
Eosinophils Relative: 2 %
HCT: 39.6 % (ref 39.0–52.0)
Hemoglobin: 12.9 g/dL — ABNORMAL LOW (ref 13.0–17.0)
Immature Granulocytes: 0 %
Lymphocytes Relative: 13 %
Lymphs Abs: 1.3 10*3/uL (ref 0.7–4.0)
MCH: 33.8 pg (ref 26.0–34.0)
MCHC: 32.6 g/dL (ref 30.0–36.0)
MCV: 103.7 fL — ABNORMAL HIGH (ref 80.0–100.0)
Monocytes Absolute: 0.8 10*3/uL (ref 0.1–1.0)
Monocytes Relative: 9 %
Neutro Abs: 7 10*3/uL (ref 1.7–7.7)
Neutrophils Relative %: 75 %
Platelets: 174 10*3/uL (ref 150–400)
RBC: 3.82 MIL/uL — ABNORMAL LOW (ref 4.22–5.81)
RDW: 14.6 % (ref 11.5–15.5)
WBC: 9.3 10*3/uL (ref 4.0–10.5)
nRBC: 0 % (ref 0.0–0.2)

## 2021-04-10 LAB — RESP PANEL BY RT-PCR (FLU A&B, COVID) ARPGX2
Influenza A by PCR: NEGATIVE
Influenza B by PCR: NEGATIVE
SARS Coronavirus 2 by RT PCR: NEGATIVE

## 2021-04-10 LAB — BRAIN NATRIURETIC PEPTIDE: B Natriuretic Peptide: 177.5 pg/mL — ABNORMAL HIGH (ref 0.0–100.0)

## 2021-04-10 LAB — LACTIC ACID, PLASMA: Lactic Acid, Venous: 1.8 mmol/L (ref 0.5–1.9)

## 2021-04-10 MED ORDER — MAGNESIUM SULFATE 2 GM/50ML IV SOLN
2.0000 g | Freq: Once | INTRAVENOUS | Status: AC
  Administered 2021-04-10: 2 g via INTRAVENOUS

## 2021-04-10 MED ORDER — AEROCHAMBER PLUS FLO-VU LARGE MISC
1.0000 | Freq: Once | Status: AC
  Administered 2021-04-10: 1
  Filled 2021-04-10: qty 1

## 2021-04-10 MED ORDER — IPRATROPIUM-ALBUTEROL 0.5-2.5 (3) MG/3ML IN SOLN
3.0000 mL | Freq: Once | RESPIRATORY_TRACT | Status: AC
  Administered 2021-04-10: 3 mL via RESPIRATORY_TRACT
  Filled 2021-04-10: qty 3

## 2021-04-10 MED ORDER — METHYLPREDNISOLONE SODIUM SUCC 125 MG IJ SOLR
125.0000 mg | Freq: Once | INTRAMUSCULAR | Status: AC
  Administered 2021-04-10: 125 mg via INTRAVENOUS
  Filled 2021-04-10: qty 2

## 2021-04-10 MED ORDER — PREDNISONE 20 MG PO TABS: 20.0000 mg | ORAL_TABLET | Freq: Two times a day (BID) | ORAL | 0 refills | Status: AC

## 2021-04-10 MED ORDER — ALBUTEROL (5 MG/ML) CONTINUOUS INHALATION SOLN
10.0000 mg/h | INHALATION_SOLUTION | Freq: Once | RESPIRATORY_TRACT | Status: DC
  Filled 2021-04-10: qty 20

## 2021-04-10 MED ORDER — ALBUTEROL SULFATE HFA 108 (90 BASE) MCG/ACT IN AERS
2.0000 | INHALATION_SPRAY | Freq: Four times a day (QID) | RESPIRATORY_TRACT | Status: DC | PRN
  Administered 2021-04-10: 2 via RESPIRATORY_TRACT

## 2021-04-10 MED ORDER — ALBUTEROL SULFATE (2.5 MG/3ML) 0.083% IN NEBU
2.5000 mg | INHALATION_SOLUTION | Freq: Once | RESPIRATORY_TRACT | Status: AC
  Administered 2021-04-10: 2.5 mg via RESPIRATORY_TRACT
  Filled 2021-04-10: qty 3

## 2021-04-10 MED ORDER — MAGNESIUM SULFATE 50 % IJ SOLN
2.0000 g | Freq: Once | INTRAMUSCULAR | Status: DC
  Filled 2021-04-10: qty 4

## 2021-04-10 MED ORDER — IPRATROPIUM-ALBUTEROL 0.5-2.5 (3) MG/3ML IN SOLN
3.0000 mL | Freq: Once | RESPIRATORY_TRACT | Status: AC
Start: 2021-04-10 — End: 2021-04-10
  Administered 2021-04-10: 3 mL via RESPIRATORY_TRACT
  Filled 2021-04-10: qty 3

## 2021-04-10 NOTE — ED Notes (Signed)
Pt placed on simple mask d/t mouth breathing. Pt states it is more comfortable.

## 2021-04-10 NOTE — Discharge Instructions (Addendum)
Call your primary care doctor or specialist as discussed in the next 2-3 days.   Return immediately back to the ER if:  Your symptoms worsen within the next 12-24 hours. You develop new symptoms such as new fevers, persistent vomiting, new pain, shortness of breath, or new weakness or numbness, or if you have any other concerns.  

## 2021-04-10 NOTE — ED Notes (Signed)
RT Note: Pt. has home CPAP(Trilogy) with him and was using in waiting room/triage, uses with 3 lpm Oxygen.

## 2021-04-10 NOTE — ED Triage Notes (Signed)
Pt states his Palliative nurse come today, Lungs are messed up, needs antibiotics, pt has been retaining fluid  for several. Pt has history of COPD

## 2021-04-10 NOTE — ED Notes (Signed)
RT educated pt and family/friend on COPD/Asthma medication and proper administration for better deposition and improved ventilation/oxygenation. Pt only takes Duo Neb nebulizer at home PRN, Pt would benefit from taking nebulizer q4 scheduled, and utilize Albuterol HHN PRN for rescue. Pt also takes Trelegy in the mornings. RT educated pt and instructed pt to take his nebulizer treatment prior to his Trelegy to allow for better deposition. Recommended order of administration of respiratory medications would be Nebulizer first, then MDI/HHN, last DPI/maintenance medication. This would allow for better deposition and better results from treatments. Pt clear/dim upper, dim lowers prior to  administration of  treatment. Post treatment pt clr uppers, clr/dim lowers. Pt stated he feels like he is breathing better and moving air better. Pts respiratory status stable on [place] 3 Lpm w/no distress, mod SOB d/t COPD. RT will continue to monitor.

## 2021-04-10 NOTE — Progress Notes (Signed)
Designer, jewellery Palliative Care Consult Note Telephone: [phone]  Fax: [phone]    Date of encounter: 04/10/21 9:32 PM PATIENT NAME: [name] [address] [place] [address]   [phone] (home)  DOB: [birth year] MRN: [id number] PRIMARY CARE PROVIDER:    Lenard Simmer, MD,  [address] [place] [address] [phone]  RESPONSIBLE PARTY:    Contact Information     Name Relation Home Work [place] E Spouse [phone]        I met face to face with patient and family in home. Palliative Care was asked to follow this patient by consultation request of  [name], Aaron Sledge, MD to address advance care planning and complex medical decision making. This is a follow up visit.                                  ASSESSMENT AND PLAN / RECOMMENDATIONS:  Symptom Management/Plan: 1. ACP; DNR/MOST form in vynca; sent to [facility] for further evaluation of shortness of breath   2. Shortness of breath with debility secondary to possible PNA; COPD, possible CHF, exacerbated; continue to monitor respiratory status, inhalation therapy, supplemental O2, continue trilogy; sent [facility] for further evaluation of shortness of breath   3. Palliative care encounter; Palliative care encounter; Palliative medicine team will continue to support patient, patient's family, and medical team. Visit consisted of counseling and education dealing with the complex and emotionally intense issues of symptom management and palliative care in the setting of serious and potentially life-threatening illness   4. Debility secondary to COPD; CHF; continue to encourage energy conservation; mobility when feeling well.    5. Pain chronic secondary to OA; discussed at length, will refill meloxicam to see if will give [name] some relief. We talked about mobiity.    6. Insomnia, improved with increase dose of trazodone. discussed sleep,  sleep patterns, sleep hygiene. Discussed melatonin. We talked about trazodone. [name] in agreement to try trazodone as his sleep patterns continue to worsen, difficulty getting and staying asleep. Will refill   Rx: Meloxicam 74m po daily; #30; 1RF Rx: Trazodone 587mqhs; #30; 1RF  Follow up Palliative Care Visit: Palliative care will continue to follow for complex medical decision making, advance care planning, and clarification of goals. Return 2 weeks or prn.  I spent 46 minutes providing this consultation. More than 50% of the time in this consultation was spent in counseling and care coordination. PPS: 40%  Chief Complaint: Follow up palliative consult for complex medical decision making  HISTORY OF PRESENT ILLNESS:  [name] a [age].o. 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 On 2L continuous oxygen, trilogy. I called [name] confirm in person PC visit. I visited [name] their home. Aaron. [name] sitting on the stool in the kitchen. He had bruising to right orbit, right eye injected from a recent fall. Aaron HaTurgeonndorses he has had 2 falls recently. He was seen by [name] abx for URI. Aaron. HaHunnicuttndorses he feels slightly better through he cancelled 3 MD appointment because he was unable  to get in the car due to dyspnea, weakness including [name] pulmonary on Monday. We talked about symptoms, appetite poor. We talked about weight gain and increase in lasix per Aaron Ronnald [name]. Edema continues but slightly improved. Discussed Trazodone has improved sleep at increased dose, will refill. We talked about meloxicam  and [name] endorses may help, agree to continue to try. Discussed at length with [name] concern of clinical presentation and recommended [name] to go to [facility], information given, directions. [name] declined EMS. [name] endorses she will take him by POV. Emotional support provided. Will call and schedule f/u visit once returns home as he maybe possible admitted with current condition. Questions answered.   History obtained from review of EMR, discussion with [name].  I reviewed available labs, medications, imaging, studies and related documents from the EMR.  Records reviewed and summarized above.   ROS Full 10 system review of systems performed and negative with exception of: as per HPI.   Physical Exam: Constitutional: NAD General: frail appearing, debilitated, chronically ill male, O2 dependent  EYES: lids intact ENMT:oral mucous membranes moist CV: S1S2, RRR, +BLE edema Pulmonary: Breath sounds tight throughout, exp wheezing; , + increased work of breathing, +cough, room air Abdomen: normo-active BS + 4 quadrants, soft and non tender MSK: ambulatory Skin: warm and dry Neuro:  + generalized weakness,  no cognitive impairment Psych: non-anxious affect, A and O x 3  Questions and concerns were addressed. The patient/family was encouraged to call with questions and/or concerns. My contact information was provided. Provided general support and encouragement, no other unmet needs identified   Thank you for the opportunity to participate in the care of [name].  The palliative care team will continue to follow. Please call our office at [phone] if we can be of additional assistance.   This chart was dictated using voice recognition software.  Despite best efforts to proofread,  errors can occur which can change the documentation meaning.   Aaron Voght Z Masie B[place], NP   COVID-19 PATIENT SCREENING TOOL Asked and negative response unless otherwise  noted:   Have you had symptoms of covid, tested positive or been in contact with someone with symptoms/positive test in the past 5-10 days?  NO

## 2021-04-10 NOTE — ED Provider Notes (Signed)
[facility] EMERGENCY DEPT Provider Note   CSN: [id number] Arrival date & time: 04/10/21  1640     History Chief Complaint  Patient presents with   Shortness of Breath    [name] is a 85 y.o. male.  Patient presents to ER chief complaint of worsening shortness of breath.  Symptoms ongoing for the past week.  He has a palliative care nurse come and check on him, with a checked on him today he was told to go to the ER because his "lung sounded like he might be pneumonia."  Patient otherwise denies any fevers denies any new cough.  Denies any headache or chest pain or abdominal pain.  He states he fell 3 to 4 days ago and is not sure what caused the fall.  However denies any pain from the fall today.      Past Medical History:  Diagnosis Date   Arthritis    Asthma    CHF (congestive heart failure) (HCC)    COPD (chronic obstructive pulmonary disease) (HCC)    Diabetes mellitus without complication (HCC)    GERD (gastroesophageal reflux disease)    H/O Graves' disease    H/O wheezing    HOH (hard of hearing)    Hypercholesterolemia    Hypertension    Hypothyroidism    Lower extremity edema    Multiple allergies    Palpitations    Peripheral vascular disease (HCC)    swelling of feet and legs   Pneumonia    in the past   PONV (postoperative nausea and vomiting)    Shortness of breath dyspnea    Sleep apnea    CPAP    Patient Active Problem List   Diagnosis Date Noted   Chronic respiratory failure with hypoxia (HCC) 12/13/2020   Chronic obstructive pulmonary disease (HCC) 12/13/2020   HYPONATREMIA, CHRONIC 03/12/2007   PERIPHERAL NEUROPATHY 03/12/2007   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   HX, PERSONAL, TOBACCO USE 03/12/2007   HYPOTHYROIDISM 03/09/2007   DIABETES MELLITUS, TYPE II 03/09/2007   HYPERLIPIDEMIA 03/09/2007   HYPERTENSION 03/09/2007   BENIGN PROSTATIC HYPERTROPHY 03/09/2007    Past Surgical History:  Procedure Laterality Date    CARDIAC CATHETERIZATION     CATARACT EXTRACTION W/PHACO Right 09/01/2015   Procedure: CATARACT EXTRACTION PHACO AND INTRAOCULAR LENS PLACEMENT (IOC);  Surgeon: Sallee Lange, MD;  Location: [facility] ORS;  Service: Ophthalmology;  Laterality: Right;  [place] 01:33 AP% 24.9 CDE 42.99 fluid pack lot # [id number]   CATARACT EXTRACTION W/PHACO Left 09/22/2015   Procedure: CATARACT EXTRACTION PHACO AND INTRAOCULAR LENS PLACEMENT (IOC);  Surgeon: Sallee Lange, MD;  Location: [facility] ORS;  Service: Ophthalmology;  Laterality: Left;  [place] 01:28 AP% 20.9 CDE 29.54 fluid pack lot # [id number]   CHOLECYSTECTOMY     EYE SURGERY     Orbital Decompression     TONSILLECTOMY         Family History  Problem Relation Age of Onset   Diabetes Father     Social History   Tobacco Use   Smoking status: Former   Smokeless tobacco: Never  Substance Use Topics   Alcohol use: Yes    Comment: couple glasses of wine daily   Drug use: No    Home Medications Prior to Admission medications   Medication Sig Start Date End Date Taking? Authorizing Provider  predniSONE (DELTASONE) 20 MG tablet Take 1 tablet (20 mg total) by mouth 2 (two) times daily with a meal for 5 days. 04/10/21 04/15/21 Yes  Cheryll Cockayne, MD  albuterol [facility] HFA) 108 [phone] Base) MCG/ACT inhaler Inhale 2 puffs into the lungs every 6 (six) hours as needed for wheezing or shortness of breath. 06/11/19   Johnna Acosta, NP  aspirin EC 81 MG tablet Take 81 mg by mouth daily.    [provider]  benzonatate (TESSALON) 100 MG capsule Take 1 capsule (100 mg total) by mouth 2 (two) times daily as needed for cough. 05/23/20   Theotis Burrow, NP  Bromfenac Sodium 0.075 % SOLN Apply 1 drop to eye 2 (two) times daily.    [provider]  carvedilol (COREG) 25 MG tablet Take 25 mg by mouth 2 (two) times daily with a meal.    [provider]  cephALEXin (KEFLEX) 500 MG capsule Take 500 mg by mouth every 6 (six) hours. 02/01/20    [provider]  cholecalciferol (VITAMIN D) 1000 units tablet Take 1,000 Units by mouth at bedtime.     [provider]  DALIRESP 250 MCG TABS TAKE 1 TABLET BY MOUTH DAILY 03/20/21   Freda Munro A, MD  Difluprednate 0.05 % EMUL Apply 1 drop to eye 2 (two) times daily.    [provider]  doxycycline (VIBRA-TABS) 100 MG tablet Take 1 tablet (100 mg total) by mouth 2 (two) times daily. Patient not taking: Reported on 12/09/2020 09/16/20   Theotis Burrow, NP  ergocalciferol (VITAMIN D2) 50000 units capsule Take 50,000 Units by mouth once a week. Patient not taking: Reported on 12/09/2020    [provider]  Exenatide ER 2 MG PEN Inject into the skin once a week.    [provider]  fexofenadine (ALLEGRA) 180 MG tablet Take 180 mg by mouth daily.    [provider]  furosemide (LASIX) 20 MG tablet Take 20 mg by mouth 2 (two) times daily.    [provider]  gabapentin (NEURONTIN) 600 MG tablet Take 600 mg by mouth 2 (two) times daily.    [provider]  glipiZIDE (GLUCOTROL) 5 MG tablet Take 5 mg by mouth daily before breakfast.    [provider]  ipratropium-albuterol (DUONEB) 0.5-2.5 (3) MG/3ML SOLN Take 3 mLs by nebulization every 4 (four) hours as needed. [id number] 02/05/20   Theotis Burrow, NP  JARDIANCE 10 MG TABS tablet Take 10 mg by mouth daily. 08/16/19   [provider]  levothyroxine (SYNTHROID, LEVOTHROID) 137 MCG tablet Take 137 mcg by mouth daily before breakfast.    [provider]  magnesium oxide (MAG-OX) 400 MG tablet Take 400 mg by mouth 2 (two) times daily.    [provider]  montelukast (SINGULAIR) 10 MG tablet Take 10 mg by mouth at bedtime.    [provider]  nepafenac (ILEVRO) 0.3 % ophthalmic suspension 1 drop at bedtime.     [provider]  OXYGEN Inhale into the lungs. 2 litre at night    [provider]  pravastatin (PRAVACHOL) 20 MG  tablet Take 20 mg by mouth at bedtime.    [provider]  quinapril (ACCUPRIL) 40 MG tablet Take 20 mg by mouth at bedtime.    [provider]  ranitidine (ZANTAC) 150 MG tablet Take 150 mg by mouth at bedtime.  Patient not taking: Reported on 12/09/2020    [provider]  Travoprost, BAK Free, (TRAVATAN) 0.004 % SOLN ophthalmic solution 1 drop at bedtime.    [provider]  Dwyane Luo 100-62.5-25 MCG/INH AEPB Inhale  1 puff into the lungs daily. 02/19/21   Sallyanne Kuster, NP    Allergies    Lodine [etodolac] and Cefaclor  Review of Systems   Review of Systems  Constitutional:  Negative for fever.  HENT:  Negative for ear pain and sore throat.   Eyes:  Negative for pain.  Respiratory:  Positive for shortness of breath. Negative for cough.   Cardiovascular:  Negative for chest pain.  Gastrointestinal:  Negative for abdominal pain.  Genitourinary:  Negative for flank pain.  Musculoskeletal:  Negative for back pain.  Skin:  Negative for color change and rash.  Neurological:  Negative for syncope.  All other systems reviewed and are negative.  Physical Exam Updated Vital Signs BP (!) 160/78   Pulse 80   Temp 98 F (36.7 C) (Oral)   Resp (!) 24   SpO2 95%   Physical Exam Constitutional:      Appearance: He is well-developed.  HENT:     Head: Normocephalic.     Nose: Nose normal.  Eyes:     Extraocular Movements: Extraocular movements intact.  Cardiovascular:     Rate and Rhythm: Normal rate.  Pulmonary:     Effort: Tachypnea present.     Breath sounds: Wheezing present.  Skin:    Coloration: Skin is not jaundiced.  Neurological:     Mental Status: He is alert. Mental status is at baseline.    ED Results / Procedures / Treatments   Labs (all labs ordered are listed, but only abnormal results are displayed) Labs Reviewed  CBC WITH DIFFERENTIAL/PLATELET - Abnormal; Notable for the following components:      Result Value    RBC 3.82 (*)    Hemoglobin 12.9 (*)    MCV 103.7 (*)    All other components within normal limits  COMPREHENSIVE METABOLIC PANEL - Abnormal; Notable for the following components:   Sodium 134 (*)    Chloride 93 (*)    CO2 34 (*)    Glucose, Bld 145 (*)    BUN 29 (*)    Creatinine, Ser 1.50 (*)    AST 9 (*)    Total Bilirubin 1.4 (*)    GFR, Estimated 46 (*)    All other components within normal limits  BRAIN NATRIURETIC PEPTIDE - Abnormal; Notable for the following components:   B Natriuretic Peptide 177.5 (*)    All other components within normal limits  RESP PANEL BY RT-PCR (FLU A&B, COVID) [id number]  CULTURE, BLOOD (ROUTINE X 2)  CULTURE, BLOOD (ROUTINE X 2)  LACTIC ACID, PLASMA  TROPONIN I (HIGH SENSITIVITY)    EKG None  Radiology DG Chest Port 1 View  Result Date: 04/10/2021 CLINICAL DATA:  Retaining fluid and history of COPD. EXAM: PORTABLE CHEST 1 VIEW COMPARISON:  February 08, 2018 FINDINGS: The lungs are hyperinflated. Moderate severity diffusely increased interstitial lung markings are noted. This is increased in severity when compared to the prior exam. Very mild atelectasis is seen within the bilateral lung bases. There is no evidence of a pleural effusion or pneumothorax. The heart size and mediastinal contours are within normal limits. Multilevel degenerative changes seen throughout the thoracic spine. IMPRESSION: Findings consistent with chronic interstitial lung disease, with a mild amount of superimposed interstitial edema. Electronically Signed   By: Aram Candela M.D.   On: 04/10/2021 18:30    Procedures .Critical Care Performed by: Cheryll Cockayne, MD Authorized by: Cheryll Cockayne, MD   Critical care provider statement:  Critical care time (minutes):  40   Critical care time was exclusive of:  Separately billable procedures and treating other patients and teaching time   Critical care was necessary to treat or prevent imminent or life-threatening  deterioration of the following conditions:  Respiratory failure   Medications Ordered in ED Medications  albuterol (VENTOLIN HFA) 108 (90 Base) MCG/ACT inhaler 2 puff (2 puffs Inhalation Given 04/10/21 2019)  methylPREDNISolone sodium succinate (SOLU-MEDROL) 125 mg/2 mL injection 125 mg (125 mg Intravenous Given 04/10/21 1740)  ipratropium-albuterol (DUONEB) 0.5-2.5 (3) MG/3ML nebulizer solution 3 mL (3 mLs Nebulization Given 04/10/21 2019)  AeroChamber Plus Flo-Vu Large MISC 1 each (1 each Other Given 04/10/21 2019)  magnesium sulfate IVPB 2 g 50 mL (0 g Intravenous Stopped 04/10/21 2154)  ipratropium-albuterol (DUONEB) 0.5-2.5 (3) MG/3ML nebulizer solution 3 mL (3 mLs Nebulization Given 04/10/21 2117)  albuterol (PROVENTIL) (2.5 MG/3ML) 0.083% nebulizer solution 2.5 mg (2.5 mg Nebulization Given 04/10/21 2117)    ED Course  I have reviewed the triage vital signs and the nursing notes.  Pertinent labs & imaging results that were available during my care of the patient were reviewed by me and considered in my medical decision making (see chart for details).    MDM Rules/Calculators/A&P                           Patient with desaturation 70% on arrival which is very low.  Placed on 2 L nasal cannula now satting 93% which is appropriate.  Patient given IV Solu-Medrol labs and imaging ordered.  No evidence of infection seen on chest x-ray or blood work.  Patient given multiple breathing treatments with improvement of symptoms.  Will be discharged home, advised outpatient follow-up with his pulmonologist and palliative care physicians within the week.  Recommending immediate return if he has worsening symptoms fevers or any additional concerns.   Final Clinical Impression(s) / ED Diagnoses Final diagnoses:  COPD exacerbation (HCC)    Rx / DC Orders ED Discharge Orders          Ordered    predniSONE (DELTASONE) 20 MG tablet  2 times daily with meals        04/10/21 2210              Cheryll Cockayne, MD 04/10/21 2210

## 2021-04-13 ENCOUNTER — Ambulatory Visit: Payer: Medicare Other | Admitting: Nurse Practitioner

## 2021-04-15 ENCOUNTER — Other Ambulatory Visit: Payer: Self-pay

## 2021-04-15 DIAGNOSIS — J449 Chronic obstructive pulmonary disease, unspecified: Secondary | ICD-10-CM

## 2021-04-15 LAB — CULTURE, BLOOD (ROUTINE X 2)
Culture: NO GROWTH
Culture: NO GROWTH
Special Requests: ADEQUATE
Special Requests: ADEQUATE

## 2021-04-15 MED ORDER — ALBUTEROL SULFATE HFA 108 (90 BASE) MCG/ACT IN AERS: 2.0000 | INHALATION_SPRAY | Freq: Four times a day (QID) | RESPIRATORY_TRACT | 4 refills | Status: DC | PRN

## 2021-04-15 MED ORDER — IPRATROPIUM-ALBUTEROL 0.5-2.5 (3) MG/3ML IN SOLN: 3.0000 mL | RESPIRATORY_TRACT | 3 refills | Status: AC | PRN

## 2021-04-22 ENCOUNTER — Telehealth: Payer: Self-pay

## 2021-04-22 NOTE — Telephone Encounter (Signed)
213 pm.  Request received from [name], NP to complete a follow up call to patient after ED visit.   Phone call made to patient but no answer.  Message has been left requesting a call back.

## 2021-04-22 NOTE — Telephone Encounter (Signed)
543 pm.  Incoming call from Mr. [name].  He reports doing better since his ED visit.  Now doing breathing treatments on a regular basis and remains on continuous oxygen.  Patient states he knows his situation will not improve much and he is taking it one day at a time.  No new concerns or needs at this time.

## 2021-05-13 ENCOUNTER — Encounter (HOSPITAL_BASED_OUTPATIENT_CLINIC_OR_DEPARTMENT_OTHER): Payer: Self-pay

## 2021-05-13 ENCOUNTER — Emergency Department (HOSPITAL_BASED_OUTPATIENT_CLINIC_OR_DEPARTMENT_OTHER): Payer: Medicare Other

## 2021-05-13 ENCOUNTER — Other Ambulatory Visit: Payer: Self-pay

## 2021-05-13 ENCOUNTER — Emergency Department (HOSPITAL_BASED_OUTPATIENT_CLINIC_OR_DEPARTMENT_OTHER)
Admission: EM | Admit: 2021-05-13 | Discharge: 2021-05-13 | Disposition: A | Discharge status: Home / Self Care | Payer: Medicare Other | Attending: Emergency Medicine | Admitting: Emergency Medicine

## 2021-05-13 DIAGNOSIS — Z7951 Long term (current) use of inhaled steroids: Secondary | ICD-10-CM | POA: Diagnosis not present

## 2021-05-13 DIAGNOSIS — Z87891 Personal history of nicotine dependence: Secondary | ICD-10-CM | POA: Diagnosis not present

## 2021-05-13 DIAGNOSIS — Z79899 Other long term (current) drug therapy: Secondary | ICD-10-CM | POA: Insufficient documentation

## 2021-05-13 DIAGNOSIS — I2510 Atherosclerotic heart disease of native coronary artery without angina pectoris: Secondary | ICD-10-CM | POA: Insufficient documentation

## 2021-05-13 DIAGNOSIS — R34 Anuria and oliguria: Secondary | ICD-10-CM | POA: Diagnosis present

## 2021-05-13 DIAGNOSIS — N320 Bladder-neck obstruction: Secondary | ICD-10-CM | POA: Insufficient documentation

## 2021-05-13 DIAGNOSIS — E119 Type 2 diabetes mellitus without complications: Secondary | ICD-10-CM | POA: Diagnosis not present

## 2021-05-13 DIAGNOSIS — E039 Hypothyroidism, unspecified: Secondary | ICD-10-CM | POA: Diagnosis not present

## 2021-05-13 DIAGNOSIS — J449 Chronic obstructive pulmonary disease, unspecified: Secondary | ICD-10-CM | POA: Diagnosis not present

## 2021-05-13 DIAGNOSIS — J45909 Unspecified asthma, uncomplicated: Secondary | ICD-10-CM | POA: Diagnosis not present

## 2021-05-13 DIAGNOSIS — I110 Hypertensive heart disease with heart failure: Secondary | ICD-10-CM | POA: Insufficient documentation

## 2021-05-13 DIAGNOSIS — N179 Acute kidney failure, unspecified: Secondary | ICD-10-CM | POA: Diagnosis not present

## 2021-05-13 DIAGNOSIS — I509 Heart failure, unspecified: Secondary | ICD-10-CM | POA: Diagnosis not present

## 2021-05-13 LAB — CBC WITH DIFFERENTIAL/PLATELET
Abs Immature Granulocytes: 0.03 10*3/uL (ref 0.00–0.07)
Basophils Absolute: 0 10*3/uL (ref 0.0–0.1)
Basophils Relative: 1 %
Eosinophils Absolute: 0.2 10*3/uL (ref 0.0–0.5)
Eosinophils Relative: 3 %
HCT: 42.2 % (ref 39.0–52.0)
Hemoglobin: 14 g/dL (ref 13.0–17.0)
Immature Granulocytes: 0 %
Lymphocytes Relative: 21 %
Lymphs Abs: 1.8 10*3/uL (ref 0.7–4.0)
MCH: 33.6 pg (ref 26.0–34.0)
MCHC: 33.2 g/dL (ref 30.0–36.0)
MCV: 101.2 fL — ABNORMAL HIGH (ref 80.0–100.0)
Monocytes Absolute: 1 10*3/uL (ref 0.1–1.0)
Monocytes Relative: 12 %
Neutro Abs: 5.4 10*3/uL (ref 1.7–7.7)
Neutrophils Relative %: 63 %
Platelets: 182 10*3/uL (ref 150–400)
RBC: 4.17 MIL/uL — ABNORMAL LOW (ref 4.22–5.81)
RDW: 13.3 % (ref 11.5–15.5)
WBC: 8.5 10*3/uL (ref 4.0–10.5)
nRBC: 0 % (ref 0.0–0.2)

## 2021-05-13 LAB — BASIC METABOLIC PANEL
Anion gap: 8 (ref 5–15)
BUN: 26 mg/dL — ABNORMAL HIGH (ref 8–23)
CO2: 35 mmol/L — ABNORMAL HIGH (ref 22–32)
Calcium: 9.8 mg/dL (ref 8.9–10.3)
Chloride: 91 mmol/L — ABNORMAL LOW (ref 98–111)
Creatinine, Ser: 2.17 mg/dL — ABNORMAL HIGH (ref 0.61–1.24)
GFR, Estimated: 29 mL/min — ABNORMAL LOW (ref >60)
Glucose, Bld: 103 mg/dL — ABNORMAL HIGH (ref 70–99)
Potassium: 3.9 mmol/L (ref 3.5–5.1)
Sodium: 134 mmol/L — ABNORMAL LOW (ref 135–145)

## 2021-05-13 LAB — URINALYSIS, ROUTINE W REFLEX MICROSCOPIC
Bilirubin Urine: NEGATIVE
Glucose, UA: >1000 mg/dL — AB
Hgb urine dipstick: NEGATIVE
Ketones, ur: NEGATIVE mg/dL
Leukocytes,Ua: NEGATIVE
Nitrite: NEGATIVE
Protein, ur: NEGATIVE mg/dL
Specific Gravity, Urine: 1.009 (ref 1.005–1.030)
pH: 5.5 (ref 5.0–8.0)

## 2021-05-13 MED ORDER — TAMSULOSIN HCL 0.4 MG PO CAPS: 0.4000 mg | ORAL_CAPSULE | Freq: Every day | ORAL | 0 refills | Status: DC

## 2021-05-13 MED ORDER — SODIUM CHLORIDE 0.9 % IV BOLUS
1000.0000 mL | Freq: Once | INTRAVENOUS | Status: AC
  Administered 2021-05-13: 1000 mL via INTRAVENOUS

## 2021-05-13 NOTE — ED Notes (Signed)
RN provided AVS using Teachback Method. Patient verbalizes understanding of Discharge Instructions. Opportunity for Questioning and Answers were provided by RN. Patient Discharged from ED ambulatory to Home with Family. ? ?

## 2021-05-13 NOTE — ED Provider Notes (Signed)
[facility] EMERGENCY DEPT Provider Note   CSN: [id number] Arrival date & time: 05/13/21  1431     History Chief Complaint  Patient presents with   Anuria    [name] is a 85 y.o. male.  Patient is an 85 year old male with a history of COPD on chronic oxygen, CHF, diabetes, GERD, hypertension, PVD who is presenting today with a complaint of not urinating.  Patient reports that for the last 36 hours he has not urinated.  He denies any abdominal pain, the urge to urinate, dysuria, freqency or leaking of urine.  He has been eating and drinking normally.  He did increase his 20 mg Lasix to 40 mg over the last 2 days due to not being able to urinate.  He otherwise reports that he feels fine.  He has not noticed new swelling in his legs, worsening shortness of breath or chest pain.  He did report that his doctor told him he had mild enlargement of his prostate but has never had issues in the past.  Denies any recent medication changes.  He did report that once he got here he started getting the urge to urinate and was able to urinate here but still denies any dysuria with urination.  The history is provided by the patient and medical records.      Past Medical History:  Diagnosis Date   Arthritis    Asthma    CHF (congestive heart failure) (HCC)    COPD (chronic obstructive pulmonary disease) (HCC)    Diabetes mellitus without complication (HCC)    GERD (gastroesophageal reflux disease)    H/O Graves' disease    H/O wheezing    HOH (hard of hearing)    Hypercholesterolemia    Hypertension    Hypothyroidism    Lower extremity edema    Multiple allergies    Palpitations    Peripheral vascular disease (HCC)    swelling of feet and legs   Pneumonia    in the past   PONV (postoperative nausea and vomiting)    Shortness of breath dyspnea    Sleep apnea    CPAP    Patient Active Problem List   Diagnosis Date Noted   Chronic respiratory failure with hypoxia  ([place]) 12/13/2020   Chronic obstructive pulmonary disease ([place]) 12/13/2020   HYPONATREMIA, CHRONIC 03/12/2007   PERIPHERAL NEUROPATHY 03/12/2007   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   HX, PERSONAL, TOBACCO USE 03/12/2007   HYPOTHYROIDISM 03/09/2007   DIABETES MELLITUS, TYPE II 03/09/2007   HYPERLIPIDEMIA 03/09/2007   HYPERTENSION 03/09/2007   BENIGN PROSTATIC HYPERTROPHY 03/09/2007    Past Surgical History:  Procedure Laterality Date   CARDIAC CATHETERIZATION     CATARACT EXTRACTION W/PHACO Right 09/01/2015   Procedure: CATARACT EXTRACTION PHACO AND INTRAOCULAR LENS PLACEMENT ([place]);  Surgeon: Estill Cotta, MD;  Location: [facility] ORS;  Service: Ophthalmology;  Laterality: Right;  [place] 01:33 AP% 24.9 CDE 42.99 fluid pack lot # [id number]   CATARACT EXTRACTION W/PHACO Left 09/22/2015   Procedure: CATARACT EXTRACTION PHACO AND INTRAOCULAR LENS PLACEMENT ([facility]);  Surgeon: Estill Cotta, MD;  Location: [facility] ORS;  Service: Ophthalmology;  Laterality: Left;  [place] 01:28 AP% 20.9 CDE 29.54 fluid pack lot # [id number]   CHOLECYSTECTOMY     EYE SURGERY     Orbital Decompression     TONSILLECTOMY         Family History  Problem Relation Age of Onset   Diabetes Father     Social History   Tobacco  Use   Smoking status: Former   Smokeless tobacco: Never  Substance Use Topics   Alcohol use: Yes    Comment: couple glasses of wine daily   Drug use: No    Home Medications Prior to Admission medications   Medication Sig Start Date End Date Taking? Authorizing Provider  albuterol (PROAIR HFA) 108 (90 Base) MCG/ACT inhaler Inhale 2 puffs into the lungs every 6 (six) hours as needed for wheezing or shortness of breath. 04/15/21   Jonetta Osgood, NP  aspirin EC 81 MG tablet Take 81 mg by mouth daily.    [provider]  benzonatate (TESSALON) 100 MG capsule Take 1 capsule (100 mg total) by mouth 2 (two) times daily as needed for cough. 05/23/20   Luiz Ochoa, NP  Bromfenac Sodium  0.075 % SOLN Apply 1 drop to eye 2 (two) times daily.    [provider]  carvedilol (COREG) 25 MG tablet Take 25 mg by mouth 2 (two) times daily with a meal.    [provider]  cephALEXin (KEFLEX) 500 MG capsule Take 500 mg by mouth every 6 (six) hours. 02/01/20   [provider]  cholecalciferol (VITAMIN D) 1000 units tablet Take 1,000 Units by mouth at bedtime.     [provider]  DALIRESP 250 MCG TABS TAKE 1 TABLET BY MOUTH DAILY 03/20/21   Devona Konig A, MD  Difluprednate 0.05 % EMUL Apply 1 drop to eye 2 (two) times daily.    [provider]  doxycycline (VIBRA-TABS) 100 MG tablet Take 1 tablet (100 mg total) by mouth 2 (two) times daily. Patient not taking: Reported on 12/09/2020 09/16/20   Luiz Ochoa, NP  ergocalciferol (VITAMIN D2) 50000 units capsule Take 50,000 Units by mouth once a week. Patient not taking: Reported on 12/09/2020    [provider]  Exenatide ER 2 MG PEN Inject into the skin once a week.    [provider]  fexofenadine (ALLEGRA) 180 MG tablet Take 180 mg by mouth daily.    [provider]  furosemide (LASIX) 20 MG tablet Take 20 mg by mouth 2 (two) times daily.    [provider]  gabapentin (NEURONTIN) 600 MG tablet Take 600 mg by mouth 2 (two) times daily.    [provider]  glipiZIDE (GLUCOTROL) 5 MG tablet Take 5 mg by mouth daily before breakfast.    [provider]  ipratropium-albuterol (DUONEB) 0.5-2.5 (3) MG/3ML SOLN Take 3 mLs by nebulization every 4 (four) hours as needed. [id number] 04/15/21   Jonetta Osgood, NP  JARDIANCE 10 MG TABS tablet Take 10 mg by mouth daily. 08/16/19   [provider]  levothyroxine (SYNTHROID, LEVOTHROID) 137 MCG tablet Take 137 mcg by mouth daily before breakfast.    [provider]  magnesium oxide (MAG-OX) 400 MG tablet Take 400 mg by mouth 2 (two) times daily.    [provider]  montelukast  (SINGULAIR) 10 MG tablet Take 10 mg by mouth at bedtime.    [provider]  nepafenac (ILEVRO) 0.3 % ophthalmic suspension 1 drop at bedtime.     [provider]  OXYGEN Inhale into the lungs. 2 litre at night    [provider]  pravastatin (PRAVACHOL) 20 MG tablet Take 20 mg by mouth at bedtime.    [provider]  quinapril (ACCUPRIL) 40 MG tablet Take 20 mg by mouth at bedtime.    [provider]  ranitidine (ZANTAC) 150 MG  tablet Take 150 mg by mouth at bedtime.  Patient not taking: Reported on 12/09/2020    [provider]  Travoprost, BAK Free, (TRAVATAN) 0.004 % SOLN ophthalmic solution 1 drop at bedtime.    [provider]  TRELEGY ELLIPTA 100-62.5-25 MCG/INH AEPB Inhale 1 puff into the lungs daily. 02/19/21   Sallyanne Kuster, NP    Allergies    Lodine [etodolac] and Cefaclor  Review of Systems   Review of Systems  All other systems reviewed and are negative.  Physical Exam Updated Vital Signs BP (!) 161/117 (BP Location: Right Arm)   Pulse 67   Temp 98 F (36.7 C)   Resp (!) 26   Ht 5\' 10"  (1.778 m)   Wt 118.2 kg   SpO2 91%   BMI 37.39 kg/m   Physical Exam Vitals and nursing note reviewed.  Constitutional:      General: He is not in acute distress.    Appearance: He is well-developed. He is ill-appearing.     Comments: Chronically ill-appearing 85 year old male  HENT:     Head: Normocephalic and atraumatic.     Mouth/Throat:     Mouth: Mucous membranes are moist.  Eyes:     Conjunctiva/sclera: Conjunctivae normal.     Pupils: Pupils are equal, round, and reactive to light.  Cardiovascular:     Rate and Rhythm: Normal rate and regular rhythm.     Pulses: Normal pulses.     Heart sounds: No murmur heard. Pulmonary:     Effort: Pulmonary effort is normal. No respiratory distress.     Breath sounds: Normal breath sounds. No wheezing or rales.  Abdominal:     General: There is no distension.      Palpations: Abdomen is soft.     Tenderness: There is no abdominal tenderness. There is no right CVA tenderness, left CVA tenderness, guarding or rebound.  Musculoskeletal:        General: No tenderness. Normal range of motion.     Cervical back: Normal range of motion and neck supple.     Comments: Compression socks are on and trace edema in bilateral ankles  Skin:    General: Skin is warm and dry.     Findings: No erythema or rash.  Neurological:     Mental Status: He is alert and oriented to person, place, and time. Mental status is at baseline.  Psychiatric:        Mood and Affect: Mood normal.        Behavior: Behavior normal.    ED Results / Procedures / Treatments   Labs (all labs ordered are listed, but only abnormal results are displayed) Labs Reviewed  CBC WITH DIFFERENTIAL/PLATELET - Abnormal; Notable for the following components:      Result Value   RBC 4.17 (*)    MCV 101.2 (*)    All other components within normal limits  URINALYSIS, ROUTINE W REFLEX MICROSCOPIC - Abnormal; Notable for the following components:   Glucose, UA >1,000 (*)    All other components within normal limits  BASIC METABOLIC PANEL    EKG None  Radiology CT Renal Stone Study  Result Date: 05/13/2021 CLINICAL DATA:  Bladder neck obstruction.  Unable to urinate. EXAM: CT ABDOMEN AND PELVIS WITHOUT CONTRAST TECHNIQUE: Multidetector CT imaging of the abdomen and pelvis was performed following the standard protocol without IV contrast. COMPARISON:  None. FINDINGS: Lower chest: No acute abnormality. Hepatobiliary: No focal liver abnormality is seen. Status post cholecystectomy. No  biliary dilatation. Pancreas: Small rounded density adjacent to the tail the pancreas is favored as a splenule. Pancreas appears within normal limits. Spleen: Normal in size without focal abnormality. Adrenals/Urinary Tract: There is a single punctate nonobstructing left renal calculus. Otherwise, the left kidney is within  normal limits. Within the central left kidney there is a 9.8 x 5.3 x 8.6 cm cystic area. This may represent a large peripelvic cysts. No definite hydronephrosis or urinary tract calculus. Bladder is distended, but otherwise within normal limits. The adrenal glands are within normal limits. Stomach/Bowel: Stomach is within normal limits. Appendix is not seen. No evidence of bowel wall thickening, distention, or inflammatory changes. There is sigmoid colon diverticulosis without evidence for acute diverticulitis. Vascular/Lymphatic: Aortic atherosclerosis. No enlarged abdominal or pelvic lymph nodes. Reproductive: Prostate is unremarkable. Other: There is a small fat containing left inguinal hernia. There is no ascites or free air. Within the subcutaneous tissues of the superior left buttocks there is a 3.2 x 7.6 by 3.5 cm fluid attenuation area. Musculoskeletal: Multilevel degenerative changes affect the spine. There is mild chronic compression deformity of the superior endplate of L5. IMPRESSION: 1. The bladder is distended, but otherwise appears within normal limits. 2. No hydronephrosis. 3. Large right peripelvic cyst measuring 9.8 cm. 4. Nonobstructing left renal calculus. 5. Sigmoid colon diverticulosis. 6. Subcutaneous fluid collection right buttocks, indeterminate. Findings may represent resolving hematoma or other fluid collection. 7.  Aortic Atherosclerosis (ICD10-I70.0). Electronically Signed   By: Ronney Asters M.D.   On: 05/13/2021 17:01    Procedures Procedures   Medications Ordered in ED Medications  sodium chloride 0.9 % bolus 1,000 mL (has no administration in time range)    ED Course  I have reviewed the triage vital signs and the nursing notes.  Pertinent labs & imaging results that were available during my care of the patient were reviewed by me and considered in my medical decision making (see chart for details).    MDM Rules/Calculators/A&P                           Patient is  an 85 year old male presenting today due to the complaint of significant decreased urination for the last 36 hours.  Upon arrival here he urinated for the first time in 36 hours and produced approximately 400 mL and postvoid residual was 190 mL.  Patient has no notable exam findings.  He does not look significantly fluid overloaded or dehydrated.  He has no abdominal pain and low suspicion for renal stone.  Urine is yellow and has no hematuria.  We will check to ensure no signs of infection or new acute kidney injury.  5:59 PM CT showed distended bladder but no other obstructive findings.  Suspect prostate enlargement causing bladder outlet obstruction.  Patient was unable to urinate any further and and in and out cath was done and 600 mL of urine was removed.  Discussed with patient indwelling Foley catheter until follow-up with his doctor versus starting Flomax and following up on Friday anyway for repeat creatinine and to ensure that he is emptying his bladder.  Patient does not wish to go home with a Foley at this time.  MDM   Amount and/or Complexity of Data Reviewed Clinical lab tests: ordered and reviewed Tests in the radiology section of CPT: ordered and reviewed Independent visualization of images, tracings, or specimens: yes    Final Clinical Impression(s) / ED Diagnoses Final diagnoses:  Bladder  outflow obstruction  AKI (acute kidney injury) ([place])    Rx / DC Orders ED Discharge Orders          Ordered    tamsulosin (FLOMAX) 0.4 MG CAPS capsule  Daily after supper        05/13/21 Tamela Oddi, MD 05/13/21 1801

## 2021-05-13 NOTE — Discharge Instructions (Signed)
Looks like you are holding more urine in your bladder than you should probably related to your prostate.  Only take your normal dose of Lasix, start the Flomax.  If you start having significant abdominal pain or inability to urinate return to the emergency room.

## 2021-05-13 NOTE — ED Triage Notes (Signed)
He tells me he feels "fine, I have COPD" (he is on his home O2 per cannula). His only complaint is of not feeling the urge to urinate, with no urination x 2 full days. He denies any pain, and specifically denies any pelvic/bladder area pain. He denies fever, nor any other sign of current illness.

## 2021-05-20 ENCOUNTER — Telehealth: Payer: Self-pay | Admitting: Nurse Practitioner

## 2021-05-20 NOTE — Telephone Encounter (Signed)
[name] contacted concerning increased dose of Trazodone, refilled current dose though at the time of the discussion plan was to increase Trazodone to 100mg  qhs. Will d/c Trazodone 50mg  qhs and start Trazodone 100mg  qhs; will send rx Rx: Trazodone 100mg  qhs; #90; No RF escribed

## 2021-05-25 ENCOUNTER — Other Ambulatory Visit: Payer: Self-pay | Admitting: Internal Medicine

## 2021-05-25 DIAGNOSIS — J449 Chronic obstructive pulmonary disease, unspecified: Secondary | ICD-10-CM

## 2021-06-30 ENCOUNTER — Telehealth: Payer: Self-pay | Admitting: Nurse Practitioner

## 2021-06-30 NOTE — Telephone Encounter (Signed)
I called Mr [name] to schedule f/u pc visit, no answer, message left with contact information °

## 2021-07-15 ENCOUNTER — Telehealth: Payer: Self-pay

## 2021-07-15 NOTE — Telephone Encounter (Signed)
252 pm.  Phone call made to patient to complete a phone check-in and offer a home visit.  No answer.  Message left on VM requesting a call back.

## 2021-07-29 ENCOUNTER — Telehealth: Payer: Self-pay | Admitting: Nurse Practitioner

## 2021-07-29 NOTE — Telephone Encounter (Signed)
I called [name] to schedule f/u pc visit, no answer, message left with contact information

## 2021-07-30 ENCOUNTER — Encounter: Payer: Self-pay | Admitting: Nurse Practitioner

## 2021-07-30 ENCOUNTER — Other Ambulatory Visit: Payer: Self-pay

## 2021-07-30 ENCOUNTER — Other Ambulatory Visit: Payer: Medicare Other | Admitting: Nurse Practitioner

## 2021-07-30 DIAGNOSIS — R5381 Other malaise: Secondary | ICD-10-CM

## 2021-07-30 DIAGNOSIS — G4701 Insomnia due to medical condition: Secondary | ICD-10-CM

## 2021-07-30 DIAGNOSIS — Z515 Encounter for palliative care: Secondary | ICD-10-CM

## 2021-07-30 DIAGNOSIS — R0602 Shortness of breath: Secondary | ICD-10-CM

## 2021-07-30 DIAGNOSIS — J449 Chronic obstructive pulmonary disease, unspecified: Secondary | ICD-10-CM

## 2021-07-30 DIAGNOSIS — G8929 Other chronic pain: Secondary | ICD-10-CM

## 2021-07-30 NOTE — Progress Notes (Signed)
Therapist, nutritional Palliative Care Consult Note Telephone: [phone]  Fax: [phone]    Date of encounter: 07/30/21 10:42 PM PATIENT NAME: [name] [address] [place] [address]   [phone] (home)  DOB: 1935/11/26 MRN: [id number] PRIMARY CARE PROVIDER:    Alan Mulder, MD,  [address] [place] [address] [phone]  RESPONSIBLE PARTY:    Contact Information     Name Relation Home Work Mobile   [name], [name] [phone]        Due to the COVID-19 crisis, this visit was done via telemedicine from my office and it was initiated and consent by this patient and or family.  I connected with  [name] OR PROXY on 07/30/21 by a telephone as video not available enabled telemedicine application and verified that I am speaking with the correct person.   I discussed the limitations of evaluation and management by telemedicine. The patient expressed understanding and agreed to proceed.  Palliative Care was asked to follow this patient by consultation request of  Morayati, Delsa Sale, MD to address advance care planning and complex medical decision making. This is a follow up visit.                                  ASSESSMENT AND PLAN / RECOMMENDATIONS:  Symptom Management/Plan: 1. ACP; DNR/MOST form in vynca; sent to [facility] for further evaluation of shortness of breath   2. Shortness of breath with debility secondary COPD, improved, currently stable; continue to monitor respiratory status, inhalation therapy, supplemental O2, continue trilogy;    3. Palliative care encounter; Palliative care encounter; Palliative medicine team will continue to support patient, patient's family, and medical team. Visit consisted of counseling and education dealing with the complex and emotionally intense issues of symptom management and palliative care in the setting of serious and potentially life-threatening illness    4. Debility secondary to COPD; CHF; continue to encourage energy conservation; mobility when feeling well.    5. Pain chronic secondary to OA; discussed at length, about going to [facility] where he has been seen before, will go for appointment for injection which has helped previously. We talked about mobiity.    6. Insomnia, improved with increase dose of trazodone. discussed sleep, sleep patterns, sleep hygiene. Discussed melatonin. We talked about trazodone. [name] in agreement to continue trazodone as has been effective; Will refill  Rx: Trazodone 50mg  qhs; #90; 1RF  Follow up Palliative Care Visit: Palliative care will continue to follow for complex medical decision making, advance care planning, and clarification of goals. Return 8 weeks or prn.  I spent 46 minutes providing this consultation. More than 50% of the time in this consultation was spent in counseling and care coordination  PPS: 40%  Chief Complaint: Follow up palliative consult for complex medical decision making  HISTORY OF PRESENT ILLNESS:  [name] is a 86 y.o. 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 On 2L continuous oxygen, trilogy. 91   History obtained from review of EMR, discussion with [name]. 86 I reviewed available labs, medications, imaging, studies and related documents from the EMR.  Records  reviewed and summarized above.   ROS 10 point system reviewed with [name] 86 all negative except HPI  Physical Exam: deferred  Thank you for the opportunity to participate in the care of [name].  The palliative care team will continue to follow. 86 Please  call our office at [phone] if we can be of additional assistance.   Aaron Drone [place], NP

## 2021-08-05 ENCOUNTER — Telehealth: Payer: Self-pay | Admitting: Nurse Practitioner

## 2021-08-05 NOTE — Telephone Encounter (Signed)
[name] called and left a message with fever yesterday with a lot of mucous. Today no fever, slight sore throat. [name] endorses he took one amoxicillin. We talked about symptoms and concern for PNA, discussed and recommended [name] to schedule visit with Dr Patrecia Pace, or urgent care with clinical presentation, will likely need a chest xray for best treatment. Discussed at length about concerns with chronic disease, symptoms and fever of 101. [name] was in agreement, emotional support provided ? ?Total time 20 minutes ?Documentation 5 minutes ?Phone discussion 15 minutes ?

## 2021-08-12 ENCOUNTER — Telehealth: Payer: Self-pay

## 2021-08-12 NOTE — Telephone Encounter (Signed)
125 pm.  Phone call made to complete a check-in at the request of [name], NP.  No answer.  Message left requesting a call back.  ?

## 2021-09-25 ENCOUNTER — Other Ambulatory Visit: Payer: Self-pay

## 2021-09-25 MED ORDER — AZITHROMYCIN 250 MG PO TABS: ORAL_TABLET | ORAL | 0 refills | Status: DC

## 2021-09-25 NOTE — Telephone Encounter (Signed)
Pt called and advised that he has a lot of mucus that started on Tuesday 09/22/21.  It has changed to a yellow color and pt is requesting some antibiotics so it won't go into his chest.  Per Alyssa we sent in a Z-pak to his pharmacy.  Pt was informed we sent in ABX to his pharmacy ?

## 2021-10-01 ENCOUNTER — Telehealth (INDEPENDENT_AMBULATORY_CARE_PROVIDER_SITE_OTHER): Payer: Medicare Other | Admitting: Nurse Practitioner

## 2021-10-01 ENCOUNTER — Encounter: Payer: Self-pay | Admitting: Nurse Practitioner

## 2021-10-01 DIAGNOSIS — J9611 Chronic respiratory failure with hypoxia: Secondary | ICD-10-CM

## 2021-10-01 DIAGNOSIS — J449 Chronic obstructive pulmonary disease, unspecified: Secondary | ICD-10-CM

## 2021-10-01 DIAGNOSIS — J018 Other acute sinusitis: Secondary | ICD-10-CM

## 2021-10-01 MED ORDER — PREDNISONE 10 MG PO TABS: ORAL_TABLET | ORAL | 0 refills | Status: DC

## 2021-10-01 NOTE — Progress Notes (Signed)
[facility] [address] [place], [place] [address]  Internal MEDICINE  Telephone Visit  Patient Name: Lacey JensenJohn L Shave  [id number] 11, 2037  [id number]  Date of Service: 10/01/2021  I connected with the patient at 4:00 PM by telephone and verified the patients identity using two identifiers.   I discussed the limitations, risks, security and privacy concerns of performing an evaluation and management service by telephone and the availability of in person appointments. I also discussed with the patient that there may be a patient responsible charge related to the service.  The patient expressed understanding and agrees to proceed.    Chief Complaint  Patient presents with   Telephone Assessment    [phone]   Telephone Screen   Sinusitis    Having lots of thick, yellow mucus - can not get it to clear up, has already taken a z-pack    Sinusitis Associated symptoms include congestion, coughing (productive) and a sore throat. Pertinent negatives include no chills, shortness of breath or sneezing.  Ramez presents for a telehealth virtual visit for symptoms of sinusitis.  He has already taken a Z-Pak and he continues to have a lots of thick yellow mucus that he cannot get to clear up and is requesting a prednisone taper.   Current Medication: Outpatient Encounter Medications as of 10/01/2021  Medication Sig   albuterol (PROAIR HFA) 108 (90 Base) MCG/ACT inhaler Inhale 2 puffs into the lungs every 6 (six) hours as needed for wheezing or shortness of breath.   aspirin EC 81 MG tablet Take 81 mg by mouth daily.   benzonatate (TESSALON) 100 MG capsule Take 1 capsule (100 mg total) by mouth 2 (two) times daily as needed for cough.   Bromfenac Sodium 0.075 % SOLN Apply 1 drop to eye 2 (two) times daily.   carvedilol (COREG) 25 MG tablet Take 25 mg by mouth 2 (two) times daily with a meal.   cholecalciferol (VITAMIN D) 1000 units tablet Take 1,000 Units by mouth at bedtime.    Difluprednate  0.05 % EMUL Apply 1 drop to eye 2 (two) times daily.   ergocalciferol (VITAMIN D2) 50000 units capsule Take 50,000 Units by mouth once a week.   Exenatide ER 2 MG PEN Inject into the skin once a week.   fexofenadine (ALLEGRA) 180 MG tablet Take 180 mg by mouth daily.   furosemide (LASIX) 20 MG tablet Take 20 mg by mouth 2 (two) times daily.   gabapentin (NEURONTIN) 600 MG tablet Take 600 mg by mouth 2 (two) times daily.   glipiZIDE (GLUCOTROL) 5 MG tablet Take 5 mg by mouth daily before breakfast.   ipratropium-albuterol (DUONEB) 0.5-2.5 (3) MG/3ML SOLN Take 3 mLs by nebulization every 4 (four) hours as needed. DX-J44.9   JARDIANCE 10 MG TABS tablet Take 10 mg by mouth daily.   levothyroxine (SYNTHROID, LEVOTHROID) 137 MCG tablet Take 137 mcg by mouth daily before breakfast.   magnesium oxide (MAG-OX) 400 MG tablet Take 400 mg by mouth 2 (two) times daily.   montelukast (SINGULAIR) 10 MG tablet Take 10 mg by mouth at bedtime.   nepafenac (ILEVRO) 0.3 % ophthalmic suspension 1 drop at bedtime.    OXYGEN Inhale into the lungs. 2 litre at night   pravastatin (PRAVACHOL) 20 MG tablet Take 20 mg by mouth at bedtime.   quinapril (ACCUPRIL) 40 MG tablet Take 20 mg by mouth at bedtime.   ranitidine (ZANTAC) 150 MG tablet Take 150 mg by mouth at bedtime.   Roflumilast  250 MCG TABS TAKE 1 TABLET BY MOUTH EVERY DAY   tamsulosin (FLOMAX) 0.4 MG CAPS capsule Take 1 capsule (0.4 mg total) by mouth daily after supper.   Travoprost, BAK Free, (TRAVATAN) 0.004 % SOLN ophthalmic solution 1 drop at bedtime.   [DISCONTINUED] azithromycin (ZITHROMAX) 250 MG tablet Take 2 tablets on day one and then on days 2-5 take one tablet   [DISCONTINUED] cephALEXin (KEFLEX) 500 MG capsule Take 500 mg by mouth every 6 (six) hours. (Patient not taking: Reported on 10/12/2021)   [DISCONTINUED] doxycycline (VIBRA-TABS) 100 MG tablet Take 1 tablet (100 mg total) by mouth 2 (two) times daily.   [DISCONTINUED] predniSONE (DELTASONE)  10 MG tablet Take one tab 3 x day for 3 days, then take one tab 2 x a day for 3 days and then take one tab a day for 3 days for copd (Patient not taking: Reported on 10/12/2021)   [DISCONTINUED] TRELEGY ELLIPTA 100-62.5-25 MCG/INH AEPB Inhale 1 puff into the lungs daily.   No facility-administered encounter medications on file as of 10/01/2021.    Surgical History: Past Surgical History:  Procedure Laterality Date   CARDIAC CATHETERIZATION     CATARACT EXTRACTION W/PHACO Right 09/01/2015   Procedure: CATARACT EXTRACTION PHACO AND INTRAOCULAR LENS PLACEMENT (IOC);  Surgeon: Sallee Lange, MD;  Location: [facility] ORS;  Service: Ophthalmology;  Laterality: Right;  [place] 01:33 AP% 24.9 CDE 42.99 fluid pack lot # [id number]   CATARACT EXTRACTION W/PHACO Left 09/22/2015   Procedure: CATARACT EXTRACTION PHACO AND INTRAOCULAR LENS PLACEMENT (IOC);  Surgeon: Sallee Lange, MD;  Location: [facility] ORS;  Service: Ophthalmology;  Laterality: Left;  [place] 01:28 AP% 20.9 CDE 29.54 fluid pack lot # [id number]   CHOLECYSTECTOMY     EYE SURGERY     Orbital Decompression     TONSILLECTOMY      Medical History: Past Medical History:  Diagnosis Date   Arthritis    Asthma    CHF (congestive heart failure) (HCC)    COPD (chronic obstructive pulmonary disease) (HCC)    Diabetes mellitus without complication (HCC)    GERD (gastroesophageal reflux disease)    H/O Graves' disease    H/O wheezing    HOH (hard of hearing)    Hypercholesterolemia    Hypertension    Hypothyroidism    Lower extremity edema    Multiple allergies    Palpitations    Peripheral vascular disease (HCC)    swelling of feet and legs   Pneumonia    in the past   PONV (postoperative nausea and vomiting)    Shortness of breath dyspnea    Sleep apnea    CPAP    Family History: Family History  Problem Relation Age of Onset   Diabetes Father     Social History   Socioeconomic History   Marital status: Married    Spouse name: Not  on file   Number of children: Not on file   Years of education: Not on file   Highest education level: Not on file  Occupational History   Not on file  Tobacco Use   Smoking status: Former   Smokeless tobacco: Never  Substance and Sexual Activity   Alcohol use: Yes    Comment: couple glasses of wine daily   Drug use: No   Sexual activity: Not on file  Other Topics Concern   Not on file  Social History Narrative   Not on file   Social Determinants of Health   Financial Resource Strain:  Not on file  Food Insecurity: Not on file  Transportation Needs: Not on file  Physical Activity: Not on file  Stress: Not on file  Social Connections: Not on file  Intimate Partner Violence: Not on file      Review of Systems  Constitutional:  Negative for chills, fatigue and unexpected weight change.  HENT:  Positive for congestion, postnasal drip, rhinorrhea and sore throat. Negative for sneezing.   Respiratory:  Positive for cough (productive). Negative for chest tightness, shortness of breath and wheezing.   Cardiovascular: Negative.  Negative for chest pain and palpitations.  Gastrointestinal:  Negative for abdominal pain, constipation, diarrhea, nausea and vomiting.  Psychiatric/Behavioral:  Negative for behavioral problems (Depression).    Vital Signs: There were no vitals taken for this visit.   Observation/Objective: Patient is alert and oriented and engages in conversation appropriately.  He does not sound as though he is in any acute distress over telephone call.    Assessment/Plan: 1. Acute non-recurrent sinusitis of other sinus Patient recently finished a Z-Pak, prednisone taper prescribed  2. Chronic respiratory failure with hypoxia [facility]) Patient is due for routine follow-up in office at her earliest convenience.    3. Chronic obstructive pulmonary disease, unspecified COPD type (HCC) Patient is due for routine follow-up in office at her earliest convenience, will  need spirometry   General Counseling: Caymen verbalizes understanding of the findings of today's phone visit and agrees with plan of treatment. I have discussed any further diagnostic evaluation that may be needed or ordered today. We also reviewed his medications today. he has been encouraged to call the office with any questions or concerns that should arise related to todays visit.  Return in about 1 month (around 10/31/2021) for F/U, pulmonary only, Chantella Creech or DSK.   No orders of the defined types were placed in this encounter.   Meds ordered this encounter  Medications   DISCONTD: predniSONE (DELTASONE) 10 MG tablet    Sig: Take one tab 3 x day for 3 days, then take one tab 2 x a day for 3 days and then take one tab a day for 3 days for copd    Dispense:  18 tablet    Refill:  0    Time spent:10 Minutes Time spent with patient included reviewing progress notes, labs, imaging studies, and discussing plan for follow up.  [place] Controlled Substance Database was reviewed by me for overdose risk score (ORS) if appropriate.  This patient was seen by Sallyanne Kuster, FNP-C in collaboration with Dr. Beverely Risen as a part of collaborative care agreement.  Kyilee Gregg R. Tedd Sias, MSN, FNP-C Internal medicine

## 2021-10-07 ENCOUNTER — Encounter: Payer: Self-pay | Admitting: Nurse Practitioner

## 2021-10-07 ENCOUNTER — Other Ambulatory Visit: Payer: Medicare Other | Admitting: Nurse Practitioner

## 2021-10-07 DIAGNOSIS — J449 Chronic obstructive pulmonary disease, unspecified: Secondary | ICD-10-CM

## 2021-10-07 DIAGNOSIS — Z515 Encounter for palliative care: Secondary | ICD-10-CM

## 2021-10-07 DIAGNOSIS — G4700 Insomnia, unspecified: Secondary | ICD-10-CM

## 2021-10-07 DIAGNOSIS — R0602 Shortness of breath: Secondary | ICD-10-CM

## 2021-10-07 NOTE — Progress Notes (Signed)
? ? ?Manufacturing engineer ?Community Palliative Care Consult Note ?Telephone: [phone]  ?Fax: [phone]  ? ? ?Date of encounter: 10/07/21 ?5:20 PM ?PATIENT NAME: [name] ?[place] [address]   ?[phone] (home)  ?DOB: 08-Jul-1935 ?MRN: [id number] ?PRIMARY CARE PROVIDER:    ?Lenard Simmer, MD,  ?[place] ?[place] [address] ?[phone] ?RESPONSIBLE PARTY:    ?Contact Information   ? ? Name Relation Home Work Mobile  ? Nikolai, Wilczak Spouse [phone]    ? ?  ? ?I met face to face with patient and family in home. Palliative Care was asked to follow this patient by consultation request of  Morayati, Lourdes Sledge, MD to address advance care planning and complex medical decision making. This is a follow up visit.                                  ?ASSESSMENT AND PLAN / RECOMMENDATIONS: ?Symptom Management/Plan: ?1. ACP; DNR/MOST form in vynca ?  ?2. Shortness of breath with debility secondary to COPD, CHF, continue to monitor respiratory status, inhalation therapy, supplemental O2, continue trilogy ? ?3. Insomnia, discussed sleep patterns, sleep hygiene, we talked about increasing trazodone with risks and benefits. Will send in rx ?Rx: trazodone 159m qhs; #90; NO RF; will f/u in 2 weeks to see if improvement (Mr HGlantzdoes 90 day supply) ? ?4. Right shoulder pain, discussed, chronic; discussed to continue with topicals until able to schedule with orthopedic for injection. Tylenol as needed ?  ?5. Palliative care encounter; Palliative care encounter; Palliative medicine team will continue to support patient, patient's family, and medical team. Visit consisted of counseling and education dealing with the complex and emotionally intense issues of symptom management and palliative care in the setting of serious and potentially life-threatening illness ?  ?Follow up Palliative Care Visit: Palliative care will continue to follow for complex medical decision making,  advance care planning, and clarification of goals. Return 6 weeks or prn. ? ?Follow up Palliative Care Visit: Palliative care will continue to follow for complex medical decision making, advance care planning, and clarification of goals. Return 2 weeks or prn. ? ?I spent 44 minutes providing this consultation. More than 50% of the time in this consultation was spent in counseling and care coordination. ? ?PPS: 50% ? ?Chief Complaint: Follow up palliative consult for complex medical decision making ? ?HISTORY OF PRESENT ILLNESS:  JLEDELL CODRINGTONis a 86y.o. 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 On 2L continuous oxygen, trilogy. I called Mr. HWurtzto confirm palliative care in person visit and covid screening which is negative. Mr. and Mrs. HGuessin their home. Mr. HCapriwas sitting in the kitchen.. We talked about how Mr. HBohanonhas been feeling. Mr. HOsleyendorses that he has been doing much better. We talked about importance of energy. We talked about shortness of breath, continuous O2 during the day although at times he can go a few hours without wearing it, keeping his O2 sats up, and trilogy he uses at night. We talked about his appetite being good. We talked about sleep pattern, he is back to sleeping in the recliner. We talked about sleep hygiene. Mr. HSmola  endorses trazodone does help, just not sleeping as long. We talked about increasing dose, Mr. Duckett in agreement.  We talked about generalized weakness. Mr. Cutsforth continues to wear compression hose, which edema has improved. Mr. Monacelli is currently wearing his compression hose. We talked about medical goals, quality of life. We talked  about role PC in poc. We talked coping strategies with debility. We talked about TV shoes he likes to watch. We talked about right shoulder pain which he is going to schedule an appointment with ortho for an injection soon. . Therapeutic listening, emotional support provided. We talked about f/u PC visit, scheduled.  ?  ?History obtained from review of EMR, discussion with wife, Lelon Frohlich and Mr. Keep. ?I reviewed available labs, medications, imaging, studies and related documents from the EMR.  Records reviewed and summarized above.  ?  ?ROS ?Full 10 system review of systems performed and negative with exception of: as per HPI. ?  ?Physical Exam: ?Constitutional: NAD ?General: frail appearing, chronically ill pleasant male ?EYES:  lids intact ?ENMT: oral mucous membranes moist ?CV: S1S2, RRR, no LE edema ?Pulmonary: fine crackle bases, no increased work of breathing, no cough, room air ?Abdomen:normo-active BS + 4 quadrants, soft  ?MSK:  moves all extremities, ambulatory ?Skin: warm and dry, no rashes or wounds on visible skin ?Neuro:  + generalized weakness,  no cognitive impairment ?Psych: non-anxious affect, A and O x 3 ?Thank you for the opportunity to participate in the care of Mr. Mitton.  The palliative care team will continue to follow. Please call our office at [phone] if we can be of additional assistance.  ? ?Marque Bango Z Maral Lampe, NP  ? ?COVID-19 PATIENT SCREENING TOOL ?Asked and negative response unless otherwise noted:  ? ?Have you had symptoms of covid, tested positive or been in contact with someone with symptoms/positive test in the past 5-10 days?NO   ?

## 2021-10-12 ENCOUNTER — Encounter: Payer: Self-pay | Admitting: Nurse Practitioner

## 2021-10-12 ENCOUNTER — Ambulatory Visit (INDEPENDENT_AMBULATORY_CARE_PROVIDER_SITE_OTHER): Payer: Medicare Other | Admitting: Nurse Practitioner

## 2021-10-12 VITALS — BP 100/62 | HR 66 | Temp 98.3°F | Resp 16 | Ht 70.5 in | Wt 237.4 lb

## 2021-10-12 DIAGNOSIS — J449 Chronic obstructive pulmonary disease, unspecified: Secondary | ICD-10-CM

## 2021-10-12 DIAGNOSIS — J9611 Chronic respiratory failure with hypoxia: Secondary | ICD-10-CM | POA: Diagnosis not present

## 2021-10-12 DIAGNOSIS — G4733 Obstructive sleep apnea (adult) (pediatric): Secondary | ICD-10-CM

## 2021-10-12 DIAGNOSIS — J018 Other acute sinusitis: Secondary | ICD-10-CM | POA: Diagnosis not present

## 2021-10-12 MED ORDER — PREDNISONE 10 MG PO TABS: 10.0000 mg | ORAL_TABLET | Freq: Every day | ORAL | 1 refills | Status: DC

## 2021-10-12 MED ORDER — TRELEGY ELLIPTA 100-62.5-25 MCG/ACT IN AEPB: 1.0000 | INHALATION_SPRAY | Freq: Every day | RESPIRATORY_TRACT | 3 refills | Status: DC

## 2021-10-12 NOTE — Progress Notes (Signed)
[facility], [place] [address]  Internal MEDICINE  Office Visit Note  Patient Name: [name]  [id number]  [id number]  Date of Service: 10/12/2021  Chief Complaint  Patient presents with   chronic respiratory failure with hypoxia    Follow up    HPI Osbaldo presents for follow-up visit for chronic respiratory failure with hypoxia, COPD, oxygen dependence, recent sinusitis.  Patient reports that he feels much better after the prednisone taper and has been able to drive and go visit a friend and has even sat with his oxygen off for 30 minutes with his oxygen level at 98 to 99% on room air.  He is wondering if he can be on a maintenance dose of prednisone to help keep the inflammation in his lungs down.  He is also on Trelegy 1 puff daily for maintenance inhaler.  He does take nebulizer treatments and has an albuterol rescue inhaler that he uses as needed when he is having cough, shortness of breath or wheezing.    Current Medication: Outpatient Encounter Medications as of 10/12/2021  Medication Sig   albuterol (PROAIR HFA) 108 (90 Base) MCG/ACT inhaler Inhale 2 puffs into the lungs every 6 (six) hours as needed for wheezing or shortness of breath.   aspirin EC 81 MG tablet Take 81 mg by mouth daily.   benzonatate (TESSALON) 100 MG capsule Take 1 capsule (100 mg total) by mouth 2 (two) times daily as needed for cough.   Bromfenac Sodium 0.075 % SOLN Apply 1 drop to eye 2 (two) times daily.   carvedilol (COREG) 25 MG tablet Take 25 mg by mouth 2 (two) times daily with a meal.   cholecalciferol (VITAMIN D) 1000 units tablet Take 1,000 Units by mouth at bedtime.    Difluprednate 0.05 % EMUL Apply 1 drop to eye 2 (two) times daily.   ergocalciferol (VITAMIN D2) 50000 units capsule Take 50,000 Units by mouth once a week.   Exenatide ER 2 MG PEN Inject into the skin once a week.   fexofenadine (ALLEGRA) 180 MG tablet Take 180 mg by mouth daily.    Fluticasone-Umeclidin-Vilant (TRELEGY ELLIPTA) 100-62.5-25 MCG/ACT AEPB Inhale 1 puff into the lungs daily.   furosemide (LASIX) 20 MG tablet Take 20 mg by mouth 2 (two) times daily.   gabapentin (NEURONTIN) 600 MG tablet Take 600 mg by mouth 2 (two) times daily.   glipiZIDE (GLUCOTROL) 5 MG tablet Take 5 mg by mouth daily before breakfast.   ipratropium-albuterol (DUONEB) 0.5-2.5 (3) MG/3ML SOLN Take 3 mLs by nebulization every 4 (four) hours as needed. [id number]   JARDIANCE 10 MG TABS tablet Take 10 mg by mouth daily.   levothyroxine (SYNTHROID, LEVOTHROID) 137 MCG tablet Take 137 mcg by mouth daily before breakfast.   magnesium oxide (MAG-OX) 400 MG tablet Take 400 mg by mouth 2 (two) times daily.   montelukast (SINGULAIR) 10 MG tablet Take 10 mg by mouth at bedtime.   nepafenac (ILEVRO) 0.3 % ophthalmic suspension 1 drop at bedtime.    OXYGEN Inhale into the lungs. 2 litre at night   pravastatin (PRAVACHOL) 20 MG tablet Take 20 mg by mouth at bedtime.   predniSONE (DELTASONE) 10 MG tablet Take 1 tablet (10 mg total) by mouth daily with breakfast.   quinapril (ACCUPRIL) 40 MG tablet Take 20 mg by mouth at bedtime.   ranitidine (ZANTAC) 150 MG tablet Take 150 mg by mouth at bedtime.   tamsulosin (FLOMAX) 0.4 MG CAPS capsule  Take 1 capsule (0.4 mg total) by mouth daily after supper.   Travoprost, BAK Free, (TRAVATAN) 0.004 % SOLN ophthalmic solution 1 drop at bedtime.   [DISCONTINUED] doxycycline (VIBRA-TABS) 100 MG tablet Take 1 tablet (100 mg total) by mouth 2 (two) times daily.   [DISCONTINUED] Roflumilast 250 MCG TABS TAKE 1 TABLET BY MOUTH EVERY DAY   [DISCONTINUED] TRELEGY ELLIPTA 100-62.5-25 MCG/INH AEPB Inhale 1 puff into the lungs daily.   [DISCONTINUED] cephALEXin (KEFLEX) 500 MG capsule Take 500 mg by mouth every 6 (six) hours. (Patient not taking: Reported on 10/12/2021)   [DISCONTINUED] predniSONE (DELTASONE) 10 MG tablet Take one tab 3 x day for 3 days, then take one tab 2 x a day  for 3 days and then take one tab a day for 3 days for copd (Patient not taking: Reported on 10/12/2021)   No facility-administered encounter medications on file as of 10/12/2021.    Surgical History: Past Surgical History:  Procedure Laterality Date   CARDIAC CATHETERIZATION     CATARACT EXTRACTION W/PHACO Right 09/01/2015   Procedure: CATARACT EXTRACTION PHACO AND INTRAOCULAR LENS PLACEMENT (IOC);  Surgeon: Estill Cotta, MD;  Location: [facility] ORS;  Service: Ophthalmology;  Laterality: Right;  [place] 01:33 AP% 24.9 CDE 42.99 fluid pack lot # [id number]   CATARACT EXTRACTION W/PHACO Left 09/22/2015   Procedure: CATARACT EXTRACTION PHACO AND INTRAOCULAR LENS PLACEMENT (IOC);  Surgeon: Estill Cotta, MD;  Location: [facility] ORS;  Service: Ophthalmology;  Laterality: Left;  [place] 01:28 AP% 20.9 CDE 29.54 fluid pack lot # [id number]   CHOLECYSTECTOMY     EYE SURGERY     Orbital Decompression     TONSILLECTOMY      Medical History: Past Medical History:  Diagnosis Date   Arthritis    Asthma    CHF (congestive heart failure) (HCC)    COPD (chronic obstructive pulmonary disease) (HCC)    Diabetes mellitus without complication (HCC)    GERD (gastroesophageal reflux disease)    H/O Graves' disease    H/O wheezing    HOH (hard of hearing)    Hypercholesterolemia    Hypertension    Hypothyroidism    Lower extremity edema    Multiple allergies    Palpitations    Peripheral vascular disease (HCC)    swelling of feet and legs   Pneumonia    in the past   PONV (postoperative nausea and vomiting)    Shortness of breath dyspnea    Sleep apnea    CPAP    Family History: Family History  Problem Relation Age of Onset   Diabetes Father     Social History   Socioeconomic History   Marital status: Married    Spouse name: Not on file   Number of children: Not on file   Years of education: Not on file   Highest education level: Not on file  Occupational History   Not on file  Tobacco Use    Smoking status: Former   Smokeless tobacco: Never  Substance and Sexual Activity   Alcohol use: Yes    Comment: couple glasses of wine daily   Drug use: No   Sexual activity: Not on file  Other Topics Concern   Not on file  Social History Narrative   Not on file   Social Determinants of Health   Financial Resource Strain: Not on file  Food Insecurity: Not on file  Transportation Needs: Not on file  Physical Activity: Not on file  Stress: Not on file  Social Connections: Not on file  Intimate Partner Violence: Not on file      Review of Systems  Constitutional:  Positive for fatigue. Negative for appetite change, chills, fever and unexpected weight change.  HENT: Negative.    Eyes: Negative.   Respiratory:  Positive for shortness of breath and wheezing. Negative for cough and chest tightness.   Cardiovascular:  Negative for chest pain.  Gastrointestinal:  Negative for abdominal pain, constipation, diarrhea, nausea and vomiting.  Musculoskeletal:  Positive for arthralgias and gait problem.  Neurological:  Negative for dizziness, light-headedness and headaches.  Psychiatric/Behavioral:  Negative for behavioral problems, self-injury and suicidal ideas. The patient is not nervous/anxious.     Vital Signs: BP 100/62   Pulse 66   Temp 98.3 F (36.8 C)   Resp 16   Ht 5' 10.5" (1.791 m)   Wt 237 lb 6.4 oz (107.7 kg)   SpO2 95% Comment: 2 liters oxygen  BMI 33.58 kg/m    Physical Exam Vitals reviewed.  Constitutional:      General: He is not in acute distress.    Appearance: Normal appearance. He is obese. He is not ill-appearing.  HENT:     Head: Normocephalic and atraumatic.  Eyes:     Pupils: Pupils are equal, round, and reactive to light.  Cardiovascular:     Rate and Rhythm: Normal rate and regular rhythm.     Pulses: Normal pulses.     Heart sounds: Normal heart sounds. No murmur heard. Pulmonary:     Effort: Pulmonary effort is normal. No respiratory  distress.     Breath sounds: Normal breath sounds. No wheezing.  Neurological:     Mental Status: He is alert and oriented to person, place, and time.     Motor: No weakness.     Gait: Gait abnormal.  Psychiatric:        Mood and Affect: Mood normal.        Behavior: Behavior normal.        Assessment/Plan: 1. Chronic respiratory failure with hypoxia (HCC) Continue using trelegy inhaler daily - Fluticasone-Umeclidin-Vilant (TRELEGY ELLIPTA) 100-62.5-25 MCG/ACT AEPB; Inhale 1 puff into the lungs daily.  Dispense: 180 each; Refill: 3  2. OSA treated with BiPAP Doing well, using the machine every night, does not need any supplies right now.   3. Chronic obstructive pulmonary disease, unspecified COPD type ([facility]) See problem #1 - Fluticasone-Umeclidin-Vilant (TRELEGY ELLIPTA) 100-62.5-25 MCG/ACT AEPB; Inhale 1 puff into the lungs daily.  Dispense: 180 each; Refill: 3  4. Acute non-recurrent sinusitis of other sinus Prednisone refill - predniSONE (DELTASONE) 10 MG tablet; Take 1 tablet (10 mg total) by mouth daily with breakfast.  Dispense: 90 tablet; Refill: 1   General Counseling: Harlo verbalizes understanding of the findings of todays visit and agrees with plan of treatment. I have discussed any further diagnostic evaluation that may be needed or ordered today. We also reviewed his medications today. he has been encouraged to call the office with any questions or concerns that should arise related to todays visit.    No orders of the defined types were placed in this encounter.   Meds ordered this encounter  Medications   predniSONE (DELTASONE) 10 MG tablet    Sig: Take 1 tablet (10 mg total) by mouth daily with breakfast.    Dispense:  90 tablet    Refill:  1   Fluticasone-Umeclidin-Vilant (TRELEGY ELLIPTA) 100-62.5-25 MCG/ACT AEPB    Sig: Inhale 1 puff into the lungs  daily.    Dispense:  180 each    Refill:  3    Please give patient 90 day supply.    Return in about  2 months (around 12/12/2021) for F/U, pulmonary/sleep, Brianny Soulliere .   Total time spent:30 Minutes Time spent includes review of chart, medications, test results, and follow up plan with the patient.   [place] Controlled Substance Database was reviewed by me.  This patient was seen by Jonetta Osgood, FNP-C in collaboration with Dr. Clayborn Bigness as a part of collaborative care agreement.   Narciso Stoutenburg R. Valetta Fuller, MSN, FNP-C Internal medicine

## 2021-10-27 ENCOUNTER — Encounter: Payer: Self-pay | Admitting: Nurse Practitioner

## 2021-10-27 ENCOUNTER — Other Ambulatory Visit: Payer: Medicare Other | Admitting: Nurse Practitioner

## 2021-10-27 DIAGNOSIS — G4700 Insomnia, unspecified: Secondary | ICD-10-CM

## 2021-10-27 DIAGNOSIS — Z515 Encounter for palliative care: Secondary | ICD-10-CM

## 2021-10-27 NOTE — Progress Notes (Signed)
Therapist, nutritional Palliative Care Consult Note Telephone: [phone]  Fax: [phone]    Date of encounter: 10/27/21 3:19 PM PATIENT NAME: [name] [address] [place] [address]   [phone] (home)  DOB: 20-Dec-1935 MRN: [id number] PRIMARY CARE PROVIDER:    Alan Mulder, MD,  [address] [place] [address] [phone] RESPONSIBLE PARTY:    Contact Information     Name Relation Home Work Mobile   [name], [name] [phone]        Due to the COVID-19 crisis, this visit was done via telemedicine from my office and it was initiated and consent by this patient and or family. I connected with  [name] OR PROXY on 10/27/21 by a telephonic as video not available enabled telemedicine application and verified that I am speaking with the correct person using two identifiers.   I discussed the limitations of evaluation and management by telemedicine. The patient expressed understanding and agreed to proceed.  Palliative Care was asked to follow this patient by consultation request of  [name], Aaron Sale, MD to address advance care planning and complex medical decision making. This is a follow up visit.                                  ASSESSMENT AND PLAN / RECOMMENDATIONS:  Symptom Management/Plan: 1. ACP; DNR/MOST form in vynca   2. Shortness of breath with debility secondary to COPD, CHF, continue to monitor respiratory status, inhalation therapy, supplemental O2, continue trilogy   3. Insomnia, discussed sleep patterns, sleep hygiene, we talked trazodone with [name] endorses he is not sure if increase in dose is helping. [name] endorses he continues to sleep in the recliner and often awaken by noise in the home. [name] endorses he is going to try to get an injection so he can start possible sleeping in his bed where it is quiet. Discussed can continue current dose trazodone    4. Right shoulder pain,  discussed, chronic; discussed to continue with topicals. [name] endorses he will call to schedule with orthopedic for injection. Tylenol as needed   5. Palliative care encounter; Palliative care encounter; Palliative medicine team will continue to support patient, patient's family, and medical team. Visit consisted of counseling and education dealing with the complex and emotionally intense issues of symptom management and palliative care in the setting of serious and potentially life-threatening illness  Follow up Palliative Care Visit: Palliative care will continue to follow for complex medical decision making, advance care planning, and clarification of goals. Return 8 weeks or prn.  I spent 27 minutes providing this consultation. More than 50% of the time in this consultation was spent in counseling and care coordination. PPS: 50% Chief Complaint: Follow up palliative consult for complex medical decision making HISTORY OF PRESENT ILLNESS:  [name] is a 86 y.o. 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 On 2L continuous oxygen, trilogy, insomnia. I called [name] for telephonic telemedicine as video not available for f/u telemedicine visit for insomnia, with increase dose of trazodone at last [name] visit. We talked about sleep patterns, trazodone, location where he sleeps, sleep  hygiene. We talked about ros, symptoms. We talked about how he has been feeling. [name] endorses doing very well, actually went fishing, he was very pleased. We talked about medical goals, quality of life. We talked about increase in functional abilities. We talked about  pain, scheduling f/u visit with orthopedic for injection, shoulder. We talked about f/u pc visit, scheduled. Therapeutic listening, emotional support provided, questions answered.    History obtained from review of EMR, discussion with  [name].  I reviewed available labs, medications, imaging, studies and related documents from the EMR.  Records reviewed and summarized above.   ROS 10 point system reviewed all negative except HPI  Physical Exam: deferred Thank you for the opportunity to participate in the care of [name].  The palliative care team will continue to follow. Please call our office at [phone] if we can be of additional assistance.   [name] Aaron Rome, NP

## 2021-11-08 ENCOUNTER — Encounter: Payer: Self-pay | Admitting: Nurse Practitioner

## 2021-11-19 ENCOUNTER — Other Ambulatory Visit: Payer: Self-pay | Admitting: Internal Medicine

## 2021-11-19 DIAGNOSIS — I5022 Chronic systolic (congestive) heart failure: Secondary | ICD-10-CM

## 2021-11-19 DIAGNOSIS — R0602 Shortness of breath: Secondary | ICD-10-CM

## 2021-11-24 ENCOUNTER — Ambulatory Visit
Admission: RE | Admit: 2021-11-24 | Discharge: 2021-11-24 | Disposition: A | Discharge status: Home / Self Care | Payer: Medicare Other | Source: Ambulatory Visit | Attending: Internal Medicine | Admitting: Internal Medicine

## 2021-11-24 ENCOUNTER — Encounter
Admission: RE | Admit: 2021-11-24 | Discharge: 2021-11-24 | Disposition: A | Discharge status: Home / Self Care | Payer: Medicare Other | Source: Ambulatory Visit | Attending: Internal Medicine | Admitting: Internal Medicine

## 2021-11-24 DIAGNOSIS — I110 Hypertensive heart disease with heart failure: Secondary | ICD-10-CM | POA: Diagnosis present

## 2021-11-24 DIAGNOSIS — E785 Hyperlipidemia, unspecified: Secondary | ICD-10-CM | POA: Insufficient documentation

## 2021-11-24 DIAGNOSIS — J449 Chronic obstructive pulmonary disease, unspecified: Secondary | ICD-10-CM | POA: Diagnosis not present

## 2021-11-24 DIAGNOSIS — Z87891 Personal history of nicotine dependence: Secondary | ICD-10-CM | POA: Insufficient documentation

## 2021-11-24 DIAGNOSIS — I350 Nonrheumatic aortic (valve) stenosis: Secondary | ICD-10-CM | POA: Insufficient documentation

## 2021-11-24 DIAGNOSIS — R0602 Shortness of breath: Secondary | ICD-10-CM | POA: Insufficient documentation

## 2021-11-24 DIAGNOSIS — I5022 Chronic systolic (congestive) heart failure: Secondary | ICD-10-CM | POA: Insufficient documentation

## 2021-11-24 DIAGNOSIS — E119 Type 2 diabetes mellitus without complications: Secondary | ICD-10-CM | POA: Diagnosis not present

## 2021-11-24 LAB — ECHOCARDIOGRAM COMPLETE
AR max vel: 1.07 cm2
AV Area VTI: 1.3 cm2
AV Area mean vel: 0.96 cm2
AV Mean grad: 10 mmHg
AV Peak grad: 16.5 mmHg
Ao pk vel: 2.03 m/s
Area-P 1/2: 4.24 cm2
MV VTI: 1.87 cm2
S' Lateral: 3.92 cm

## 2021-11-24 MED ORDER — TECHNETIUM TC 99M TETROFOSMIN IV KIT
31.8000 | PACK | Freq: Once | INTRAVENOUS | Status: AC | PRN
  Administered 2021-11-24: 31.8 via INTRAVENOUS

## 2021-11-24 MED ORDER — REGADENOSON 0.4 MG/5ML IV SOLN
0.4000 mg | Freq: Once | INTRAVENOUS | Status: AC
Start: 2021-11-24 — End: 2021-11-24
  Administered 2021-11-24: 0.4 mg via INTRAVENOUS

## 2021-11-24 MED ORDER — TECHNETIUM TC 99M TETROFOSMIN IV KIT
10.4100 | PACK | Freq: Once | INTRAVENOUS | Status: AC | PRN
  Administered 2021-11-24: 10.41 via INTRAVENOUS

## 2021-11-27 ENCOUNTER — Other Ambulatory Visit: Payer: Self-pay | Admitting: Internal Medicine

## 2021-11-27 ENCOUNTER — Ambulatory Visit: Payer: Medicare Other

## 2021-11-27 DIAGNOSIS — J449 Chronic obstructive pulmonary disease, unspecified: Secondary | ICD-10-CM

## 2021-11-30 LAB — NM MYOCAR MULTI W/SPECT W/WALL MOTION / EF
Base ST Depression (mm): 0 mm
Estimated workload: 1
Exercise duration (min): 1 min
Exercise duration (sec): 26 s
LV dias vol: 135 mL (ref 62–150)
LV sys vol: 47 mL
MPHR: 137 {beats}/min
Nuc Stress EF: 65 %
Peak HR: 75 {beats}/min
Percent HR: 54 %
Rest HR: 60 {beats}/min
Rest Nuclear Isotope Dose: 10.4 mCi
SDS: 4
SRS: 14
SSS: 12
Stress Nuclear Isotope Dose: 31.8 mCi
TID: 0.9

## 2021-12-05 ENCOUNTER — Encounter: Payer: Self-pay | Admitting: Nurse Practitioner

## 2021-12-07 ENCOUNTER — Other Ambulatory Visit: Payer: Self-pay | Admitting: Nurse Practitioner

## 2021-12-07 ENCOUNTER — Telehealth: Payer: Self-pay

## 2021-12-07 DIAGNOSIS — J9611 Chronic respiratory failure with hypoxia: Secondary | ICD-10-CM

## 2021-12-07 DIAGNOSIS — J441 Chronic obstructive pulmonary disease with (acute) exacerbation: Secondary | ICD-10-CM

## 2021-12-07 DIAGNOSIS — J0181 Other acute recurrent sinusitis: Secondary | ICD-10-CM

## 2021-12-07 MED ORDER — DOXYCYCLINE HYCLATE 100 MG PO TABS: 100.0000 mg | ORAL_TABLET | Freq: Two times a day (BID) | ORAL | 0 refills | Status: AC

## 2021-12-07 NOTE — Telephone Encounter (Signed)
pt called and has sinus infection, a lot of congestion and yellow mucus, with fever on Sat of 102.  pt is asking if he can get some abx sent in.  he is going to try to do covid test if he has one at home.   Called pt and informed him that Alyssa sent in doxy to the pharmacy, and she also ordered a chest xray, if he feels like the congestion is in his chest I would like for him to get a chest xray to rule out pneumonia, I sent the order to [facility] Pt made aware

## 2021-12-08 ENCOUNTER — Telehealth: Payer: Self-pay | Admitting: Primary Care

## 2021-12-08 NOTE — Telephone Encounter (Addendum)
Call received by on call RE needing to speak with someone about patient breathing. I returned call and no answer, returned call to home and reached family. Think patient has PNA, has been on doxycycline. Was ordered on 7/1 but only obtained it and began it on 7/3. Wife requesting for him to take prednisone. He was febrile on 7/1 to 102 reported, and now is 99 + but taking ibuprofen. He is able to speak on the phone but states he is too ill to go to a dr. [name]. I am recommending him to go to the ED, as he has not been able to get out for a CXR which was also ordered on 12/05/21 and is at risk for worsening respiratory status. Wife states she will call 911 and thanks me for calling. I spent 12 minutes in this episode.

## 2021-12-08 NOTE — Addendum Note (Signed)
Addended by: Marijo File on: 12/08/2021 04:36 PM   Modules accepted: Level of Service

## 2021-12-09 ENCOUNTER — Ambulatory Visit
Admission: RE | Admit: 2021-12-09 | Discharge: 2021-12-09 | Disposition: A | Discharge status: Home / Self Care | Payer: Medicare Other | Attending: Nurse Practitioner | Admitting: Nurse Practitioner

## 2021-12-09 ENCOUNTER — Ambulatory Visit
Admission: RE | Admit: 2021-12-09 | Discharge: 2021-12-09 | Disposition: A | Discharge status: Home / Self Care | Payer: Medicare Other | Source: Ambulatory Visit | Attending: Nurse Practitioner | Admitting: Nurse Practitioner

## 2021-12-09 DIAGNOSIS — J9611 Chronic respiratory failure with hypoxia: Secondary | ICD-10-CM

## 2021-12-09 DIAGNOSIS — J441 Chronic obstructive pulmonary disease with (acute) exacerbation: Secondary | ICD-10-CM | POA: Diagnosis present

## 2021-12-09 DIAGNOSIS — J0181 Other acute recurrent sinusitis: Secondary | ICD-10-CM | POA: Diagnosis present

## 2021-12-09 DIAGNOSIS — R531 Weakness: Secondary | ICD-10-CM | POA: Insufficient documentation

## 2021-12-09 DIAGNOSIS — M19012 Primary osteoarthritis, left shoulder: Secondary | ICD-10-CM | POA: Diagnosis not present

## 2021-12-09 DIAGNOSIS — J069 Acute upper respiratory infection, unspecified: Secondary | ICD-10-CM | POA: Diagnosis not present

## 2021-12-09 DIAGNOSIS — I517 Cardiomegaly: Secondary | ICD-10-CM | POA: Diagnosis not present

## 2021-12-10 ENCOUNTER — Encounter: Payer: Self-pay | Admitting: Nurse Practitioner

## 2021-12-10 ENCOUNTER — Ambulatory Visit (INDEPENDENT_AMBULATORY_CARE_PROVIDER_SITE_OTHER): Payer: Medicare Other | Admitting: Nurse Practitioner

## 2021-12-10 VITALS — BP 140/71 | HR 74 | Temp 98.8°F | Resp 16 | Ht 70.5 in | Wt 232.0 lb

## 2021-12-10 DIAGNOSIS — J9611 Chronic respiratory failure with hypoxia: Secondary | ICD-10-CM

## 2021-12-10 DIAGNOSIS — J449 Chronic obstructive pulmonary disease, unspecified: Secondary | ICD-10-CM | POA: Diagnosis not present

## 2021-12-10 DIAGNOSIS — R0602 Shortness of breath: Secondary | ICD-10-CM | POA: Diagnosis not present

## 2021-12-10 DIAGNOSIS — Z9981 Dependence on supplemental oxygen: Secondary | ICD-10-CM | POA: Diagnosis not present

## 2021-12-10 DIAGNOSIS — G4733 Obstructive sleep apnea (adult) (pediatric): Secondary | ICD-10-CM | POA: Diagnosis not present

## 2021-12-10 MED ORDER — REVEFENACIN 175 MCG/3ML IN SOLN: 175.0000 ug | Freq: Every day | RESPIRATORY_TRACT | 2 refills | Status: DC

## 2021-12-10 NOTE — Progress Notes (Signed)
[facility] [address] [place], [place] [address]  Internal MEDICINE  Office Visit Note  Patient Name: [name]  [id number]  [id number]  Date of Service: 12/10/2021  Chief Complaint  Patient presents with   Follow-up    Had a fever over the weekend, neg covid test    Sleep Apnea   COPD   Asthma   Shortness of Breath   Results    HPI Kamel presents for a follow-up visit for COPD, SOB and OSA. He wears continuous home oxygen, usually 3 LPM at home but is only able to use 2 LPM when out of the house. His symptoms of COPD are not adequately controlled even with trelegy ellipta inhaler which has helped some. He has a chronic cough and wheezing. He uses his Duoneb nebulizer treatments multiple times per day with only temporary relief.  He also takes daliresp and montelukast. He also has an albuterol rescue inhaler that he has been using more often, typically daily.  Spirometry done in office today: result shows severe airway obstruction, slightly decreased when compared to previous spiro in march 2022. FVC is 1.46 which is 36.72% and FEV1 is 0.57 which is 20.2%. overall FEV1/FVC ratio is significantly decreased at 38.77% when compared to predicted value of 70.92%.    Current Medication: Outpatient Encounter Medications as of 12/10/2021  Medication Sig   albuterol (VENTOLIN HFA) 108 (90 Base) MCG/ACT inhaler Inhale 2 puffs into the lungs every 6 (six) hours as needed for wheezing or shortness of breath.   aspirin EC 81 MG tablet Take 81 mg by mouth daily.   Bromfenac Sodium 0.075 % SOLN Apply 1 drop to eye 2 (two) times daily.   carvedilol (COREG) 25 MG tablet Take 25 mg by mouth 2 (two) times daily with a meal.   cholecalciferol (VITAMIN D) 1000 units tablet Take 1,000 Units by mouth at bedtime.    Difluprednate 0.05 % EMUL Apply 1 drop to eye 2 (two) times daily.   [EXPIRED] doxycycline (VIBRA-TABS) 100 MG tablet Take 1 tablet (100 mg total) by mouth 2 (two) times daily  for 10 days.   ergocalciferol (VITAMIN D2) 50000 units capsule Take 50,000 Units by mouth once a week.   Exenatide ER 2 MG PEN Inject into the skin once a week.   fexofenadine (ALLEGRA) 180 MG tablet Take 180 mg by mouth daily.   Fluticasone-Umeclidin-Vilant (TRELEGY ELLIPTA) 100-62.5-25 MCG/ACT AEPB Inhale 1 puff into the lungs daily.   furosemide (LASIX) 20 MG tablet Take 20 mg by mouth 2 (two) times daily.   gabapentin (NEURONTIN) 600 MG tablet Take 600 mg by mouth 2 (two) times daily.   glipiZIDE (GLUCOTROL) 5 MG tablet Take 5 mg by mouth daily before breakfast.   ipratropium-albuterol (DUONEB) 0.5-2.5 (3) MG/3ML SOLN Take 3 mLs by nebulization every 4 (four) hours as needed. DX-J44.9   JARDIANCE 10 MG TABS tablet Take 10 mg by mouth daily.   levothyroxine (SYNTHROID, LEVOTHROID) 137 MCG tablet Take 137 mcg by mouth daily before breakfast.   magnesium oxide (MAG-OX) 400 MG tablet Take 400 mg by mouth 2 (two) times daily.   montelukast (SINGULAIR) 10 MG tablet Take 10 mg by mouth at bedtime.   nepafenac (ILEVRO) 0.3 % ophthalmic suspension 1 drop at bedtime.    OXYGEN Inhale into the lungs. 2 litre at night   pravastatin (PRAVACHOL) 20 MG tablet Take 20 mg by mouth at bedtime.   predniSONE (DELTASONE) 50 MG tablet Take 1 tablet (50  mg total) by mouth daily with breakfast for 10 days.   predniSONE (STERAPRED UNI-PAK 48 TAB) 10 MG (48) TBPK tablet Use as directed.   quinapril (ACCUPRIL) 40 MG tablet Take 20 mg by mouth at bedtime.   ranitidine (ZANTAC) 150 MG tablet Take 150 mg by mouth at bedtime.   revefenacin (YUPELRI) 175 MCG/3ML nebulizer solution Take 3 mLs (175 mcg total) by nebulization daily.   Roflumilast 250 MCG TABS TAKE 1 TABLET BY MOUTH EVERY DAY   tamsulosin (FLOMAX) 0.4 MG CAPS capsule Take 1 capsule (0.4 mg total) by mouth daily after supper.   Travoprost, BAK Free, (TRAVATAN) 0.004 % SOLN ophthalmic solution 1 drop at bedtime.   [DISCONTINUED] albuterol (PROAIR HFA) 108 (90  Base) MCG/ACT inhaler Inhale 2 puffs into the lungs every 6 (six) hours as needed for wheezing or shortness of breath.   [DISCONTINUED] predniSONE (DELTASONE) 10 MG tablet Take 1 tablet (10 mg total) by mouth daily with breakfast.   [DISCONTINUED] benzonatate (TESSALON) 100 MG capsule Take 1 capsule (100 mg total) by mouth 2 (two) times daily as needed for cough. (Patient not taking: Reported on 12/10/2021)   No facility-administered encounter medications on file as of 12/10/2021.    Surgical History: Past Surgical History:  Procedure Laterality Date   CARDIAC CATHETERIZATION     CATARACT EXTRACTION W/PHACO Right 09/01/2015   Procedure: CATARACT EXTRACTION PHACO AND INTRAOCULAR LENS PLACEMENT (IOC);  Surgeon: Sallee Lange, MD;  Location: [facility] ORS;  Service: Ophthalmology;  Laterality: Right;  [place] 01:33 AP% 24.9 CDE 42.99 fluid pack lot # [id number]   CATARACT EXTRACTION W/PHACO Left 09/22/2015   Procedure: CATARACT EXTRACTION PHACO AND INTRAOCULAR LENS PLACEMENT (IOC);  Surgeon: Sallee Lange, MD;  Location: [facility] ORS;  Service: Ophthalmology;  Laterality: Left;  [place] 01:28 AP% 20.9 CDE 29.54 fluid pack lot # [id number]   CHOLECYSTECTOMY     EYE SURGERY     Orbital Decompression     TONSILLECTOMY      Medical History: Past Medical History:  Diagnosis Date   Arthritis    Asthma    CHF (congestive heart failure) (HCC)    COPD (chronic obstructive pulmonary disease) (HCC)    Diabetes mellitus without complication (HCC)    GERD (gastroesophageal reflux disease)    H/O Graves' disease    H/O wheezing    HOH (hard of hearing)    Hypercholesterolemia    Hypertension    Hypothyroidism    Lower extremity edema    Multiple allergies    Palpitations    Peripheral vascular disease (HCC)    swelling of feet and legs   Pneumonia    in the past   PONV (postoperative nausea and vomiting)    Shortness of breath dyspnea    Sleep apnea    CPAP    Family History: Family History   Problem Relation Age of Onset   Diabetes Father     Social History   Socioeconomic History   Marital status: Married    Spouse name: Not on file   Number of children: Not on file   Years of education: Not on file   Highest education level: Not on file  Occupational History   Not on file  Tobacco Use   Smoking status: Former   Smokeless tobacco: Never  Substance and Sexual Activity   Alcohol use: Yes    Comment: couple glasses of wine daily   Drug use: No   Sexual activity: Not on file  Other Topics Concern  Not on file  Social History Narrative   Not on file   Social Determinants of Health   Financial Resource Strain: Not on file  Food Insecurity: Not on file  Transportation Needs: Not on file  Physical Activity: Not on file  Stress: Not on file  Social Connections: Not on file  Intimate Partner Violence: Not on file      Review of Systems  Constitutional:  Positive for fatigue. Negative for appetite change, chills, fever and unexpected weight change.  HENT: Negative.    Eyes: Negative.   Respiratory:  Positive for cough, shortness of breath and wheezing. Negative for chest tightness.   Cardiovascular: Negative.  Negative for chest pain and palpitations.  Gastrointestinal:  Negative for abdominal pain, constipation, diarrhea, nausea and vomiting.  Musculoskeletal:  Positive for arthralgias and gait problem.  Neurological:  Negative for dizziness, light-headedness and headaches.  Psychiatric/Behavioral:  Negative for behavioral problems, self-injury and suicidal ideas. The patient is not nervous/anxious.     Vital Signs: BP 140/71   Pulse 74   Temp 98.8 F (37.1 C)   Resp 16   Ht 5' 10.5" (1.791 m)   Wt 232 lb (105.2 kg)   SpO2 (!) 84% Comment: room air, cannot use morethan 2 LPM on his portable oxygen.  BMI 32.82 kg/m    Physical Exam Vitals reviewed.  Constitutional:      General: He is not in acute distress.    Appearance: Normal appearance.  He is well-developed. He is obese. He is ill-appearing.  HENT:     Head: Normocephalic and atraumatic.  Eyes:     Pupils: Pupils are equal, round, and reactive to light.  Cardiovascular:     Rate and Rhythm: Normal rate and regular rhythm.     Pulses: Normal pulses.     Heart sounds: Normal heart sounds. No murmur heard. Pulmonary:     Effort: Pulmonary effort is normal. No accessory muscle usage or respiratory distress.     Breath sounds: Examination of the right-upper field reveals wheezing. Examination of the left-upper field reveals wheezing. Examination of the right-middle field reveals decreased breath sounds and wheezing. Examination of the left-middle field reveals decreased breath sounds and wheezing. Examination of the right-lower field reveals decreased breath sounds. Examination of the left-lower field reveals decreased breath sounds. Decreased breath sounds and wheezing present.  Skin:    General: Skin is warm and dry.     Capillary Refill: Capillary refill takes less than 2 seconds.  Neurological:     Mental Status: He is alert and oriented to person, place, and time.     Motor: No weakness.     Gait: Gait abnormal.  Psychiatric:        Mood and Affect: Mood normal.        Behavior: Behavior normal.        Assessment/Plan: 1. Chronic respiratory failure with hypoxia, on home O2 therapy (HCC) On continuous oxygen, still desats when out of house due to not able to use 3 LPM, only 2 LPM. Uses 3 LPM at home. Yupelri long acting nebulizer treatment prescribed, samples provided in office.  - Spirometry with Graph - revefenacin (YUPELRI) 175 MCG/3ML nebulizer solution; Take 3 mLs (175 mcg total) by nebulization daily.  Dispense: 90 mL; Refill: 2  2. Chronic obstructive pulmonary disease, unspecified COPD type (HCC) Severe COPD, spiro results show severe airway obstruction, slightly worse when compared to spiro from march last year.  - Spirometry with Graph - revefenacin  (  YUPELRI) 175 MCG/3ML nebulizer solution; Take 3 mLs (175 mcg total) by nebulization daily.  Dispense: 90 mL; Refill: 2  3. SOB (shortness of breath) Severe airway obstruction on in office spirometry. Trialing Yupelri long acting nebulizer treatment, samples provided. Prednisone taper provided, albuterol inhaler refills ordered - Spirometry with Graph - revefenacin (YUPELRI) 175 MCG/3ML nebulizer solution; Take 3 mLs (175 mcg total) by nebulization daily.  Dispense: 90 mL; Refill: 2 - albuterol (VENTOLIN HFA) 108 (90 Base) MCG/ACT inhaler; Inhale 2 puffs into the lungs every 6 (six) hours as needed for wheezing or shortness of breath.  Dispense: 8 g; Refill: 5 - predniSONE (DELTASONE) 50 MG tablet; Take 1 tablet (50 mg total) by mouth daily with breakfast for 10 days.  Dispense: 10 tablet; Refill: 0 - predniSONE (STERAPRED UNI-PAK 48 TAB) 10 MG (48) TBPK tablet; Use as directed.  Dispense: 48 each; Refill: 0  4. OSA treated with BiPAP Continues to do well. No issues or concerns. Does not need supplies right now.    General Counseling: Angelo verbalizes understanding of the findings of todays visit and agrees with plan of treatment. I have discussed any further diagnostic evaluation that may be needed or ordered today. We also reviewed his medications today. he has been encouraged to call the office with any questions or concerns that should arise related to todays visit.    Orders Placed This Encounter  Procedures   Spirometry with Graph    Meds ordered this encounter  Medications   revefenacin (YUPELRI) 175 MCG/3ML nebulizer solution    Sig: Take 3 mLs (175 mcg total) by nebulization daily.    Dispense:  90 mL    Refill:  2   albuterol (VENTOLIN HFA) 108 (90 Base) MCG/ACT inhaler    Sig: Inhale 2 puffs into the lungs every 6 (six) hours as needed for wheezing or shortness of breath.    Dispense:  8 g    Refill:  5   predniSONE (DELTASONE) 50 MG tablet    Sig: Take 1 tablet (50 mg  total) by mouth daily with breakfast for 10 days.    Dispense:  10 tablet    Refill:  0   predniSONE (STERAPRED UNI-PAK 48 TAB) 10 MG (48) TBPK tablet    Sig: Use as directed.    Dispense:  48 each    Refill:  0    Fill with 50 mg prednisone tablet, for patient to taper from increased dose back down to maintenance.    Return in about 1 month (around 01/10/2022) for F/U, pulmonary/sleep, Marcia Lepera.   Total time spent:30 Minutes Time spent includes review of chart, medications, test results, and follow up plan with the patient.   [place] Controlled Substance Database was reviewed by me.  This patient was seen by Sallyanne Kuster, FNP-C in collaboration with Dr. Beverely Risen as a part of collaborative care agreement.   Alizee Maple R. Tedd Sias, MSN, FNP-C Internal medicine

## 2021-12-14 ENCOUNTER — Other Ambulatory Visit: Payer: Self-pay

## 2021-12-14 DIAGNOSIS — J018 Other acute sinusitis: Secondary | ICD-10-CM

## 2021-12-14 MED ORDER — PREDNISONE 10 MG PO TABS: 10.0000 mg | ORAL_TABLET | Freq: Every day | ORAL | 1 refills | Status: DC

## 2021-12-14 NOTE — Telephone Encounter (Signed)
Pt called for refill of prednisone, per Alyssa ok to refill, spoke to pt and informed him. Pt also mentioned that samples of Mikael Spray is making an improvement.

## 2021-12-15 DIAGNOSIS — I350 Nonrheumatic aortic (valve) stenosis: Secondary | ICD-10-CM | POA: Insufficient documentation

## 2021-12-17 ENCOUNTER — Telehealth: Payer: Self-pay

## 2021-12-17 NOTE — Telephone Encounter (Signed)
Pt called this morning to say they are out of Yupelri sample and would like to continue medication course. Governor Specking is currently working on prior authorization for patient and will contact patient once status of prior authorization is known, patient was also made aware.

## 2021-12-29 ENCOUNTER — Other Ambulatory Visit: Payer: Medicare Other | Admitting: Nurse Practitioner

## 2021-12-29 ENCOUNTER — Encounter: Payer: Self-pay | Admitting: Nurse Practitioner

## 2021-12-29 DIAGNOSIS — R0602 Shortness of breath: Secondary | ICD-10-CM

## 2021-12-29 DIAGNOSIS — G8929 Other chronic pain: Secondary | ICD-10-CM

## 2021-12-29 DIAGNOSIS — G4700 Insomnia, unspecified: Secondary | ICD-10-CM

## 2021-12-29 DIAGNOSIS — Z515 Encounter for palliative care: Secondary | ICD-10-CM

## 2021-12-29 NOTE — Progress Notes (Signed)
Therapist, nutritional Palliative Care Consult Note Telephone: [phone]  Fax: [phone]    Date of encounter: 12/29/21 1:30 PM PATIENT NAME: [name] [address] [place] [address]   [phone] (home)  DOB: [birth year] MRN: [id number] PRIMARY CARE PROVIDER:    Alan Mulder, MD,  [address] [place] [address] [phone] RESPONSIBLE PARTY:    Contact Information     Name Relation Home Work Mobile   Aaron, [name] [phone]        Due to the COVID-19 crisis, this visit was done via telemedicine from my office and it was initiated and consent by this patient and or family.  I connected with  [name] OR PROXY on 12/29/21 by telephone as video not available enabled telemedicine application and verified that I am speaking with the correct person using two identifiers.   I discussed the limitations of evaluation and management by telemedicine. The patient expressed understanding and agreed to proceed. Palliative Care was asked to follow this patient by consultation request of  Morayati, Delsa Sale, MD to address advance care planning and complex medical decision making. This is a follow up visit.                                  ASSESSMENT AND PLAN / RECOMMENDATIONS:  Symptom Management/Plan: 1. ACP; DNR/MOST form in vynca   2. Shortness of breath with debility secondary to COPD, CHF, continue to monitor respiratory status, inhalation therapy, supplemental O2, continue trilogy, continue with pulmonology with Yupelvi nebs when able to get filled.    3. Insomnia, improved, discussed sleep patterns, sleep hygiene. Discussed can continue current dose trazodone    4. Right shoulder pain, stable discussed, chronic; reviewed continue with topicals. Pending call to schedule with orthopedic for injection. Tylenol as needed   5. Palliative care encounter; Palliative care encounter; Palliative medicine team will continue  to support patient, patient's family, and medical team. Visit consisted of counseling and education dealing with the complex and emotionally intense issues of symptom management and palliative care in the setting of serious and potentially life-threatening illness   Follow up Palliative Care Visit: Palliative care will continue to follow for complex medical decision making, advance care planning, and clarification of goals. Return 8 weeks or prn.   I spent 32 minutes providing this consultation. More than 50% of the time in this consultation was spent in counseling and care coordination. PPS: 50% Chief Complaint: Follow up palliative consult for complex medical decision making HISTORY OF PRESENT ILLNESS:  [name] is a 86 y.o. 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 On continuous oxygen, trilogy, insomnia. I called [name] for telephonic telemedicine as video not available for f/u telemedicine visit PC. We talked about how [name] has been feeling, ros including increase in drainage, worsening shortness of breath, recent pulmonology visit with prescribed Yupeli nebs which helped, had samples ran out, now waiting for rx to come through. We talked about shortness of breath, mobility, sleeping in the recliner, patterns. We talked about good appetite, no noted weigh loss. ROS including pain. We talked about pain with current regimen. We talked about  disease progression. Medical goals reviewed. Continue current poc. Support provided.   We talked about f/u pc visit, scheduled. Therapeutic listening, emotional support provided, questions answered.     History obtained from review of EMR, discussion with  [name].  I reviewed available labs, medications, imaging, studies and related documents from the EMR.  Records reviewed and summarized above.    ROS 10 point system reviewed all negative except HPI   Physical Exam: deferred Thank you for the opportunity to participate in the care of [name].  The palliative care team will continue to follow. Please call our office at [phone] if we can be of additional assistance.   Aaron Abend Prince Rome, NP

## 2021-12-30 MED ORDER — PREDNISONE 50 MG PO TABS: 50.0000 mg | ORAL_TABLET | Freq: Every day | ORAL | 0 refills | Status: AC

## 2021-12-30 MED ORDER — ALBUTEROL SULFATE HFA 108 (90 BASE) MCG/ACT IN AERS: 2.0000 | INHALATION_SPRAY | Freq: Four times a day (QID) | RESPIRATORY_TRACT | 5 refills | Status: AC | PRN

## 2021-12-30 MED ORDER — PREDNISONE 10 MG (48) PO TBPK: ORAL_TABLET | ORAL | 0 refills | Status: DC

## 2021-12-31 ENCOUNTER — Telehealth: Payer: Self-pay

## 2021-12-31 NOTE — Telephone Encounter (Signed)
Pt called to check on status of Yupelri prior authorization. Desha states she is almost done with prior authorization and will let patient know when it is done.

## 2022-01-11 ENCOUNTER — Ambulatory Visit: Payer: Medicare Other | Admitting: Nurse Practitioner

## 2022-02-10 ENCOUNTER — Other Ambulatory Visit: Payer: Medicare Other | Admitting: Nurse Practitioner

## 2022-02-10 ENCOUNTER — Encounter: Payer: Self-pay | Admitting: Nurse Practitioner

## 2022-02-10 DIAGNOSIS — J209 Acute bronchitis, unspecified: Secondary | ICD-10-CM

## 2022-02-10 DIAGNOSIS — J449 Chronic obstructive pulmonary disease, unspecified: Secondary | ICD-10-CM

## 2022-02-10 DIAGNOSIS — Z515 Encounter for palliative care: Secondary | ICD-10-CM

## 2022-02-10 DIAGNOSIS — R0602 Shortness of breath: Secondary | ICD-10-CM

## 2022-02-10 DIAGNOSIS — G4701 Insomnia due to medical condition: Secondary | ICD-10-CM

## 2022-02-10 NOTE — Progress Notes (Signed)
[facility] Consult Note Telephone: [phone]  Fax: [phone]    Date of encounter: 02/10/22 9:26 PM PATIENT NAME: [name] [address] [place] [address]   [phone] (home)  DOB: 21-Sep-1935 MRN: [id number] PRIMARY CARE PROVIDER:    Lenard Simmer, MD,  [address] [place] [address] [phone] RESPONSIBLE PARTY:    Contact Information       Name Relation Home Work Blevins E Spouse [phone]             I met face to face with patient and family in home. Palliative Care was asked to follow this patient by consultation request of  Morayati, Lourdes Sledge, MD to address advance care planning and complex medical decision making. This is a follow up visit.                                  ASSESSMENT AND PLAN / RECOMMENDATIONS: Symptom Management/Plan: 1. ACP; DNR/MOST form in vynca   2. Shortness of breath with debility secondary to COPD, CHF, continue to monitor respiratory status, inhalation therapy, supplemental O2, continue trilogy   3. Insomnia, discussed sleep patterns, sleep hygiene, we talked about increasing trazodone with risks and benefits. Will send in rx Rx: trazodone 133m qhs; #90; NO RF;    4. Shortness of breath secondary to bronchitis possible PNA with sinusitis; will add clindamycin; discussed at length about importance of f/u, continue trilogy, inhalation therapy, O2, if worsens go to ED;  Rx: Clindamycin [id number] x 14 days; #56; No RF Rx: Align [id number] daily x 30 days; #30; No RF   5. Palliative care encounter; Palliative care encounter; Palliative medicine team will continue to support patient, patient's family, and medical team. Visit consisted of counseling and education dealing with the complex and emotionally intense issues of symptom management and palliative care in the setting of serious and potentially life-threatening illness   Follow up Palliative  Care Visit: Palliative care will continue to follow for complex medical decision making, advance care planning, and clarification of goals. Return 6 weeks or prn.   Follow up Palliative Care Visit: Palliative care will continue to follow for complex medical decision making, advance care planning, and clarification of goals. Return 2 weeks or prn.   I spent 61 minutes providing this consultation. More than 50% of the time in this consultation was spent in counseling and care coordination.   PPS: 50%   Chief Complaint: Follow up palliative consult for complex medical decision making   HISTORY OF PRESENT ILLNESS:  [name] a 85 79o. 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 On 2L continuous oxygen, trilogy. I called [name] confirm palliative care in person visit and covid screening which is negative. Mr. and Mrs. [name] their home. Mr. HarDearingers sitting in the kitchen.. We talked about how Mr. HarErmiss been feeling.  [name] he saw Dr MorRonnald Collumr bronchitis with sinusitis received keflex with prednisone taper. Mr. HarStockhamdorses he continues to feel so bad he is unable to go for a follow up appointment with Dr MorRonnald Collume talked about ros, continues to  cough, productive yellow sputum, large amounts. [name] endorses sinus pressures have improved though overall weakness has worsen. [name] endorses he continues with O2, weight loss (is intentional) though appears very sunken cheeks, muscle wasting. [name], [name] and I talked about medical goals including how aggressive wishes are for treatment. We talked about giving a second abx, he has completed prednisone  taper. We talked about Clindamycin with Align for probiotic, [name] in agreement. We talked about overall decline, progression of chronic disease with recurrent concerns for exacerbations. We talked about going to ED, though Mr Lampert declined. We talked about option of hospice under medicare program with services provided, [name] declines at this time. We talked about increase in shortness of breath, inhalation therapy, trilogy. We talked about quality of life, importance of drawing labs and concern for PNA requiring cxr. [name] and I talked about [facility] in home primary care which he was in agreement for trying to help when he is not able to go to see Dr Ronnald Collum in the office due to weakness, shortness of breath. Will send information, send Rx's in by escribe. Discussed at length with Lelon Frohlich concerns for [name] worsening symptoms and waiting too long when he is so sick will require emergency interventions. Ann endorses she will continue to talk with [name], about going to see Dr Ronnald Collum this week if possible especially if he is feeling worse. Therapeutic listening emotional support provided. Question answered, f/u pc visit scheduled.   Therapeutic listening, emotional support provided. We talked about f/u PC visit, scheduled.    History obtained from review of EMR, discussion with wife, Lelon Frohlich and Mr. Goheen. I reviewed available labs, medications, imaging, studies and related documents from the EMR.  Records reviewed and summarized above.    ROS Full 10 system review of systems performed and negative with exception of: as per HPI.   Physical Exam: Constitutional: NAD General: frail appearing, chronically ill pleasant male EYES:  lids intact ENMT: oral mucous membranes moist CV: S1S2, RRR, +mild BLE edema Pulmonary: scattered rhonchi, decrease bases  Abdomen:normo-active BS + 4 quadrants, soft  MSK:  ambulatory Skin: warm and dry Neuro:  + generalized weakness,  no cognitive  impairment Psych: non-anxious affect, A and O x 3    Thank you for the opportunity to participate in the care of [name].  The palliative care team will continue to follow. Please call our office at [phone] if we can be of additional assistance.   Aundrey Elahi Ihor Gully, NP

## 2022-02-23 ENCOUNTER — Encounter: Payer: Self-pay | Admitting: Nurse Practitioner

## 2022-02-23 ENCOUNTER — Ambulatory Visit: Payer: Medicare Other | Admitting: Nurse Practitioner

## 2022-02-23 DIAGNOSIS — J209 Acute bronchitis, unspecified: Secondary | ICD-10-CM

## 2022-02-23 DIAGNOSIS — Z515 Encounter for palliative care: Secondary | ICD-10-CM

## 2022-02-23 DIAGNOSIS — R0602 Shortness of breath: Secondary | ICD-10-CM

## 2022-02-23 DIAGNOSIS — J449 Chronic obstructive pulmonary disease, unspecified: Secondary | ICD-10-CM

## 2022-02-23 NOTE — Progress Notes (Signed)
[facility] Consult Note Telephone: [phone]  Fax: [phone]    Date of encounter: 02/23/22 4:09 PM PATIENT NAME: [name] [address] [place] [address]   [phone] (home)  DOB: [birth year] MRN: [id number] PRIMARY CARE PROVIDER:    Lenard Simmer, MD,  [address] [place] [address] [phone]  REFERRING PROVIDER:   Lenard Simmer, MD [address] [place],  [place] [address] [phone]  RESPONSIBLE PARTY:    Contact Information     Name Relation Home Work Cornell E Spouse [phone]        Due to the COVID-19 crisis, this visit was done via telemedicine from my office and it was initiated and consent by this patient and or family.  I connected with  [name] OR PROXY on 02/23/22 by a telephone as video not available enabled telemedicine application and verified that I am speaking with the correct person using two identifiers.   I discussed the limitations of evaluation and management by telemedicine. The patient expressed understanding and agreed to proceed. Palliative Care was asked to follow this patient by consultation request of  Morayati, Lourdes Sledge, MD to address advance care planning and complex medical decision making. This is a follow up visit.                                ASSESSMENT AND PLAN / RECOMMENDATIONS:  Symptom Management/Plan: 1. ACP; DNR/MOST form in vynca   2. Shortness of breath secondary to bronchitis possible PNA with sinusitis; significantly improved; will completed clindamycin; discussed at length about importance of f/u, continue trilogy, inhalation therapy, O2,    3. Palliative care encounter; Palliative care encounter; Palliative medicine team will continue to support patient, patient's family, and medical team. Visit consisted of counseling and education dealing with the complex and emotionally intense issues of symptom management and  palliative care in the setting of serious and potentially life-threatening illness   Follow up Palliative Care Visit: Palliative care will continue to follow for complex medical decision making, advance care planning, and clarification of goals. Return 6 weeks or prn.   Follow up Palliative Care Visit: Palliative care will continue to follow for complex medical decision making, advance care planning, and clarification of goals. Return 2 weeks or prn.   I spent 31 minutes providing this consultation. More than 50% of the time in this consultation was spent in counseling and care coordination.   PPS: 50%   Chief Complaint: Follow up palliative consult for complex medical decision making   HISTORY OF PRESENT ILLNESS:  [name] is a 86 y.o. 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 On 2L continuous oxygen, trilogy. I called [name] for telemedicine visit. [name] endorses feeling much better; reviewed goals, ros, functional abilities. Recently saw Dr Ronnald Collum with labs completed "looks good". No other changes. [name] is still trying to schedule visit to establish care with [facility] in home care. Therapeutic listening emotional support provided. Question answered, f/u pc visit scheduled.    Therapeutic listening, emotional support provided. We talked about f/u PC visit, scheduled.   History obtained from review of EMR, discussion with  [name].  I reviewed available labs, medications, imaging, studies and related documents from the EMR.  Records reviewed and summarized above.   ROS 10 point ros all negative except HPI  Physical Exam: deferred Thank you for the  opportunity to participate in the care of [name].  The palliative care team will continue to follow. Please call our office at [phone] if we can be of additional assistance.   [name] Ihor Gully, NP

## 2022-03-15 ENCOUNTER — Other Ambulatory Visit: Payer: Self-pay | Admitting: Nurse Practitioner

## 2022-03-15 DIAGNOSIS — J9611 Chronic respiratory failure with hypoxia: Secondary | ICD-10-CM

## 2022-03-15 DIAGNOSIS — R0602 Shortness of breath: Secondary | ICD-10-CM

## 2022-03-15 DIAGNOSIS — J449 Chronic obstructive pulmonary disease, unspecified: Secondary | ICD-10-CM

## 2022-04-20 ENCOUNTER — Telehealth: Payer: Self-pay

## 2022-04-20 NOTE — Telephone Encounter (Signed)
PC SW connected with patient via telephone.  PC visit with PC NP C. Gusler canceled for tomorrow. SW informed patient of new PC program guidelines to remain on services. Patient shared that he did not feel that needed PC services at this time due to being established with [facility].   Patient will be discharged from palliative care services at this time.

## 2022-04-21 ENCOUNTER — Other Ambulatory Visit: Payer: Medicare Other | Admitting: Nurse Practitioner

## 2022-04-27 ENCOUNTER — Other Ambulatory Visit: Payer: Medicare Other | Admitting: Nurse Practitioner

## 2022-05-14 ENCOUNTER — Other Ambulatory Visit: Payer: Self-pay | Admitting: Internal Medicine

## 2022-05-14 ENCOUNTER — Telehealth: Payer: Self-pay | Admitting: Nurse Practitioner

## 2022-05-14 ENCOUNTER — Other Ambulatory Visit: Payer: Self-pay | Admitting: Nurse Practitioner

## 2022-05-14 DIAGNOSIS — J449 Chronic obstructive pulmonary disease, unspecified: Secondary | ICD-10-CM

## 2022-05-14 NOTE — Telephone Encounter (Signed)
Pt need appt for refills  ?

## 2022-05-14 NOTE — Telephone Encounter (Signed)
Pt need appt for refills with [facility] I can send 30 days

## 2022-05-14 NOTE — Telephone Encounter (Signed)
Lvm to schedule pulmonary follow up-Toni 

## 2022-05-20 ENCOUNTER — Emergency Department (HOSPITAL_BASED_OUTPATIENT_CLINIC_OR_DEPARTMENT_OTHER): Payer: Medicare Other

## 2022-05-20 ENCOUNTER — Inpatient Hospital Stay (HOSPITAL_BASED_OUTPATIENT_CLINIC_OR_DEPARTMENT_OTHER)
Admission: EM | Admit: 2022-05-20 | Discharge: 2022-05-24 | DRG: 291 | Disposition: A | Discharge status: Home Health | Payer: Medicare Other | Attending: Family Medicine | Admitting: Family Medicine

## 2022-05-20 ENCOUNTER — Emergency Department (HOSPITAL_BASED_OUTPATIENT_CLINIC_OR_DEPARTMENT_OTHER): Payer: Medicare Other | Admitting: Radiology

## 2022-05-20 ENCOUNTER — Other Ambulatory Visit: Payer: Self-pay

## 2022-05-20 DIAGNOSIS — S41112A Laceration without foreign body of left upper arm, initial encounter: Secondary | ICD-10-CM

## 2022-05-20 DIAGNOSIS — N183 Chronic kidney disease, stage 3 unspecified: Secondary | ICD-10-CM | POA: Diagnosis present

## 2022-05-20 DIAGNOSIS — E875 Hyperkalemia: Secondary | ICD-10-CM | POA: Insufficient documentation

## 2022-05-20 DIAGNOSIS — Z881 Allergy status to other antibiotic agents status: Secondary | ICD-10-CM

## 2022-05-20 DIAGNOSIS — J441 Chronic obstructive pulmonary disease with (acute) exacerbation: Secondary | ICD-10-CM | POA: Insufficient documentation

## 2022-05-20 DIAGNOSIS — S51812A Laceration without foreign body of left forearm, initial encounter: Secondary | ICD-10-CM | POA: Diagnosis present

## 2022-05-20 DIAGNOSIS — Z833 Family history of diabetes mellitus: Secondary | ICD-10-CM

## 2022-05-20 DIAGNOSIS — E1122 Type 2 diabetes mellitus with diabetic chronic kidney disease: Secondary | ICD-10-CM | POA: Diagnosis present

## 2022-05-20 DIAGNOSIS — J9611 Chronic respiratory failure with hypoxia: Secondary | ICD-10-CM | POA: Diagnosis present

## 2022-05-20 DIAGNOSIS — E0500 Thyrotoxicosis with diffuse goiter without thyrotoxic crisis or storm: Secondary | ICD-10-CM | POA: Insufficient documentation

## 2022-05-20 DIAGNOSIS — I5033 Acute on chronic diastolic (congestive) heart failure: Secondary | ICD-10-CM | POA: Diagnosis present

## 2022-05-20 DIAGNOSIS — Z7985 Long-term (current) use of injectable non-insulin antidiabetic drugs: Secondary | ICD-10-CM

## 2022-05-20 DIAGNOSIS — J018 Other acute sinusitis: Secondary | ICD-10-CM

## 2022-05-20 DIAGNOSIS — Z79899 Other long term (current) drug therapy: Secondary | ICD-10-CM

## 2022-05-20 DIAGNOSIS — S0500XA Injury of conjunctiva and corneal abrasion without foreign body, unspecified eye, initial encounter: Secondary | ICD-10-CM

## 2022-05-20 DIAGNOSIS — Z888 Allergy status to other drugs, medicaments and biological substances status: Secondary | ICD-10-CM

## 2022-05-20 DIAGNOSIS — I5031 Acute diastolic (congestive) heart failure: Secondary | ICD-10-CM | POA: Diagnosis not present

## 2022-05-20 DIAGNOSIS — Z7951 Long term (current) use of inhaled steroids: Secondary | ICD-10-CM

## 2022-05-20 DIAGNOSIS — Z9981 Dependence on supplemental oxygen: Secondary | ICD-10-CM

## 2022-05-20 DIAGNOSIS — Z23 Encounter for immunization: Secondary | ICD-10-CM

## 2022-05-20 DIAGNOSIS — S0181XA Laceration without foreign body of other part of head, initial encounter: Secondary | ICD-10-CM | POA: Diagnosis present

## 2022-05-20 DIAGNOSIS — Z7982 Long term (current) use of aspirin: Secondary | ICD-10-CM

## 2022-05-20 DIAGNOSIS — Z79890 Hormone replacement therapy (postmenopausal): Secondary | ICD-10-CM

## 2022-05-20 DIAGNOSIS — I130 Hypertensive heart and chronic kidney disease with heart failure and stage 1 through stage 4 chronic kidney disease, or unspecified chronic kidney disease: Secondary | ICD-10-CM | POA: Diagnosis not present

## 2022-05-20 DIAGNOSIS — E1151 Type 2 diabetes mellitus with diabetic peripheral angiopathy without gangrene: Secondary | ICD-10-CM | POA: Diagnosis present

## 2022-05-20 DIAGNOSIS — Y92009 Unspecified place in unspecified non-institutional (private) residence as the place of occurrence of the external cause: Secondary | ICD-10-CM

## 2022-05-20 DIAGNOSIS — I509 Heart failure, unspecified: Principal | ICD-10-CM

## 2022-05-20 DIAGNOSIS — E1140 Type 2 diabetes mellitus with diabetic neuropathy, unspecified: Secondary | ICD-10-CM | POA: Diagnosis present

## 2022-05-20 DIAGNOSIS — W07XXXA Fall from chair, initial encounter: Secondary | ICD-10-CM | POA: Diagnosis present

## 2022-05-20 DIAGNOSIS — E039 Hypothyroidism, unspecified: Secondary | ICD-10-CM | POA: Diagnosis present

## 2022-05-20 DIAGNOSIS — G4730 Sleep apnea, unspecified: Secondary | ICD-10-CM | POA: Diagnosis present

## 2022-05-20 DIAGNOSIS — I10 Essential (primary) hypertension: Secondary | ICD-10-CM | POA: Insufficient documentation

## 2022-05-20 DIAGNOSIS — Z87891 Personal history of nicotine dependence: Secondary | ICD-10-CM

## 2022-05-20 DIAGNOSIS — S0101XA Laceration without foreign body of scalp, initial encounter: Secondary | ICD-10-CM | POA: Insufficient documentation

## 2022-05-20 DIAGNOSIS — N1831 Chronic kidney disease, stage 3a: Secondary | ICD-10-CM | POA: Diagnosis present

## 2022-05-20 DIAGNOSIS — I2510 Atherosclerotic heart disease of native coronary artery without angina pectoris: Secondary | ICD-10-CM | POA: Diagnosis present

## 2022-05-20 DIAGNOSIS — H1133 Conjunctival hemorrhage, bilateral: Secondary | ICD-10-CM | POA: Diagnosis present

## 2022-05-20 DIAGNOSIS — N179 Acute kidney failure, unspecified: Secondary | ICD-10-CM | POA: Diagnosis present

## 2022-05-20 DIAGNOSIS — K219 Gastro-esophageal reflux disease without esophagitis: Secondary | ICD-10-CM | POA: Diagnosis present

## 2022-05-20 DIAGNOSIS — E7800 Pure hypercholesterolemia, unspecified: Secondary | ICD-10-CM | POA: Diagnosis present

## 2022-05-20 DIAGNOSIS — E1165 Type 2 diabetes mellitus with hyperglycemia: Secondary | ICD-10-CM | POA: Insufficient documentation

## 2022-05-20 DIAGNOSIS — Z7984 Long term (current) use of oral hypoglycemic drugs: Secondary | ICD-10-CM

## 2022-05-20 DIAGNOSIS — Z7952 Long term (current) use of systemic steroids: Secondary | ICD-10-CM

## 2022-05-20 NOTE — ED Triage Notes (Signed)
BIB [facility] from home. Fell in home while sitting on stool, reaching down. 3LPM @ baseline for COPD. No thinners. No loc. Lac above left eye, left hand tear, left forearm lac- bleeding controlled. Bilateral scleral hematomas.  EMS 174/80 95HR 16RR

## 2022-05-20 NOTE — ED Notes (Signed)
CT called to expedite scan 

## 2022-05-21 ENCOUNTER — Emergency Department (HOSPITAL_BASED_OUTPATIENT_CLINIC_OR_DEPARTMENT_OTHER): Payer: Medicare Other

## 2022-05-21 ENCOUNTER — Encounter (HOSPITAL_COMMUNITY): Payer: Self-pay

## 2022-05-21 ENCOUNTER — Encounter (HOSPITAL_COMMUNITY): Payer: Self-pay | Admitting: Internal Medicine

## 2022-05-21 DIAGNOSIS — N179 Acute kidney failure, unspecified: Secondary | ICD-10-CM | POA: Diagnosis present

## 2022-05-21 DIAGNOSIS — I509 Heart failure, unspecified: Secondary | ICD-10-CM

## 2022-05-21 DIAGNOSIS — E039 Hypothyroidism, unspecified: Secondary | ICD-10-CM | POA: Diagnosis present

## 2022-05-21 DIAGNOSIS — Z881 Allergy status to other antibiotic agents status: Secondary | ICD-10-CM | POA: Diagnosis not present

## 2022-05-21 DIAGNOSIS — I5033 Acute on chronic diastolic (congestive) heart failure: Secondary | ICD-10-CM | POA: Diagnosis present

## 2022-05-21 DIAGNOSIS — E7800 Pure hypercholesterolemia, unspecified: Secondary | ICD-10-CM | POA: Diagnosis present

## 2022-05-21 DIAGNOSIS — Z888 Allergy status to other drugs, medicaments and biological substances status: Secondary | ICD-10-CM | POA: Diagnosis not present

## 2022-05-21 DIAGNOSIS — Z9981 Dependence on supplemental oxygen: Secondary | ICD-10-CM | POA: Diagnosis not present

## 2022-05-21 DIAGNOSIS — Z23 Encounter for immunization: Secondary | ICD-10-CM | POA: Diagnosis present

## 2022-05-21 DIAGNOSIS — I10 Essential (primary) hypertension: Secondary | ICD-10-CM | POA: Diagnosis not present

## 2022-05-21 DIAGNOSIS — J9611 Chronic respiratory failure with hypoxia: Secondary | ICD-10-CM | POA: Diagnosis not present

## 2022-05-21 DIAGNOSIS — I5031 Acute diastolic (congestive) heart failure: Secondary | ICD-10-CM

## 2022-05-21 DIAGNOSIS — S0101XA Laceration without foreign body of scalp, initial encounter: Secondary | ICD-10-CM | POA: Diagnosis not present

## 2022-05-21 DIAGNOSIS — G4730 Sleep apnea, unspecified: Secondary | ICD-10-CM | POA: Diagnosis present

## 2022-05-21 DIAGNOSIS — H1133 Conjunctival hemorrhage, bilateral: Secondary | ICD-10-CM | POA: Diagnosis present

## 2022-05-21 DIAGNOSIS — E1151 Type 2 diabetes mellitus with diabetic peripheral angiopathy without gangrene: Secondary | ICD-10-CM | POA: Diagnosis present

## 2022-05-21 DIAGNOSIS — W07XXXA Fall from chair, initial encounter: Secondary | ICD-10-CM | POA: Diagnosis present

## 2022-05-21 DIAGNOSIS — Z7952 Long term (current) use of systemic steroids: Secondary | ICD-10-CM | POA: Diagnosis not present

## 2022-05-21 DIAGNOSIS — E875 Hyperkalemia: Secondary | ICD-10-CM | POA: Diagnosis present

## 2022-05-21 DIAGNOSIS — I130 Hypertensive heart and chronic kidney disease with heart failure and stage 1 through stage 4 chronic kidney disease, or unspecified chronic kidney disease: Secondary | ICD-10-CM | POA: Diagnosis present

## 2022-05-21 DIAGNOSIS — Z79899 Other long term (current) drug therapy: Secondary | ICD-10-CM | POA: Diagnosis not present

## 2022-05-21 DIAGNOSIS — N1831 Chronic kidney disease, stage 3a: Secondary | ICD-10-CM | POA: Diagnosis present

## 2022-05-21 DIAGNOSIS — E1140 Type 2 diabetes mellitus with diabetic neuropathy, unspecified: Secondary | ICD-10-CM | POA: Diagnosis present

## 2022-05-21 DIAGNOSIS — J441 Chronic obstructive pulmonary disease with (acute) exacerbation: Secondary | ICD-10-CM | POA: Diagnosis not present

## 2022-05-21 DIAGNOSIS — Y92009 Unspecified place in unspecified non-institutional (private) residence as the place of occurrence of the external cause: Secondary | ICD-10-CM | POA: Diagnosis not present

## 2022-05-21 DIAGNOSIS — E1165 Type 2 diabetes mellitus with hyperglycemia: Secondary | ICD-10-CM | POA: Diagnosis present

## 2022-05-21 DIAGNOSIS — E0500 Thyrotoxicosis with diffuse goiter without thyrotoxic crisis or storm: Secondary | ICD-10-CM | POA: Diagnosis present

## 2022-05-21 DIAGNOSIS — E1122 Type 2 diabetes mellitus with diabetic chronic kidney disease: Secondary | ICD-10-CM | POA: Diagnosis present

## 2022-05-21 DIAGNOSIS — K219 Gastro-esophageal reflux disease without esophagitis: Secondary | ICD-10-CM | POA: Diagnosis present

## 2022-05-21 LAB — CBC WITH DIFFERENTIAL/PLATELET
Abs Immature Granulocytes: 0.05 10*3/uL (ref 0.00–0.07)
Basophils Absolute: 0 10*3/uL (ref 0.0–0.1)
Basophils Relative: 0 %
Eosinophils Absolute: 0 10*3/uL (ref 0.0–0.5)
Eosinophils Relative: 0 %
HCT: 37 % — ABNORMAL LOW (ref 39.0–52.0)
Hemoglobin: 11.9 g/dL — ABNORMAL LOW (ref 13.0–17.0)
Immature Granulocytes: 1 %
Lymphocytes Relative: 18 %
Lymphs Abs: 1.7 10*3/uL (ref 0.7–4.0)
MCH: 32.2 pg (ref 26.0–34.0)
MCHC: 32.2 g/dL (ref 30.0–36.0)
MCV: 100 fL (ref 80.0–100.0)
Monocytes Absolute: 0.7 10*3/uL (ref 0.1–1.0)
Monocytes Relative: 7 %
Neutro Abs: 7.2 10*3/uL (ref 1.7–7.7)
Neutrophils Relative %: 74 %
Platelets: 218 10*3/uL (ref 150–400)
RBC: 3.7 MIL/uL — ABNORMAL LOW (ref 4.22–5.81)
RDW: 15.1 % (ref 11.5–15.5)
WBC: 9.7 10*3/uL (ref 4.0–10.5)
nRBC: 0 % (ref 0.0–0.2)

## 2022-05-21 LAB — CBG MONITORING, ED
Glucose-Capillary: 124 mg/dL — ABNORMAL HIGH (ref 70–99)
Glucose-Capillary: 83 mg/dL (ref 70–99)

## 2022-05-21 LAB — BRAIN NATRIURETIC PEPTIDE: B Natriuretic Peptide: 149.8 pg/mL — ABNORMAL HIGH (ref 0.0–100.0)

## 2022-05-21 LAB — BASIC METABOLIC PANEL
Anion gap: 10 (ref 5–15)
BUN: 27 mg/dL — ABNORMAL HIGH (ref 8–23)
CO2: 33 mmol/L — ABNORMAL HIGH (ref 22–32)
Calcium: 9.1 mg/dL (ref 8.9–10.3)
Chloride: 91 mmol/L — ABNORMAL LOW (ref 98–111)
Creatinine, Ser: 1.45 mg/dL — ABNORMAL HIGH (ref 0.61–1.24)
GFR, Estimated: 48 mL/min — ABNORMAL LOW (ref >60)
Glucose, Bld: 204 mg/dL — ABNORMAL HIGH (ref 70–99)
Potassium: 4.5 mmol/L (ref 3.5–5.1)
Sodium: 134 mmol/L — ABNORMAL LOW (ref 135–145)

## 2022-05-21 LAB — GLUCOSE, CAPILLARY
Glucose-Capillary: 101 mg/dL — ABNORMAL HIGH (ref 70–99)
Glucose-Capillary: 253 mg/dL — ABNORMAL HIGH (ref 70–99)

## 2022-05-21 LAB — TSH: TSH: 2.769 u[IU]/mL (ref 0.350–4.500)

## 2022-05-21 MED ORDER — LEVOTHYROXINE SODIUM 137 MCG PO TABS
137.0000 ug | ORAL_TABLET | Freq: Every day | ORAL | Status: DC
  Administered 2022-05-21: 137 ug via ORAL
  Filled 2022-05-21: qty 1

## 2022-05-21 MED ORDER — METHYLPREDNISOLONE SODIUM SUCC 125 MG IJ SOLR
80.0000 mg | Freq: Once | INTRAMUSCULAR | Status: AC
  Administered 2022-05-21: 80 mg via INTRAVENOUS
  Filled 2022-05-21: qty 2

## 2022-05-21 MED ORDER — FLUORESCEIN SODIUM 1 MG OP STRP
1.0000 | ORAL_STRIP | Freq: Once | OPHTHALMIC | Status: AC
  Administered 2022-05-21: 1 via OPHTHALMIC
  Filled 2022-05-21: qty 1

## 2022-05-21 MED ORDER — ROFLUMILAST 500 MCG PO TABS
250.0000 ug | ORAL_TABLET | Freq: Every day | ORAL | Status: DC
  Administered 2022-05-22 – 2022-05-24 (×3): 250 ug via ORAL
  Filled 2022-05-21 (×4): qty 1

## 2022-05-21 MED ORDER — LIDOCAINE-EPINEPHRINE-TETRACAINE (LET) TOPICAL GEL
6.0000 mL | Freq: Once | TOPICAL | Status: AC
  Administered 2022-05-21: 6 mL via TOPICAL
  Filled 2022-05-21: qty 6

## 2022-05-21 MED ORDER — NEPAFENAC 0.3 % OP SUSP: 1.0000 [drp] | Freq: Every day | OPHTHALMIC | Status: DC

## 2022-05-21 MED ORDER — INSULIN ASPART 100 UNIT/ML IJ SOLN: 0.0000 [IU] | Freq: Three times a day (TID) | INTRAMUSCULAR | Status: DC

## 2022-05-21 MED ORDER — IPRATROPIUM-ALBUTEROL 0.5-2.5 (3) MG/3ML IN SOLN
3.0000 mL | RESPIRATORY_TRACT | Status: DC | PRN
  Administered 2022-05-21 (×2): 3 mL via RESPIRATORY_TRACT
  Filled 2022-05-21 (×2): qty 3

## 2022-05-21 MED ORDER — FLUTICASONE FUROATE-VILANTEROL 100-25 MCG/ACT IN AEPB
1.0000 | INHALATION_SPRAY | Freq: Every day | RESPIRATORY_TRACT | Status: DC
  Filled 2022-05-21: qty 28

## 2022-05-21 MED ORDER — TETANUS-DIPHTH-ACELL PERTUSSIS 5-2.5-18.5 LF-MCG/0.5 IM SUSY
0.5000 mL | PREFILLED_SYRINGE | Freq: Once | INTRAMUSCULAR | Status: AC
  Administered 2022-05-21: 0.5 mL via INTRAMUSCULAR
  Filled 2022-05-21: qty 0.5

## 2022-05-21 MED ORDER — ASPIRIN 81 MG PO TBEC: 81.0000 mg | DELAYED_RELEASE_TABLET | Freq: Every day | ORAL | Status: DC

## 2022-05-21 MED ORDER — UMECLIDINIUM BROMIDE 62.5 MCG/ACT IN AEPB
1.0000 | INHALATION_SPRAY | Freq: Every day | RESPIRATORY_TRACT | Status: DC
  Filled 2022-05-21: qty 7

## 2022-05-21 MED ORDER — FAMOTIDINE 20 MG PO TABS
20.0000 mg | ORAL_TABLET | Freq: Two times a day (BID) | ORAL | Status: DC
  Administered 2022-05-21 – 2022-05-24 (×7): 20 mg via ORAL
  Filled 2022-05-21 (×7): qty 1

## 2022-05-21 MED ORDER — INSULIN ASPART 100 UNIT/ML IJ SOLN
0.0000 [IU] | Freq: Every day | INTRAMUSCULAR | Status: DC
  Administered 2022-05-23: 2 [IU] via SUBCUTANEOUS

## 2022-05-21 MED ORDER — CITALOPRAM HYDROBROMIDE 20 MG PO TABS
40.0000 mg | ORAL_TABLET | Freq: Every day | ORAL | Status: DC
  Administered 2022-05-21 – 2022-05-24 (×3): 40 mg via ORAL
  Filled 2022-05-21 (×5): qty 2

## 2022-05-21 MED ORDER — BRIMONIDINE TARTRATE 0.2 % OP SOLN
1.0000 [drp] | Freq: Two times a day (BID) | OPHTHALMIC | Status: DC
  Administered 2022-05-21 – 2022-05-24 (×6): 1 [drp] via OPHTHALMIC
  Filled 2022-05-21: qty 5

## 2022-05-21 MED ORDER — REVEFENACIN 175 MCG/3ML IN SOLN
175.0000 ug | Freq: Every day | RESPIRATORY_TRACT | Status: DC
  Administered 2022-05-22 – 2022-05-24 (×3): 175 ug via RESPIRATORY_TRACT
  Filled 2022-05-21 (×3): qty 3

## 2022-05-21 MED ORDER — GABAPENTIN 600 MG PO TABS
600.0000 mg | ORAL_TABLET | Freq: Two times a day (BID) | ORAL | Status: DC
  Administered 2022-05-21 – 2022-05-24 (×6): 600 mg via ORAL
  Filled 2022-05-21 (×6): qty 1

## 2022-05-21 MED ORDER — ROFLUMILAST 250 MCG PO TABS
1.0000 | ORAL_TABLET | Freq: Every day | ORAL | Status: DC
  Administered 2022-05-21: 1 via ORAL

## 2022-05-21 MED ORDER — ARTIFICIAL TEARS OPHTHALMIC OINT
TOPICAL_OINTMENT | Freq: Four times a day (QID) | OPHTHALMIC | Status: DC
  Administered 2022-05-21: 1 via OPHTHALMIC
  Filled 2022-05-21 (×2): qty 3.5

## 2022-05-21 MED ORDER — FUROSEMIDE 10 MG/ML IJ SOLN
40.0000 mg | Freq: Two times a day (BID) | INTRAMUSCULAR | Status: DC
  Administered 2022-05-21 – 2022-05-24 (×6): 40 mg via INTRAVENOUS
  Filled 2022-05-21 (×6): qty 4

## 2022-05-21 MED ORDER — ALBUTEROL SULFATE HFA 108 (90 BASE) MCG/ACT IN AERS: 2.0000 | INHALATION_SPRAY | Freq: Four times a day (QID) | RESPIRATORY_TRACT | Status: DC | PRN

## 2022-05-21 MED ORDER — ENOXAPARIN SODIUM 40 MG/0.4ML IJ SOSY
40.0000 mg | PREFILLED_SYRINGE | INTRAMUSCULAR | Status: DC
  Administered 2022-05-21 – 2022-05-23 (×3): 40 mg via SUBCUTANEOUS
  Filled 2022-05-21 (×3): qty 0.4

## 2022-05-21 MED ORDER — PRAVASTATIN SODIUM 10 MG PO TABS
20.0000 mg | ORAL_TABLET | Freq: Every day | ORAL | Status: DC
  Administered 2022-05-21 – 2022-05-23 (×3): 20 mg via ORAL
  Filled 2022-05-21 (×3): qty 2

## 2022-05-21 MED ORDER — LATANOPROST 0.005 % OP SOLN: 1.0000 [drp] | Freq: Every day | OPHTHALMIC | Status: DC

## 2022-05-21 MED ORDER — LISINOPRIL 10 MG PO TABS
10.0000 mg | ORAL_TABLET | Freq: Every day | ORAL | Status: DC
  Administered 2022-05-21 – 2022-05-23 (×3): 10 mg via ORAL
  Filled 2022-05-21 (×3): qty 1

## 2022-05-21 MED ORDER — LIDOCAINE-EPINEPHRINE 2 %-1:100000 IJ SOLN
20.0000 mL | Freq: Once | INTRAMUSCULAR | Status: AC
  Administered 2022-05-21: 20 mL

## 2022-05-21 MED ORDER — MOMETASONE FURO-FORMOTEROL FUM 200-5 MCG/ACT IN AERO
2.0000 | INHALATION_SPRAY | Freq: Two times a day (BID) | RESPIRATORY_TRACT | Status: DC
  Administered 2022-05-21 – 2022-05-24 (×6): 2 via RESPIRATORY_TRACT
  Filled 2022-05-21: qty 8.8

## 2022-05-21 MED ORDER — CARVEDILOL 12.5 MG PO TABS
12.5000 mg | ORAL_TABLET | Freq: Two times a day (BID) | ORAL | Status: DC
  Administered 2022-05-22 – 2022-05-24 (×5): 12.5 mg via ORAL
  Filled 2022-05-21 (×5): qty 1

## 2022-05-21 MED ORDER — ACETAMINOPHEN 325 MG PO TABS: 650.0000 mg | ORAL_TABLET | ORAL | Status: DC | PRN

## 2022-05-21 MED ORDER — LATANOPROST 0.005 % OP SOLN
1.0000 [drp] | Freq: Every day | OPHTHALMIC | Status: DC
  Administered 2022-05-21 – 2022-05-23 (×3): 1 [drp] via OPHTHALMIC
  Filled 2022-05-21: qty 2.5

## 2022-05-21 MED ORDER — TAMSULOSIN HCL 0.4 MG PO CAPS
0.4000 mg | ORAL_CAPSULE | Freq: Every day | ORAL | Status: DC
  Administered 2022-05-21 – 2022-05-23 (×3): 0.4 mg via ORAL
  Filled 2022-05-21 (×4): qty 1

## 2022-05-21 MED ORDER — FUROSEMIDE 10 MG/ML IJ SOLN
40.0000 mg | Freq: Once | INTRAMUSCULAR | Status: AC
  Administered 2022-05-21: 40 mg via INTRAVENOUS
  Filled 2022-05-21: qty 4

## 2022-05-21 MED ORDER — LIOTHYRONINE SODIUM 5 MCG PO TABS
5.0000 ug | ORAL_TABLET | Freq: Every day | ORAL | Status: DC
  Administered 2022-05-22 – 2022-05-24 (×3): 5 ug via ORAL
  Filled 2022-05-21 (×4): qty 1

## 2022-05-21 MED ORDER — PREDNISONE 20 MG PO TABS
40.0000 mg | ORAL_TABLET | Freq: Every day | ORAL | Status: DC
  Administered 2022-05-22 – 2022-05-24 (×3): 40 mg via ORAL
  Filled 2022-05-21 (×3): qty 2

## 2022-05-21 MED ORDER — IOHEXOL 300 MG/ML  SOLN
100.0000 mL | Freq: Once | INTRAMUSCULAR | Status: AC | PRN
  Administered 2022-05-21: 75 mL via INTRAVENOUS

## 2022-05-21 MED ORDER — ERYTHROMYCIN 5 MG/GM OP OINT
TOPICAL_OINTMENT | Freq: Once | OPHTHALMIC | Status: AC
  Filled 2022-05-21: qty 3.5

## 2022-05-21 MED ORDER — MONTELUKAST SODIUM 10 MG PO TABS
10.0000 mg | ORAL_TABLET | Freq: Every day | ORAL | Status: DC
  Administered 2022-05-21 – 2022-05-23 (×3): 10 mg via ORAL
  Filled 2022-05-21 (×3): qty 1

## 2022-05-21 MED ORDER — CARVEDILOL 25 MG PO TABS
25.0000 mg | ORAL_TABLET | Freq: Two times a day (BID) | ORAL | Status: DC
  Administered 2022-05-21: 25 mg via ORAL
  Filled 2022-05-21: qty 2

## 2022-05-21 MED ORDER — INSULIN ASPART 100 UNIT/ML IJ SOLN
0.0000 [IU] | Freq: Three times a day (TID) | INTRAMUSCULAR | Status: DC
  Administered 2022-05-21: 2 [IU] via SUBCUTANEOUS
  Administered 2022-05-22: 5 [IU] via SUBCUTANEOUS
  Administered 2022-05-22: 15 [IU] via SUBCUTANEOUS
  Administered 2022-05-22: 11 [IU] via SUBCUTANEOUS
  Administered 2022-05-23: 3 [IU] via SUBCUTANEOUS
  Administered 2022-05-23: 8 [IU] via SUBCUTANEOUS
  Administered 2022-05-23: 3 [IU] via SUBCUTANEOUS
  Administered 2022-05-24: 5 [IU] via SUBCUTANEOUS
  Administered 2022-05-24: 2 [IU] via SUBCUTANEOUS

## 2022-05-21 MED ORDER — LEVOTHYROXINE SODIUM 25 MCG PO TABS
137.0000 ug | ORAL_TABLET | Freq: Every day | ORAL | Status: DC
  Administered 2022-05-22 – 2022-05-24 (×3): 137 ug via ORAL
  Filled 2022-05-21 (×3): qty 1

## 2022-05-21 MED ORDER — DIFLUPREDNATE 0.05 % OP EMUL: 1.0000 [drp] | Freq: Two times a day (BID) | OPHTHALMIC | Status: DC

## 2022-05-21 MED ORDER — GUAIFENESIN 100 MG/5ML PO LIQD
5.0000 mL | ORAL | Status: DC | PRN
  Administered 2022-05-22: 5 mL via ORAL
  Filled 2022-05-21: qty 10

## 2022-05-21 MED ORDER — TETRACAINE HCL 0.5 % OP SOLN
2.0000 [drp] | Freq: Once | OPHTHALMIC | Status: AC
  Administered 2022-05-21: 2 [drp] via OPHTHALMIC
  Filled 2022-05-21: qty 4

## 2022-05-21 MED ORDER — LISINOPRIL 10 MG PO TABS: 20.0000 mg | ORAL_TABLET | Freq: Every day | ORAL | Status: DC

## 2022-05-21 MED ORDER — BROMFENAC SODIUM 0.075 % OP SOLN: 1.0000 [drp] | Freq: Two times a day (BID) | OPHTHALMIC | Status: DC

## 2022-05-21 MED ORDER — SODIUM CHLORIDE 0.9 % IV SOLN
500.0000 mg | INTRAVENOUS | Status: AC
  Administered 2022-05-21: 500 mg via INTRAVENOUS
  Filled 2022-05-21: qty 5

## 2022-05-21 MED ORDER — EMPAGLIFLOZIN 10 MG PO TABS
10.0000 mg | ORAL_TABLET | Freq: Every day | ORAL | Status: DC
  Administered 2022-05-22 – 2022-05-24 (×3): 10 mg via ORAL
  Filled 2022-05-21 (×3): qty 1

## 2022-05-21 MED ORDER — GUAIFENESIN ER 600 MG PO TB12
1200.0000 mg | ORAL_TABLET | Freq: Two times a day (BID) | ORAL | Status: DC
  Administered 2022-05-21 – 2022-05-24 (×6): 1200 mg via ORAL
  Filled 2022-05-21 (×6): qty 2

## 2022-05-21 MED ORDER — BUDESONIDE 0.5 MG/2ML IN SUSP
2.0000 mg | Freq: Two times a day (BID) | RESPIRATORY_TRACT | Status: DC
  Administered 2022-05-21 – 2022-05-24 (×6): 2 mg via RESPIRATORY_TRACT
  Filled 2022-05-21 (×6): qty 8

## 2022-05-21 MED ORDER — LORATADINE 10 MG PO TABS
10.0000 mg | ORAL_TABLET | Freq: Every day | ORAL | Status: DC
  Administered 2022-05-21 – 2022-05-24 (×3): 10 mg via ORAL
  Filled 2022-05-21 (×4): qty 1

## 2022-05-21 MED ORDER — AZITHROMYCIN 250 MG PO TABS
500.0000 mg | ORAL_TABLET | Freq: Every day | ORAL | Status: DC
  Administered 2022-05-22 – 2022-05-24 (×3): 500 mg via ORAL
  Filled 2022-05-21 (×3): qty 2

## 2022-05-21 NOTE — ED Notes (Signed)
Pt assisted to bedside toilet for bowel movement. Upon returning to bed, pt became hypoxic and O2 sat decreased to 77%. After returning to bed and in stable position, pt's O2 sat returned to 93% on 3L of O2. Provider and RT aware and at bedside.

## 2022-05-21 NOTE — Plan of Care (Signed)
  Problem: Education: Goal: Ability to describe self-care measures that may prevent or decrease complications (Diabetes Survival Skills Education) will improve Outcome: Progressing Goal: Individualized Educational Video(s) Outcome: Progressing   Problem: Coping: Goal: Ability to adjust to condition or change in health will improve Outcome: Progressing   Problem: Fluid Volume: Goal: Ability to maintain a balanced intake and output will improve Outcome: Progressing   Problem: Health Behavior/Discharge Planning: Goal: Ability to identify and utilize available resources and services will improve Outcome: Progressing Goal: Ability to manage health-related needs will improve Outcome: Progressing   Problem: Metabolic: Goal: Ability to maintain appropriate glucose levels will improve Outcome: Progressing   Problem: Nutritional: Goal: Maintenance of adequate nutrition will improve Outcome: Progressing Goal: Progress toward achieving an optimal weight will improve Outcome: Progressing   Problem: Skin Integrity: Goal: Risk for impaired skin integrity will decrease Outcome: Progressing   Problem: Tissue Perfusion: Goal: Adequacy of tissue perfusion will improve Outcome: Progressing   Problem: Education: Goal: Knowledge of General Education information will improve Description: Including pain rating scale, medication(s)/side effects and non-pharmacologic comfort measures Outcome: Progressing   Problem: Health Behavior/Discharge Planning: Goal: Ability to manage health-related needs will improve Outcome: Progressing   Problem: Clinical Measurements: Goal: Ability to maintain clinical measurements within normal limits will improve Outcome: Progressing Goal: Will remain free from infection Outcome: Progressing Goal: Diagnostic test results will improve Outcome: Progressing Goal: Respiratory complications will improve Outcome: Progressing Goal: Cardiovascular complication will  be avoided Outcome: Progressing   Problem: Activity: Goal: Risk for activity intolerance will decrease Outcome: Progressing   Problem: Nutrition: Goal: Adequate nutrition will be maintained Outcome: Progressing   Problem: Coping: Goal: Level of anxiety will decrease Outcome: Progressing   Problem: Elimination: Goal: Will not experience complications related to bowel motility Outcome: Progressing Goal: Will not experience complications related to urinary retention Outcome: Progressing   Problem: Pain Managment: Goal: General experience of comfort will improve Outcome: Progressing   Problem: Safety: Goal: Ability to remain free from injury will improve Outcome: Progressing   Problem: Skin Integrity: Goal: Risk for impaired skin integrity will decrease Outcome: Progressing   Problem: Education: Goal: Ability to demonstrate management of disease process will improve Outcome: Progressing Goal: Ability to verbalize understanding of medication therapies will improve Outcome: Progressing Goal: Individualized Educational Video(s) Outcome: Progressing   Problem: Activity: Goal: Capacity to carry out activities will improve Outcome: Progressing   Problem: Cardiac: Goal: Ability to achieve and maintain adequate cardiopulmonary perfusion will improve Outcome: Progressing   Problem: Education: Goal: Knowledge of disease or condition will improve Outcome: Progressing Goal: Knowledge of the prescribed therapeutic regimen will improve Outcome: Progressing Goal: Individualized Educational Video(s) Outcome: Progressing   Problem: Activity: Goal: Ability to tolerate increased activity will improve Outcome: Progressing Goal: Will verbalize the importance of balancing activity with adequate rest periods Outcome: Progressing   Problem: Respiratory: Goal: Ability to maintain a clear airway will improve Outcome: Progressing Goal: Levels of oxygenation will improve Outcome:  Progressing Goal: Ability to maintain adequate ventilation will improve Outcome: Progressing

## 2022-05-21 NOTE — ED Notes (Signed)
Visual Acuity R:20/30 L:20/25 Both: 20/25 Corrective lenses: reading glasses only.

## 2022-05-21 NOTE — ED Provider Notes (Signed)
[facility] EMERGENCY DEPT Provider Note   CSN: [id number] Arrival date & time: 05/20/22  2225     History  Chief Complaint  Patient presents with   Marletta Lor    DEDRICK HEFFNER is a 86 y.o. male.  The history is provided by the patient.  Fall This is a new problem. The current episode started 1 to 2 hours ago. The problem occurs constantly. The problem has not changed since onset.Pertinent negatives include no chest pain, no abdominal pain, no headaches and no shortness of breath. Nothing aggravates the symptoms. Nothing relieves the symptoms. He has tried nothing for the symptoms. The treatment provided no relief.  Patient with COPD and CHF who fell forward from chair this evening striking face and left arm     Past Medical History:  Diagnosis Date   Arthritis    Asthma    CHF (congestive heart failure) (HCC)    COPD (chronic obstructive pulmonary disease) (HCC)    Diabetes mellitus without complication (HCC)    GERD (gastroesophageal reflux disease)    H/O Graves' disease    H/O wheezing    HOH (hard of hearing)    Hypercholesterolemia    Hypertension    Hypothyroidism    Lower extremity edema    Multiple allergies    Palpitations    Peripheral vascular disease (HCC)    swelling of feet and legs   Pneumonia    in the past   PONV (postoperative nausea and vomiting)    Shortness of breath dyspnea    Sleep apnea    CPAP     Home Medications Prior to Admission medications   Medication Sig Start Date End Date Taking? Authorizing Provider  albuterol (VENTOLIN HFA) 108 (90 Base) MCG/ACT inhaler Inhale 2 puffs into the lungs every 6 (six) hours as needed for wheezing or shortness of breath. 12/30/21   Sallyanne Kuster, NP  aspirin EC 81 MG tablet Take 81 mg by mouth daily.    [provider]  Bromfenac Sodium 0.075 % SOLN Apply 1 drop to eye 2 (two) times daily.    [provider]  carvedilol (COREG) 25 MG tablet Take 25 mg by mouth 2  (two) times daily with a meal.    [provider]  cholecalciferol (VITAMIN D) 1000 units tablet Take 1,000 Units by mouth at bedtime.     [provider]  Difluprednate 0.05 % EMUL Apply 1 drop to eye 2 (two) times daily.    [provider]  ergocalciferol (VITAMIN D2) 50000 units capsule Take 50,000 Units by mouth once a week.    [provider]  Exenatide ER 2 MG PEN Inject into the skin once a week.    [provider]  fexofenadine (ALLEGRA) 180 MG tablet Take 180 mg by mouth daily.    [provider]  Fluticasone-Umeclidin-Vilant (TRELEGY ELLIPTA) 100-62.5-25 MCG/ACT AEPB Inhale 1 puff into the lungs daily. 10/12/21   Sallyanne Kuster, NP  furosemide (LASIX) 20 MG tablet Take 20 mg by mouth 2 (two) times daily.    [provider]  gabapentin (NEURONTIN) 600 MG tablet Take 600 mg by mouth 2 (two) times daily.    [provider]  glipiZIDE (GLUCOTROL) 5 MG tablet Take 5 mg by mouth daily before breakfast.    [provider]  ipratropium-albuterol (DUONEB) 0.5-2.5 (3) MG/3ML SOLN Take 3 mLs by nebulization every 4 (four) hours as needed. [id number] 04/15/21   Sallyanne Kuster, NP  JARDIANCE 10 MG  TABS tablet Take 10 mg by mouth daily. 08/16/19   [provider]  levothyroxine (SYNTHROID, LEVOTHROID) 137 MCG tablet Take 137 mcg by mouth daily before breakfast.    [provider]  magnesium oxide (MAG-OX) 400 MG tablet Take 400 mg by mouth 2 (two) times daily.    [provider]  montelukast (SINGULAIR) 10 MG tablet Take 10 mg by mouth at bedtime.    [provider]  nepafenac (ILEVRO) 0.3 % ophthalmic suspension 1 drop at bedtime.     [provider]  OXYGEN Inhale into the lungs. 2 litre at night    [provider]  pravastatin (PRAVACHOL) 20 MG tablet Take 20 mg by mouth at bedtime.    [provider]  predniSONE (DELTASONE) 10 MG tablet Take 1 tablet (10 mg  total) by mouth daily with breakfast. 12/14/21   Sallyanne KusterAbernathy, Alyssa, NP  predniSONE (STERAPRED UNI-PAK 48 TAB) 10 MG (48) TBPK tablet Use as directed. 12/30/21   Sallyanne KusterAbernathy, Alyssa, NP  quinapril (ACCUPRIL) 40 MG tablet Take 20 mg by mouth at bedtime.    [provider]  ranitidine (ZANTAC) 150 MG tablet Take 150 mg by mouth at bedtime.    [provider]  Roflumilast 250 MCG TABS TAKE 1 TABLET BY MOUTH EVERY DAY 05/14/22   Sallyanne KusterAbernathy, Alyssa, NP  tamsulosin (FLOMAX) 0.4 MG CAPS capsule Take 1 capsule (0.4 mg total) by mouth daily after supper. 05/13/21   Gwyneth SproutPlunkett, Whitney, MD  Travoprost, BAK Free, (TRAVATAN) 0.004 % SOLN ophthalmic solution 1 drop at bedtime.    [provider]  YUPELRI 175 MCG/3ML nebulizer solution Inhale one vial in nebulizer once daily. Do not mix with other nebulized medications. 03/15/22   Lyndon CodeKhan, Fozia M, MD      Allergies    Lodine [etodolac] and Cefaclor    Review of Systems   Review of Systems  Constitutional:  Negative for fever.  Respiratory:  Negative for shortness of breath.   Cardiovascular:  Negative for chest pain.  Gastrointestinal:  Negative for abdominal pain.  Neurological:  Negative for headaches.  All other systems reviewed and are negative.   Physical Exam Updated Vital Signs BP 129/69   Pulse 84   Temp 98.2 F (36.8 C)   Resp 18   Ht 5\' 10"  (1.778 m)   Wt 104.8 kg   SpO2 97%   BMI 33.15 kg/m  Physical Exam Constitutional:      General: He is not in acute distress.    Appearance: Normal appearance. He is well-developed. He is not diaphoretic.  HENT:     Head: Normocephalic.      Right Ear: Tympanic membrane normal.     Left Ear: Tympanic membrane normal.     Nose: Nose normal.  Eyes:     Conjunctiva/sclera:     Right eye: Hemorrhage present. No chemosis.    Left eye: Hemorrhage present. No chemosis.    Pupils: Pupils are equal, round, and reactive to light.      Comments: B exophthalmos   Cardiovascular:      Rate and Rhythm: Normal rate and regular rhythm.     Pulses: Normal pulses.     Heart sounds: Normal heart sounds.  Pulmonary:     Effort: Pulmonary effort is normal.     Breath sounds: Rales present. No wheezing.  Abdominal:     General: Bowel sounds are normal.     Palpations: Abdomen is soft.     Tenderness: There is  no abdominal tenderness. There is no guarding or rebound.  Musculoskeletal:        General: Normal range of motion.       Arms:     Cervical back: Normal range of motion and neck supple.     Right lower leg: Edema present.     Left lower leg: Edema present.  Skin:    General: Skin is warm and dry.     Capillary Refill: Capillary refill takes less than 2 seconds.  Neurological:     Mental Status: He is alert.     ED Results / Procedures / Treatments   Labs (all labs ordered are listed, but only abnormal results are displayed) Results for orders placed or performed during the hospital encounter of 05/20/22  CBC with Differential  Result Value Ref Range   WBC 9.7 4.0 - 10.5 K/uL   RBC 3.70 (L) 4.22 - 5.81 MIL/uL   Hemoglobin 11.9 (L) 13.0 - 17.0 g/dL   HCT 16.1 (L) 09.6 - 04.5 %   MCV 100.0 80.0 - 100.0 fL   MCH 32.2 26.0 - 34.0 pg   MCHC 32.2 30.0 - 36.0 g/dL   RDW 40.9 81.1 - 91.4 %   Platelets 218 150 - 400 K/uL   nRBC 0.0 0.0 - 0.2 %   Neutrophils Relative % 74 %   Neutro Abs 7.2 1.7 - 7.7 K/uL   Lymphocytes Relative 18 %   Lymphs Abs 1.7 0.7 - 4.0 K/uL   Monocytes Relative 7 %   Monocytes Absolute 0.7 0.1 - 1.0 K/uL   Eosinophils Relative 0 %   Eosinophils Absolute 0.0 0.0 - 0.5 K/uL   Basophils Relative 0 %   Basophils Absolute 0.0 0.0 - 0.1 K/uL   Immature Granulocytes 1 %   Abs Immature Granulocytes 0.05 0.00 - 0.07 K/uL  Basic metabolic panel  Result Value Ref Range   Sodium 134 (L) 135 - 145 mmol/L   Potassium 4.5 3.5 - 5.1 mmol/L   Chloride 91 (L) 98 - 111 mmol/L   CO2 33 (H) 22 - 32 mmol/L   Glucose, Bld 204 (H) 70 - 99 mg/dL    BUN 27 (H) 8 - 23 mg/dL   Creatinine, Ser 7.82 (H) 0.61 - 1.24 mg/dL   Calcium 9.1 8.9 - 95.6 mg/dL   GFR, Estimated 48 (L) >60 mL/min   Anion gap 10 5 - 15  Brain natriuretic peptide  Result Value Ref Range   B Natriuretic Peptide 149.8 (H) 0.0 - 100.0 pg/mL   CT Orbits W Contrast  Result Date: 05/21/2022 CLINICAL DATA:  Fall with ocular pain. EXAM: CT ORBITS WITH CONTRAST TECHNIQUE: Multidetector CT images was performed according to the standard protocol following intravenous contrast administration. RADIATION DOSE REDUCTION: This exam was performed according to the departmental dose-optimization program which includes automated exposure control, adjustment of the mA and/or kV according to patient size and/or use of iterative reconstruction technique. CONTRAST:  75mL OMNIPAQUE IOHEXOL 300 MG/ML  SOLN COMPARISON:  None Available. FINDINGS: Orbits: The globes are intact. Optic nerves are normal. Normal extraocular muscles and lacrimal glands. The bony orbit, preseptal soft tissues and the intra- and extraconal orbital fat are normal. Visualized sinuses:  No fluid levels or advanced mucosal thickening. Soft tissues: Left supraorbital scalp laceration. Limited intracranial: Normal.  Intracranial ICA atherosclerosis. IMPRESSION: Left supraorbital scalp laceration without orbital injury. Electronically Signed   By: Deatra Robinson M.D.   On: 05/21/2022 02:29   CT Maxillofacial Wo Contrast  Result Date: 05/21/2022 CLINICAL DATA:  Facial trauma, blunt. Fell at home. Laceration over the left eye. EXAM: CT MAXILLOFACIAL WITHOUT CONTRAST TECHNIQUE: Multidetector CT imaging of the maxillofacial structures was performed. Multiplanar CT image reconstructions were also generated. RADIATION DOSE REDUCTION: This exam was performed according to the departmental dose-optimization program which includes automated exposure control, adjustment of the mA and/or kV according to patient size and/or use of iterative  reconstruction technique. COMPARISON:  CT head 08/01/2013 FINDINGS: Osseous: Motion artifact limits evaluation. As visualized, the nasal bones, orbital bones, facial bones, and mandibles appear intact. No acute displaced fractures are identified. Orbits: Configuration of the orbits suggest exophthalmos bilaterally. Correlate with physical examination. The globes appear intact. Extraocular muscles appear somewhat thickened. Consider thyroid ophthalmopathy. No retrobulbar soft tissue infiltration. Sinuses: Paranasal sinuses and mastoid air cells are clear. Soft tissues: Left periorbital soft tissue hematoma. No retro bulbar or intraconal involvement. Small amounts of soft tissue gas are demonstrated inferior to the right globe, possibly indicating penetrating injury or infection. Correlation with physical examination is recommended. Limited intracranial: No acute abnormality. IMPRESSION: 1. No acute displaced orbital or facial fractures identified. 2. Left periorbital soft tissue hematoma. No retrobulbar involvement. 3. Tiny gas demonstrated inferior to the right orbit may represent normal variation but could indicate penetrating injury or infection. Correlate with physical examination. 4. Suggestion of exophthalmos with thickened extraocular muscles. Correlate with physical examination. Consider thyroid ophthalmopathy. Electronically Signed   By: Burman Nieves M.D.   On: 05/21/2022 00:01   CT Cervical Spine Wo Contrast  Result Date: 05/20/2022 CLINICAL DATA:  Neck trauma after a fall. EXAM: CT CERVICAL SPINE WITHOUT CONTRAST TECHNIQUE: Multidetector CT imaging of the cervical spine was performed without intravenous contrast. Multiplanar CT image reconstructions were also generated. RADIATION DOSE REDUCTION: This exam was performed according to the departmental dose-optimization program which includes automated exposure control, adjustment of the mA and/or kV according to patient size and/or use of iterative  reconstruction technique. COMPARISON:  None Available. FINDINGS: Alignment: Straightening of usual cervical lordosis. This is likely positional or degenerative but could indicate muscle spasm. No anterior subluxations. Normal alignment of the facet joints. Skull base and vertebrae: The skull base appears intact. No vertebral compression deformities. No focal bone lesion or bone destruction. Bone cortex appears intact. Soft tissues and spinal canal: No prevertebral soft tissue swelling. No abnormal paraspinal soft tissue mass or infiltration. Disc levels: Degenerative changes throughout with disc space narrowing and prominent endplate osteophyte formation. Degenerative changes throughout the posterior facet joints. Uncovertebral and facet joint spurring causes bone encroachment upon neural foramina bilaterally. Upper chest: Emphysematous changes and fibrosis in the lung apices. Other: Vascular calcification in the cervical carotid arteries with suggestion of at least moderate stenosis of bilateral distal common carotid and proximal internal carotid arteries. IMPRESSION: 1. Nonspecific straightening of usual cervical lordosis. No acute displaced fractures identified. 2. Prominent degenerative changes in the cervical spine. 3. Prominent vascular calcification in the cervical carotid arteries. Electronically Signed   By: Burman Nieves M.D.   On: 05/20/2022 23:56   CT Head Wo Contrast  Result Date: 05/20/2022 CLINICAL DATA:  Head trauma, moderate to severe. Fall. No loss of consciousness. EXAM: CT HEAD WITHOUT CONTRAST TECHNIQUE: Contiguous axial images were obtained from the base of the skull through the vertex without intravenous contrast. RADIATION DOSE REDUCTION: This exam was performed according to the departmental dose-optimization program which includes automated exposure control, adjustment of the mA and/or kV according to patient size and/or use of iterative  reconstruction technique. COMPARISON:  CT head  08/01/2013.  MRI 08/01/2013 FINDINGS: Brain: Diffuse cerebral atrophy. Ventricular dilatation consistent with central atrophy. Low-attenuation changes in the deep white matter consistent with small vessel ischemia. No abnormal extra-axial fluid collections. No mass effect or midline shift. Gray-white matter junctions are distinct. Basal cisterns are not effaced. No acute intracranial hemorrhage. Vascular: Intracranial vascular calcifications. Skull: Normal. Negative for fracture or focal lesion. Sinuses/Orbits: Paranasal sinuses and mastoid air cells are clear. Other: Subcutaneous scalp laceration over the left supraorbital region. Left periorbital soft tissue hematoma. IMPRESSION: 1. No acute intracranial abnormalities. 2. Diffuse atrophy and small vessel ischemic changes. Electronically Signed   By: Burman Nieves M.D.   On: 05/20/2022 23:53   DG Chest 2 View  Result Date: 05/20/2022 CLINICAL DATA:  Fall.  History of COPD. EXAM: CHEST - 2 VIEW COMPARISON:  12/09/2021 FINDINGS: The heart is enlarged. There are bilateral pleural effusions and fluid in the fissures. Peribronchial thickening which appears similar to prior exam and may be chronic. No pneumothorax. No evidence of acute airspace disease. IMPRESSION: 1. Cardiomegaly with bilateral pleural effusions and fluid in the fissures, suspicious for CHF. 2. Peribronchial thickening appears similar to prior exam and may be chronic, either pulmonary edema or related COPD. Electronically Signed   By: Narda Rutherford M.D.   On: 05/20/2022 23:38   DG Forearm Left  Result Date: 05/20/2022 CLINICAL DATA:  Fall. Left forearm laceration. EXAM: LEFT FOREARM - 2 VIEW COMPARISON:  None Available. FINDINGS: The cortical margins of the radius and ulna are intact. There is no evidence of fracture or other focal bone lesions. Elbow alignment is maintained with mild degenerative spurring. Mild soft tissue edema. IMPRESSION: No fracture of the left forearm. Mild soft  tissue edema. Electronically Signed   By: Narda Rutherford M.D.   On: 05/20/2022 23:36   DG Wrist Complete Left  Result Date: 05/20/2022 CLINICAL DATA:  Status post fall, left hand skin tear. EXAM: LEFT WRIST - COMPLETE 3+ VIEW COMPARISON:  None Available. FINDINGS: There is no evidence of fracture or dislocation. 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 Dorsal soft tissue edema overlies the metacarpals. Mild soft tissue edema about the wrist. IMPRESSION: 1. Dorsal soft tissue edema without acute fracture or dislocation. 2. 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 Electronically Signed   By: Narda Rutherford M.D.   On: 05/20/2022 23:29     EKG EKG Interpretation  Date/Time:  Friday May 21 2022 00:54:05 EST Ventricular Rate:  84 PR Interval:  181 QRS Duration: 156 QT Interval:  428 QTC Calculation: 506 R Axis:   39 Text Interpretation: Sinus rhythm Consider left atrial enlargement IVCD, consider atypical LBBB Confirmed by Nicanor Alcon, Alishea Beaudin ([id number]) on 05/21/2022 1:00:13 AM  Radiology CT Orbits W Contrast  Result Date: 05/21/2022 CLINICAL DATA:  Fall with ocular pain. EXAM: CT ORBITS WITH CONTRAST TECHNIQUE: Multidetector CT images was performed according to the standard protocol following intravenous contrast administration. RADIATION DOSE REDUCTION: This exam was performed according to the departmental dose-optimization program which includes automated exposure control, adjustment of the mA and/or kV according to patient size and/or use of iterative reconstruction technique. CONTRAST:  75mL OMNIPAQUE IOHEXOL 300 MG/ML  SOLN COMPARISON:  None Available. FINDINGS: Orbits: The globes are intact. Optic nerves are normal. Normal extraocular muscles and lacrimal glands. The bony orbit, preseptal soft tissues and the intra- and extraconal orbital fat are normal. Visualized sinuses:  No fluid levels or  advanced mucosal  thickening. Soft tissues: Left supraorbital scalp laceration. Limited intracranial: Normal.  Intracranial ICA atherosclerosis. IMPRESSION: Left supraorbital scalp laceration without orbital injury. Electronically Signed   By: Deatra Robinson M.D.   On: 05/21/2022 02:29   CT Maxillofacial Wo Contrast  Result Date: 05/21/2022 CLINICAL DATA:  Facial trauma, blunt. Fell at home. Laceration over the left eye. EXAM: CT MAXILLOFACIAL WITHOUT CONTRAST TECHNIQUE: Multidetector CT imaging of the maxillofacial structures was performed. Multiplanar CT image reconstructions were also generated. RADIATION DOSE REDUCTION: This exam was performed according to the departmental dose-optimization program which includes automated exposure control, adjustment of the mA and/or kV according to patient size and/or use of iterative reconstruction technique. COMPARISON:  CT head 08/01/2013 FINDINGS: Osseous: Motion artifact limits evaluation. As visualized, the nasal bones, orbital bones, facial bones, and mandibles appear intact. No acute displaced fractures are identified. Orbits: Configuration of the orbits suggest exophthalmos bilaterally. Correlate with physical examination. The globes appear intact. Extraocular muscles appear somewhat thickened. Consider thyroid ophthalmopathy. No retrobulbar soft tissue infiltration. Sinuses: Paranasal sinuses and mastoid air cells are clear. Soft tissues: Left periorbital soft tissue hematoma. No retro bulbar or intraconal involvement. Small amounts of soft tissue gas are demonstrated inferior to the right globe, possibly indicating penetrating injury or infection. Correlation with physical examination is recommended. Limited intracranial: No acute abnormality. IMPRESSION: 1. No acute displaced orbital or facial fractures identified. 2. Left periorbital soft tissue hematoma. No retrobulbar involvement. 3. Tiny gas demonstrated inferior to the right orbit may represent normal  variation but could indicate penetrating injury or infection. Correlate with physical examination. 4. Suggestion of exophthalmos with thickened extraocular muscles. Correlate with physical examination. Consider thyroid ophthalmopathy. Electronically Signed   By: Burman Nieves M.D.   On: 05/21/2022 00:01   CT Cervical Spine Wo Contrast  Result Date: 05/20/2022 CLINICAL DATA:  Neck trauma after a fall. EXAM: CT CERVICAL SPINE WITHOUT CONTRAST TECHNIQUE: Multidetector CT imaging of the cervical spine was performed without intravenous contrast. Multiplanar CT image reconstructions were also generated. RADIATION DOSE REDUCTION: This exam was performed according to the departmental dose-optimization program which includes automated exposure control, adjustment of the mA and/or kV according to patient size and/or use of iterative reconstruction technique. COMPARISON:  None Available. FINDINGS: Alignment: Straightening of usual cervical lordosis. This is likely positional or degenerative but could indicate muscle spasm. No anterior subluxations. Normal alignment of the facet joints. Skull base and vertebrae: The skull base appears intact. No vertebral compression deformities. No focal bone lesion or bone destruction. Bone cortex appears intact. Soft tissues and spinal canal: No prevertebral soft tissue swelling. No abnormal paraspinal soft tissue mass or infiltration. Disc levels: Degenerative changes throughout with disc space narrowing and prominent endplate osteophyte formation. Degenerative changes throughout the posterior facet joints. Uncovertebral and facet joint spurring causes bone encroachment upon neural foramina bilaterally. Upper chest: Emphysematous changes and fibrosis in the lung apices. Other: Vascular calcification in the cervical carotid arteries with suggestion of at least moderate stenosis of bilateral distal common carotid and proximal internal carotid arteries. IMPRESSION: 1. Nonspecific  straightening of usual cervical lordosis. No acute displaced fractures identified. 2. Prominent degenerative changes in the cervical spine. 3. Prominent vascular calcification in the cervical carotid arteries. Electronically Signed   By: Burman Nieves M.D.   On: 05/20/2022 23:56   CT Head Wo Contrast  Result Date: 05/20/2022 CLINICAL DATA:  Head trauma, moderate to severe. Fall. No loss of consciousness. EXAM: CT HEAD WITHOUT CONTRAST TECHNIQUE: Contiguous axial images were obtained from  the base of the skull through the vertex without intravenous contrast. RADIATION DOSE REDUCTION: This exam was performed according to the departmental dose-optimization program which includes automated exposure control, adjustment of the mA and/or kV according to patient size and/or use of iterative reconstruction technique. COMPARISON:  CT head 08/01/2013.  MRI 08/01/2013 FINDINGS: Brain: Diffuse cerebral atrophy. Ventricular dilatation consistent with central atrophy. Low-attenuation changes in the deep white matter consistent with small vessel ischemia. No abnormal extra-axial fluid collections. No mass effect or midline shift. Gray-white matter junctions are distinct. Basal cisterns are not effaced. No acute intracranial hemorrhage. Vascular: Intracranial vascular calcifications. Skull: Normal. Negative for fracture or focal lesion. Sinuses/Orbits: Paranasal sinuses and mastoid air cells are clear. Other: Subcutaneous scalp laceration over the left supraorbital region. Left periorbital soft tissue hematoma. IMPRESSION: 1. No acute intracranial abnormalities. 2. Diffuse atrophy and small vessel ischemic changes. Electronically Signed   By: Burman Nieves M.D.   On: 05/20/2022 23:53   DG Chest 2 View  Result Date: 05/20/2022 CLINICAL DATA:  Fall.  History of COPD. EXAM: CHEST - 2 VIEW COMPARISON:  12/09/2021 FINDINGS: The heart is enlarged. There are bilateral pleural effusions and fluid in the fissures.  Peribronchial thickening which appears similar to prior exam and may be chronic. No pneumothorax. No evidence of acute airspace disease. IMPRESSION: 1. Cardiomegaly with bilateral pleural effusions and fluid in the fissures, suspicious for CHF. 2. Peribronchial thickening appears similar to prior exam and may be chronic, either pulmonary edema or related COPD. Electronically Signed   By: Narda Rutherford M.D.   On: 05/20/2022 23:38   DG Forearm Left  Result Date: 05/20/2022 CLINICAL DATA:  Fall. Left forearm laceration. EXAM: LEFT FOREARM - 2 VIEW COMPARISON:  None Available. FINDINGS: The cortical margins of the radius and ulna are intact. There is no evidence of fracture or other focal bone lesions. Elbow alignment is maintained with mild degenerative spurring. Mild soft tissue edema. IMPRESSION: No fracture of the left forearm. Mild soft tissue edema. Electronically Signed   By: Narda Rutherford M.D.   On: 05/20/2022 23:36   DG Wrist Complete Left  Result Date: 05/20/2022 CLINICAL DATA:  Status post fall, left hand skin tear. EXAM: LEFT WRIST - COMPLETE 3+ VIEW COMPARISON:  None Available. FINDINGS: There is no evidence of fracture or dislocation. 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 Dorsal soft tissue edema overlies the metacarpals. Mild soft tissue edema about the wrist. IMPRESSION: 1. Dorsal soft tissue edema without acute fracture or dislocation. 2. 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 Electronically Signed   By: Narda Rutherford M.D.   On: 05/20/2022 23:29    Procedures .Marland KitchenLaceration Repair  Date/Time: 05/21/2022 4:30 AM  Performed by: Cy Blamer, MD Authorized by: Cy Blamer, MD   Consent:    Consent obtained:  Verbal   Consent given by:  Patient   Risks discussed:  Infection, need for additional repair, nerve damage, pain, poor cosmetic result, poor wound healing, retained  foreign body, tendon damage and vascular damage   Alternatives discussed:  No treatment Universal protocol:    Patient identity confirmed:  Arm band Anesthesia:    Anesthesia method:  Topical application and local infiltration   Topical anesthetic:  LET   Local anesthetic:  Lidocaine 1% WITH epi Laceration details:    Location:  Face   Face location:  Forehead   Length (cm):  3   Depth (mm):  1 Pre-procedure details:  Preparation:  Patient was prepped and draped in usual sterile fashion Exploration:    Hemostasis achieved with:  Direct pressure   Imaging outcome: foreign body not noted     Wound extent: fascia not violated, no foreign body and no signs of injury   Treatment:    Area cleansed with:  Povidone-iodine, chlorhexidine and saline   Amount of cleaning:  Extensive   Irrigation solution:  Sterile saline   Debridement:  None   Undermining:  None   Scar revision: no   Skin repair:    Repair method:  Sutures   Suture size:  6-0   Suture material:  Nylon   Suture technique:  Simple interrupted   Number of sutures:  10 Approximation:    Approximation:  Close Repair type:    Repair type:  Simple Post-procedure details:    Dressing:  Sterile dressing   Procedure completion:  Tolerated well, no immediate complications .Marland KitchenLaceration Repair  Date/Time: 05/21/2022 4:32 AM  Performed by: Cy Blamer, MD Authorized by: Cy Blamer, MD   Consent:    Consent obtained:  Verbal   Consent given by:  Patient   Risks discussed:  Infection, need for additional repair, nerve damage, pain, poor cosmetic result, poor wound healing, retained foreign body, tendon damage and vascular damage   Alternatives discussed:  No treatment Universal protocol:    Patient identity confirmed:  Arm band Anesthesia:    Anesthesia method:  Topical application   Topical anesthetic:  LET Laceration details:    Location: forearm.   Length (cm):  7.8   Depth (mm):  1 Pre-procedure details:     Preparation:  Patient was prepped and draped in usual sterile fashion Exploration:    Limited defect created (wound extended): no     Hemostasis achieved with:  Direct pressure   Wound extent: fascia not violated, no foreign body and no signs of injury   Treatment:    Area cleansed with:  Povidone-iodine, chlorhexidine and saline   Amount of cleaning:  Extensive   Irrigation solution:  Sterile saline   Debridement:  None   Undermining:  None   Scar revision: no   Skin repair:    Repair method:  Staples   Number of staples:  8 Approximation:    Approximation:  Close Repair type:    Repair type:  Intermediate Post-procedure details:    Dressing:  Sterile dressing   Procedure completion:  Tolerated well, no immediate complications     Medications Ordered in ED Medications  erythromycin ophthalmic ointment (has no administration in time range)  lidocaine-EPINEPHrine-tetracaine (LET) topical gel (6 mLs Topical Given 05/21/22 0004)  lidocaine-EPINEPHrine (XYLOCAINE W/EPI) 2 %-1:100000 (with pres) injection 20 mL (20 mLs Infiltration Given by Other 05/21/22 0120)  Tdap (BOOSTRIX) injection 0.5 mL (0.5 mLs Intramuscular Given 05/21/22 0005)  furosemide (LASIX) injection 40 mg (40 mg Intravenous Given 05/21/22 0050)  fluorescein ophthalmic strip 1 strip (1 strip Both Eyes Given 05/21/22 0106)  tetracaine (PONTOCAINE) 0.5 % ophthalmic solution 2 drop (2 drops Both Eyes Given by Other 05/21/22 0120)  fluorescein ophthalmic strip 1 strip (1 strip Both Eyes Given 05/21/22 0106)  iohexol (OMNIPAQUE) 300 MG/ML solution 100 mL (75 mLs Intravenous Contrast Given 05/21/22 0154)    ED Course/ Medical Decision Making/ A&P                           Medical Decision Making Patient had fall on aspirin with multiple  lacerations after falling forward from a chair on ASA  Problems Addressed: Acute congestive heart failure, unspecified heart failure type [facility]):    Details: Lasix initiated in  the ED admit  Corneal abrasion, unspecified laterality, initial encounter:    Details: Emycin ointment initiated   Amount and/or Complexity of Data Reviewed Independent Historian: spouse    Details: See above  Labs: ordered.    Details: All labs reviewed: normal white count 9.7, low hemoglobin 11.9 normal platelet count.  Sodium 134 slight low, potassium 4.5 normal creatinine 1.45 elevated and up from patient's baseline.  BNP 149.8 elevated  Radiology: ordered and independent interpretation performed.    Details: Negative CT head and C spine.  No forearm fractures.  CXR with effusions and edema but me  Discussion of management or test interpretation with external provider(s): Case d/w Dr. Joneen Roach of hospitalist who will admit   Case d/w Dr. Charlotte Sanes of ophthalmology, lacrilube QID to keep eye lubricated.  If not available, erythromycin ointment   Risk Prescription drug management. Decision regarding hospitalization. Risk Details: Pulmonary edema.  Have started treatment in the ED.  Discussed care with ophthalmology.  Sutures to the face will need to be removed in 5 days.  Staples to the air will need to be removed in 12 days.  Neither wound can be submerged for 2 weeks.  This information was verbally given to patient and wife who verbalize understanding     Final Clinical Impression(s) / ED Diagnoses Final diagnoses:  Acute congestive heart failure, unspecified heart failure type (HCC)  Laceration of multiple sites of left upper extremity, initial encounter  Skin tear of left forearm without complication, initial encounter  Subconjunctival hemorrhage of both eyes  Corneal abrasion, unspecified laterality, initial encounter  AKI (acute kidney injury) (HCC)  The patient appears reasonably stabilized for admission considering the current resources, flow, and capabilities available in the ED at this time, and I doubt any other [facility] requiring further screening and/or treatment in the ED prior  to admission.  Rx / DC Orders ED Discharge Orders     None         Berdene Askari, MD 05/21/22 [id number]

## 2022-05-21 NOTE — Progress Notes (Addendum)
This is a 86 year old male with past medical history of  CHF, COPD, DM type 2, hypothyroidism (history of Graves), HTN, arthritis, GERD, BPH, dyslipidemia, orbital decompression, B/L cataract extraction with phaco. On 2L continuous oxygen, trilogy.   He was sent to [facility] ER after he fell face forward and hit his head/face on his cane from a seated position.  He had leaned over to pick up an object on the floor.  There is no report of LOC. In the ER the patient was found to be in florid pulmonary edema, he was hypoxic on 3 L.  He was given Lasix 40 mg IV once. Currently satting 97% on 3 L.  The patient has B/L subconjunctival hemorrhage, facial lacerations and bruising secondary to fall and aspirin.  He has lacerations along the face which were sutured.  Patient CT/head and neck without fractures.  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  He is dry eyes.  CT orbit done, no orbital fractures.  Fluorescein exam done.  Tetracaine drops placed.  Tetanus booster given  EDP touching bases with ophthalmology

## 2022-05-21 NOTE — H&P (Signed)
History and Physical    [name] MWN:[id number] DOB: 04-30-1936 DOA: 05/20/2022  PCP: Alan Mulder, MD (Confirm with patient/family/NH records and if not entered, this has to be entered at [facility] point of entry) Patient coming from: Home  I have personally briefly reviewed patient's old medical records in [facility]  Chief Complaint: Feeling better  HPI: [name] is a 86 y.o. male with medical history significant of advanced COPD, chronic hypoxic respiratory failure on 3 L home O2 continuously, chronic HFpEF, IIDM, PVD, diabetic neuropathy, Graves' disease with Graves' ophthalmopathy status post orbital decompression and eye lid reconstruction, hypothyroidism, presented with fall, and increasing shortness of breath and productive cough for 1 week.  Patient reported he lost balance about bending down to reach something on the ground and fell forward and hit left forehead and left eye.  Denies any LOC, denies any numbness weakness of the limbs of the limbs.  Recently, last 1 to 2 weeks, patient has developed increasing productive cough with clear phlegm and shortness of breath.  Minimum movement within the house trigger significant shortness of breath.  History of advanced COPD year, on home oxygen and Trilogy, and chronic steroid.  He denies any double vision blurry vision or pain associated with eye.  Denies any any chest pain, no fever or chills.  ED Course: In [place] ED, patient was found to be mild hypoxic with O2 saturation dropped on minimum activity and received 40 mg IV Lasix.  Physical exam showed patient has bilateral subconjunctival hemorrhage facial laceration and bruising on the left forehead.  CT head and neck negative for acute findings.  CT orbital showed no fracture, fluorescein exam done showed no corneal damage.  Chest x-ray however showed cardiomegaly and pulmonary congestion.  Review of Systems: As per HPI otherwise 14 point review of systems negative.     Past Medical History:  Diagnosis Date   Arthritis    Asthma    CHF (congestive heart failure) (HCC)    COPD (chronic obstructive pulmonary disease) (HCC)    Diabetes mellitus without complication (HCC)    GERD (gastroesophageal reflux disease)    H/O Graves' disease    H/O wheezing    HOH (hard of hearing)    Hypercholesterolemia    Hypertension    Hypothyroidism    Lower extremity edema    Multiple allergies    Palpitations    Peripheral vascular disease (HCC)    swelling of feet and legs   Pneumonia    in the past   PONV (postoperative nausea and vomiting)    Shortness of breath dyspnea    Sleep apnea    CPAP    Past Surgical History:  Procedure Laterality Date   CARDIAC CATHETERIZATION     CATARACT EXTRACTION W/PHACO Right 09/01/2015   Procedure: CATARACT EXTRACTION PHACO AND INTRAOCULAR LENS PLACEMENT (IOC);  Surgeon: Sallee Lange, MD;  Location: [facility] ORS;  Service: Ophthalmology;  Laterality: Right;  [place] 01:33 AP% 24.9 CDE 42.99 fluid pack lot # [id number]   CATARACT EXTRACTION W/PHACO Left 09/22/2015   Procedure: CATARACT EXTRACTION PHACO AND INTRAOCULAR LENS PLACEMENT (IOC);  Surgeon: Sallee Lange, MD;  Location: [facility] ORS;  Service: Ophthalmology;  Laterality: Left;  [place] 01:28 AP% 20.9 CDE 29.54 fluid pack lot # [id number]   CHOLECYSTECTOMY     EYE SURGERY     Orbital Decompression     TONSILLECTOMY       reports that he has quit smoking. He has never used  smokeless tobacco. He reports current alcohol use. He reports that he does not use drugs.  Allergies  Allergen Reactions   Lodine [Etodolac] Itching   Cefaclor Rash    Family History  Problem Relation Age of Onset   Diabetes Father      Prior to Admission medications   Medication Sig Start Date End Date Taking? Authorizing Provider  albuterol (VENTOLIN HFA) 108 (90 Base) MCG/ACT inhaler Inhale 2 puffs into the lungs every 6 (six) hours as needed for wheezing or shortness of breath.  12/30/21   Sallyanne Kuster, NP  aspirin EC 81 MG tablet Take 81 mg by mouth daily.    [provider]  brimonidine (ALPHAGAN) 0.2 % ophthalmic solution Place 1 drop into the right eye 2 (two) times daily. 05/10/22   [provider]  Bromfenac Sodium 0.075 % SOLN Apply 1 drop to eye 2 (two) times daily.    [provider]  carvedilol (COREG) 25 MG tablet Take 25 mg by mouth 2 (two) times daily with a meal.    [provider]  cholecalciferol (VITAMIN D) 1000 units tablet Take 1,000 Units by mouth at bedtime.     [provider]  citalopram (CELEXA) 40 MG tablet Take 40 mg by mouth daily. 05/04/22   [provider]  Difluprednate 0.05 % EMUL Apply 1 drop to eye 2 (two) times daily.    [provider]  ergocalciferol (VITAMIN D2) 50000 units capsule Take 50,000 Units by mouth once a week.    [provider]  Exenatide ER 2 MG PEN Inject into the skin once a week.    [provider]  fexofenadine (ALLEGRA) 180 MG tablet Take 180 mg by mouth daily.    [provider]  Fluticasone-Umeclidin-Vilant (TRELEGY ELLIPTA) 100-62.5-25 MCG/ACT AEPB Inhale 1 puff into the lungs daily. 10/12/21   Sallyanne Kuster, NP  furosemide (LASIX) 20 MG tablet Take 20 mg by mouth 2 (two) times daily.    [provider]  gabapentin (NEURONTIN) 600 MG tablet Take 600 mg by mouth 2 (two) times daily.    [provider]  glipiZIDE (GLUCOTROL) 5 MG tablet Take 5 mg by mouth daily before breakfast.    [provider]  ipratropium-albuterol (DUONEB) 0.5-2.5 (3) MG/3ML SOLN Take 3 mLs by nebulization every 4 (four) hours as needed. [id number] 04/15/21   Sallyanne Kuster, NP  JARDIANCE 10 MG TABS tablet Take 10 mg by mouth daily. 08/16/19   [provider]  levothyroxine (SYNTHROID, LEVOTHROID) 137 MCG tablet Take 137 mcg by mouth daily before breakfast.    [provider]  liothyronine (CYTOMEL) 5 MCG  tablet Take 5 mcg by mouth daily. 04/16/22   [provider]  LUMIGAN 0.01 % SOLN SMARTSIG:In Eye(s) 05/10/22   [provider]  magnesium oxide (MAG-OX) 400 MG tablet Take 400 mg by mouth 2 (two) times daily.    [provider]  montelukast (SINGULAIR) 10 MG tablet Take 10 mg by mouth at bedtime.    [provider]  nepafenac (ILEVRO) 0.3 % ophthalmic suspension 1 drop at bedtime.     [provider]  [facility] ULTRA test strip USE AS DIRECTED TO CHECK BLOOD GLUCOSE EVERY DAY 04/16/22   [provider]  OXYGEN Inhale into the lungs. 2 litre at night    [provider]  pravastatin (PRAVACHOL) 20 MG tablet Take 20 mg by mouth at bedtime.    [provider]  predniSONE (DELTASONE) 10 MG tablet  Take 1 tablet (10 mg total) by mouth daily with breakfast. 12/14/21   Sallyanne KusterAbernathy, Alyssa, NP  predniSONE (STERAPRED UNI-PAK 48 TAB) 10 MG (48) TBPK tablet Use as directed. 12/30/21   Sallyanne KusterAbernathy, Alyssa, NP  quinapril (ACCUPRIL) 40 MG tablet Take 20 mg by mouth at bedtime.    [provider]  ranitidine (ZANTAC) 150 MG tablet Take 150 mg by mouth at bedtime.    [provider]  Roflumilast 250 MCG TABS TAKE 1 TABLET BY MOUTH EVERY DAY 05/14/22   Sallyanne KusterAbernathy, Alyssa, NP  tamsulosin (FLOMAX) 0.4 MG CAPS capsule Take 1 capsule (0.4 mg total) by mouth daily after supper. 05/13/21   Gwyneth SproutPlunkett, Whitney, MD  TOUJEO SOLOSTAR 300 UNIT/ML Solostar Pen Inject 15 Units into the skin at bedtime. 04/02/22   [provider]  Travoprost, BAK Free, (TRAVATAN) 0.004 % SOLN ophthalmic solution 1 drop at bedtime.    [provider]  YUPELRI 175 MCG/3ML nebulizer solution Inhale one vial in nebulizer once daily. Do not mix with other nebulized medications. 03/15/22   Lyndon CodeKhan, Fozia M, MD    Physical Exam: Vitals:   05/21/22 1327 05/21/22 1418 05/21/22 1651 05/21/22 1655  BP: (!) 76/42 92/62  (!) 118/56  Pulse: 67 68    Resp: 17 19  18    Temp: 98.3 F (36.8 C)   98.7 F (37.1 C)  TempSrc: Oral   Oral  SpO2: 96% 98%    Weight:   102.1 kg   Height:   5\' 10"  (1.778 m)     Constitutional: NAD, calm, comfortable Vitals:   05/21/22 1327 05/21/22 1418 05/21/22 1651 05/21/22 1655  BP: (!) 76/42 92/62  (!) 118/56  Pulse: 67 68    Resp: 17 19  18   Temp: 98.3 F (36.8 C)   98.7 F (37.1 C)  TempSrc: Oral   Oral  SpO2: 96% 98%    Weight:   102.1 kg   Height:   5\' 10"  (1.778 m)    Eyes: PERRL, lids and conjunctivae normal ENMT: Mucous membranes are moist. Posterior pharynx clear of any exudate or lesions.Normal dentition.  Neck: normal, supple, no masses, no thyromegaly Respiratory: Diminished breathing sound bilaterally, bilateral crackles on bilateral lung bases, diffused wheezing, increasing breathing effort no accessory muscle use.  Cardiovascular: Regular rate and rhythm, no murmurs / rubs / gallops. No extremity edema. 2+ pedal pulses. No carotid bruits.  Abdomen: no tenderness, no masses palpated. No hepatosplenomegaly. Bowel sounds positive.  Musculoskeletal: no clubbing / cyanosis. No joint deformity upper and lower extremities. Good ROM, no contractures. Normal muscle tone.  Skin: no rashes, lesions, ulcers. No induration.  Multiple skin laceration on left frontal scalp and left-sided face Neurologic: CN 2-12 grossly intact. Sensation intact, DTR normal. Strength 5/5 in all 4.  Psychiatric: Normal judgment and insight. Alert and oriented x 3. Normal mood.     Labs on Admission: I have personally reviewed following labs and imaging studies  CBC: Recent Labs  Lab 05/21/22 0043  WBC 9.7  NEUTROABS 7.2  HGB 11.9*  HCT 37.0*  MCV 100.0  PLT 218   Basic Metabolic Panel: Recent Labs  Lab 05/21/22 0043  NA 134*  K 4.5  CL 91*  CO2 33*  GLUCOSE 204*  BUN 27*  CREATININE 1.45*  CALCIUM 9.1   GFR: Estimated Creatinine Clearance: 45.4 mL/min (A) (by C-G formula based on SCr of 1.45 mg/dL (H)). Liver  Function Tests: No results for input(s): "AST", "ALT", "ALKPHOS", "BILITOT", "PROT", "ALBUMIN" in  the last 168 hours. No results for input(s): "LIPASE", "AMYLASE" in the last 168 hours. No results for input(s): "AMMONIA" in the last 168 hours. Coagulation Profile: No results for input(s): "INR", "PROTIME" in the last 168 hours. Cardiac Enzymes: No results for input(s): "CKTOTAL", "CKMB", "CKMBINDEX", "TROPONINI" in the last 168 hours. BNP (last 3 results) No results for input(s): "PROBNP" in the last 8760 hours. HbA1C: No results for input(s): "HGBA1C" in the last 72 hours. CBG: Recent Labs  Lab 05/21/22 0857 05/21/22 1159 05/21/22 1756  GLUCAP 124* 83 101*   Lipid Profile: No results for input(s): "CHOL", "HDL", "LDLCALC", "TRIG", "CHOLHDL", "LDLDIRECT" in the last 72 hours. Thyroid Function Tests: No results for input(s): "TSH", "T4TOTAL", "FREET4", "T3FREE", "THYROIDAB" in the last 72 hours. Anemia Panel: No results for input(s): "VITAMINB12", "FOLATE", "FERRITIN", "TIBC", "IRON", "RETICCTPCT" in the last 72 hours. Urine analysis:    Component Value Date/Time   COLORURINE YELLOW 05/13/2021 1519   APPEARANCEUR CLEAR 05/13/2021 1519   APPEARANCEUR Clear 08/01/2013 1614   LABSPEC 1.009 05/13/2021 1519   LABSPEC 1.006 08/01/2013 1614   PHURINE 5.5 05/13/2021 1519   GLUCOSEU >1,000 (A) 05/13/2021 1519   GLUCOSEU Negative 08/01/2013 1614   HGBUR NEGATIVE 05/13/2021 1519   BILIRUBINUR NEGATIVE 05/13/2021 1519   BILIRUBINUR Negative 08/01/2013 1614   KETONESUR NEGATIVE 05/13/2021 1519   PROTEINUR NEGATIVE 05/13/2021 1519   NITRITE NEGATIVE 05/13/2021 1519   LEUKOCYTESUR NEGATIVE 05/13/2021 1519   LEUKOCYTESUR Negative 08/01/2013 1614    Radiological Exams on Admission: CT Orbits W Contrast  Result Date: 05/21/2022 CLINICAL DATA:  Fall with ocular pain. EXAM: CT ORBITS WITH CONTRAST TECHNIQUE: Multidetector CT images was performed according to the standard protocol  following intravenous contrast administration. RADIATION DOSE REDUCTION: This exam was performed according to the departmental dose-optimization program which includes automated exposure control, adjustment of the mA and/or kV according to patient size and/or use of iterative reconstruction technique. CONTRAST:  75mL OMNIPAQUE IOHEXOL 300 MG/ML  SOLN COMPARISON:  None Available. FINDINGS: Orbits: The globes are intact. Optic nerves are normal. Normal extraocular muscles and lacrimal glands. The bony orbit, preseptal soft tissues and the intra- and extraconal orbital fat are normal. Visualized sinuses:  No fluid levels or advanced mucosal thickening. Soft tissues: Left supraorbital scalp laceration. Limited intracranial: Normal.  Intracranial ICA atherosclerosis. IMPRESSION: Left supraorbital scalp laceration without orbital injury. Electronically Signed   By: Deatra Robinson M.D.   On: 05/21/2022 02:29   CT Maxillofacial Wo Contrast  Result Date: 05/21/2022 CLINICAL DATA:  Facial trauma, blunt. Fell at home. Laceration over the left eye. EXAM: CT MAXILLOFACIAL WITHOUT CONTRAST TECHNIQUE: Multidetector CT imaging of the maxillofacial structures was performed. Multiplanar CT image reconstructions were also generated. RADIATION DOSE REDUCTION: This exam was performed according to the departmental dose-optimization program which includes automated exposure control, adjustment of the mA and/or kV according to patient size and/or use of iterative reconstruction technique. COMPARISON:  CT head 08/01/2013 FINDINGS: Osseous: Motion artifact limits evaluation. As visualized, the nasal bones, orbital bones, facial bones, and mandibles appear intact. No acute displaced fractures are identified. Orbits: Configuration of the orbits suggest exophthalmos bilaterally. Correlate with physical examination. The globes appear intact. Extraocular muscles appear somewhat thickened. Consider thyroid ophthalmopathy. No retrobulbar soft  tissue infiltration. Sinuses: Paranasal sinuses and mastoid air cells are clear. Soft tissues: Left periorbital soft tissue hematoma. No retro bulbar or intraconal involvement. Small amounts of soft tissue gas are demonstrated inferior to the right globe, possibly indicating penetrating injury or infection. Correlation  with physical examination is recommended. Limited intracranial: No acute abnormality. IMPRESSION: 1. No acute displaced orbital or facial fractures identified. 2. Left periorbital soft tissue hematoma. No retrobulbar involvement. 3. Tiny gas demonstrated inferior to the right orbit may represent normal variation but could indicate penetrating injury or infection. Correlate with physical examination. 4. Suggestion of exophthalmos with thickened extraocular muscles. Correlate with physical examination. Consider thyroid ophthalmopathy. Electronically Signed   By: Burman Nieves M.D.   On: 05/21/2022 00:01   CT Cervical Spine Wo Contrast  Result Date: 05/20/2022 CLINICAL DATA:  Neck trauma after a fall. EXAM: CT CERVICAL SPINE WITHOUT CONTRAST TECHNIQUE: Multidetector CT imaging of the cervical spine was performed without intravenous contrast. Multiplanar CT image reconstructions were also generated. RADIATION DOSE REDUCTION: This exam was performed according to the departmental dose-optimization program which includes automated exposure control, adjustment of the mA and/or kV according to patient size and/or use of iterative reconstruction technique. COMPARISON:  None Available. FINDINGS: Alignment: Straightening of usual cervical lordosis. This is likely positional or degenerative but could indicate muscle spasm. No anterior subluxations. Normal alignment of the facet joints. Skull base and vertebrae: The skull base appears intact. No vertebral compression deformities. No focal bone lesion or bone destruction. Bone cortex appears intact. Soft tissues and spinal canal: No prevertebral soft tissue  swelling. No abnormal paraspinal soft tissue mass or infiltration. Disc levels: Degenerative changes throughout with disc space narrowing and prominent endplate osteophyte formation. Degenerative changes throughout the posterior facet joints. Uncovertebral and facet joint spurring causes bone encroachment upon neural foramina bilaterally. Upper chest: Emphysematous changes and fibrosis in the lung apices. Other: Vascular calcification in the cervical carotid arteries with suggestion of at least moderate stenosis of bilateral distal common carotid and proximal internal carotid arteries. IMPRESSION: 1. Nonspecific straightening of usual cervical lordosis. No acute displaced fractures identified. 2. Prominent degenerative changes in the cervical spine. 3. Prominent vascular calcification in the cervical carotid arteries. Electronically Signed   By: Burman Nieves M.D.   On: 05/20/2022 23:56   CT Head Wo Contrast  Result Date: 05/20/2022 CLINICAL DATA:  Head trauma, moderate to severe. Fall. No loss of consciousness. EXAM: CT HEAD WITHOUT CONTRAST TECHNIQUE: Contiguous axial images were obtained from the base of the skull through the vertex without intravenous contrast. RADIATION DOSE REDUCTION: This exam was performed according to the departmental dose-optimization program which includes automated exposure control, adjustment of the mA and/or kV according to patient size and/or use of iterative reconstruction technique. COMPARISON:  CT head 08/01/2013.  MRI 08/01/2013 FINDINGS: Brain: Diffuse cerebral atrophy. Ventricular dilatation consistent with central atrophy. Low-attenuation changes in the deep white matter consistent with small vessel ischemia. No abnormal extra-axial fluid collections. No mass effect or midline shift. Gray-white matter junctions are distinct. Basal cisterns are not effaced. No acute intracranial hemorrhage. Vascular: Intracranial vascular calcifications. Skull: Normal. Negative for  fracture or focal lesion. Sinuses/Orbits: Paranasal sinuses and mastoid air cells are clear. Other: Subcutaneous scalp laceration over the left supraorbital region. Left periorbital soft tissue hematoma. IMPRESSION: 1. No acute intracranial abnormalities. 2. Diffuse atrophy and small vessel ischemic changes. Electronically Signed   By: Burman Nieves M.D.   On: 05/20/2022 23:53   DG Chest 2 View  Result Date: 05/20/2022 CLINICAL DATA:  Fall.  History of COPD. EXAM: CHEST - 2 VIEW COMPARISON:  12/09/2021 FINDINGS: The heart is enlarged. There are bilateral pleural effusions and fluid in the fissures. Peribronchial thickening which appears similar to prior exam and may be chronic.  No pneumothorax. No evidence of acute airspace disease. IMPRESSION: 1. Cardiomegaly with bilateral pleural effusions and fluid in the fissures, suspicious for CHF. 2. Peribronchial thickening appears similar to prior exam and may be chronic, either pulmonary edema or related COPD. Electronically Signed   By: Narda Rutherford M.D.   On: 05/20/2022 23:38   DG Forearm Left  Result Date: 05/20/2022 CLINICAL DATA:  Fall. Left forearm laceration. EXAM: LEFT FOREARM - 2 VIEW COMPARISON:  None Available. FINDINGS: The cortical margins of the radius and ulna are intact. There is no evidence of fracture or other focal bone lesions. Elbow alignment is maintained with mild degenerative spurring. Mild soft tissue edema. IMPRESSION: No fracture of the left forearm. Mild soft tissue edema. Electronically Signed   By: Narda Rutherford M.D.   On: 05/20/2022 23:36   DG Wrist Complete Left  Result Date: 05/20/2022 CLINICAL DATA:  Status post fall, left hand skin tear. EXAM: LEFT WRIST - COMPLETE 3+ VIEW COMPARISON:  None Available. FINDINGS: There is no evidence of fracture or dislocation. 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 Dorsal soft tissue edema overlies the metacarpals. Mild soft tissue  edema about the wrist. IMPRESSION: 1. Dorsal soft tissue edema without acute fracture or dislocation. 2. 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 Electronically Signed   By: Narda Rutherford M.D.   On: 05/20/2022 23:29    EKG: Independently reviewed.  Sinus, chronic LBBB  Assessment/Plan Principal Problem:   Acute diastolic heart failure (HCC) Active Problems:   Chronic respiratory failure with hypoxia (HCC)   Chronic kidney disease, stage 3 unspecified (HCC)   CHF (congestive heart failure) (HCC)  (please populate well all problems here in Problem List. (For example, if patient is on BP meds at home and you resume or decide to hold them, it is a problem that needs to be her. Same for CAD, COPD, HLD and so on)  Acute on chronic hypoxic respiratory failure -Likely from combined effect of acute on chronic HFpEF decompensation as well as acute COPD exacerbation -Continue oxygen support, and night O2 saturation 92-93%  Acute on chronic HFpEF decompensation -Continue IV Lasix twice daily, expect to switch to p.o. Lasix in next 24 hours. -Echocardiogram -Continue lisinopril 10 mg daily  Acute COPD exacerbation -Given the symptoms lasted for more than 7 days as well as change in color of stools and, will start short course of azithromycin -Short course of IV steroid bridging for p.o. steroid -Continue DuoNeb, add Dulera -Continue revefenacin daily nebulizing -Incentive spirometry and flutter valve  IIDM with hyperglycemia -On sliding scale -Continue Jardiance  CKD stage IIIa -Volume overload secondary to CHF decompensation, creatinine level stable -On IV Lasix  Chronic Graves' ophthalmopathy -Stable, cornea injury ruled out in ED and bilateral vision at baseline  Mechanical fall and multiple facial and frontal scalp laceration and bruise -With baseline chronic ambulation dysfunction on roller walker -Tdap given -No active bleeding -CT  soft tissue scan unremarkable for orbital injuries. -PT evaluation  Right orbital gas -Appears to be chronic, patient remember that "gas pocket behind right eye in the past"   DVT prophylaxis: Lovenox Code Status: Full code Family Communication: Wife at bedside Disposition Plan: Patient sick with COPD exacerbation and CHF decompensation requiring inpatient IV Lasix and COPD management, expect more than 2 midnight hospital stay. Consults called: None Admission status: Tele admit   Emeline General MD [facility] Pager [phone] 05/21/2022, 7:12 PM

## 2022-05-21 NOTE — ED Notes (Signed)
Pt on [facility] 3 LPM home regimen. Pt has hx of severe COPD per pulmonology note, uses BIPAP (trilogy) at night. Family will bring in his mask and trilogy for him to use while at facility. Pt BLBS LUL Rhonchi, RUL clear, both LLs diminished. Pt respiratory status stable currently on 3 Lpm w/no distress noted. Pt will desaturate during physical activity with slow recovery. RT will continue to monitor while at [facility] ED.

## 2022-05-21 NOTE — ED Notes (Addendum)
Wounds irrigated with a total of NS via shower bottle. LET gel applied to open wounds and wet dressing applied. Pt remains comfortable and denies need for additional pain medication at this time. No distress. Family at bedside. Lidocaine with epi at bedside.

## 2022-05-21 NOTE — Progress Notes (Signed)
Patient has home unit for use, patient states that his wife will assist him with set up and placement. RT will continue to monitor .

## 2022-05-21 NOTE — ED Notes (Signed)
Suture cart at bedside 

## 2022-05-22 ENCOUNTER — Inpatient Hospital Stay (HOSPITAL_COMMUNITY): Payer: Medicare Other

## 2022-05-22 DIAGNOSIS — I5031 Acute diastolic (congestive) heart failure: Secondary | ICD-10-CM | POA: Diagnosis not present

## 2022-05-22 LAB — ECHOCARDIOGRAM COMPLETE
AR max vel: 1.57 cm2
AV Area VTI: 1.68 cm2
AV Area mean vel: 1.51 cm2
AV Mean grad: 14 mmHg
AV Peak grad: 23.6 mmHg
Ao pk vel: 2.43 m/s
Area-P 1/2: 3.37 cm2
Calc EF: 40.4 %
Height: 70 in
S' Lateral: 4 cm
Single Plane A2C EF: 27.8 %
Single Plane A4C EF: 53.3 %
Weight: 3576.74 oz

## 2022-05-22 LAB — BASIC METABOLIC PANEL
Anion gap: 11 (ref 5–15)
BUN: 22 mg/dL (ref 8–23)
CO2: 35 mmol/L — ABNORMAL HIGH (ref 22–32)
Calcium: 8.6 mg/dL — ABNORMAL LOW (ref 8.9–10.3)
Chloride: 90 mmol/L — ABNORMAL LOW (ref 98–111)
Creatinine, Ser: 1.32 mg/dL — ABNORMAL HIGH (ref 0.61–1.24)
GFR, Estimated: 53 mL/min — ABNORMAL LOW (ref >60)
Glucose, Bld: 246 mg/dL — ABNORMAL HIGH (ref 70–99)
Potassium: 5.6 mmol/L — ABNORMAL HIGH (ref 3.5–5.1)
Sodium: 136 mmol/L (ref 135–145)

## 2022-05-22 LAB — CBC
HCT: 37.9 % — ABNORMAL LOW (ref 39.0–52.0)
Hemoglobin: 12.2 g/dL — ABNORMAL LOW (ref 13.0–17.0)
MCH: 32.4 pg (ref 26.0–34.0)
MCHC: 32.2 g/dL (ref 30.0–36.0)
MCV: 100.8 fL — ABNORMAL HIGH (ref 80.0–100.0)
Platelets: 207 10*3/uL (ref 150–400)
RBC: 3.76 MIL/uL — ABNORMAL LOW (ref 4.22–5.81)
RDW: 15.1 % (ref 11.5–15.5)
WBC: 9.8 10*3/uL (ref 4.0–10.5)
nRBC: 0 % (ref 0.0–0.2)

## 2022-05-22 LAB — GLUCOSE, CAPILLARY
Glucose-Capillary: 243 mg/dL — ABNORMAL HIGH (ref 70–99)
Glucose-Capillary: 382 mg/dL — ABNORMAL HIGH (ref 70–99)
Glucose-Capillary: 252 mg/dL — ABNORMAL HIGH (ref 70–99)
Glucose-Capillary: 184 mg/dL — ABNORMAL HIGH (ref 70–99)

## 2022-05-22 MED ORDER — INSULIN GLARGINE-YFGN 100 UNIT/ML ~~LOC~~ SOLN
16.0000 [IU] | Freq: Every day | SUBCUTANEOUS | Status: DC
  Administered 2022-05-22 – 2022-05-24 (×3): 16 [IU] via SUBCUTANEOUS
  Filled 2022-05-22 (×3): qty 0.16

## 2022-05-22 MED ORDER — INSULIN ASPART 100 UNIT/ML IJ SOLN
3.0000 [IU] | Freq: Three times a day (TID) | INTRAMUSCULAR | Status: DC
  Administered 2022-05-22 – 2022-05-24 (×6): 3 [IU] via SUBCUTANEOUS

## 2022-05-22 NOTE — Hospital Course (Signed)
[name] is an 86 y.o. M with hx Graves' ophthalmopathy, COPD, CHF came with mechanical fall on forehead.   In the ER, CT maxillary showed no acute fracture,   Found to have COPD exacerbation and CHF flare.

## 2022-05-22 NOTE — Evaluation (Signed)
Physical Therapy Evaluation Patient Details Name: [name] MRN: [id number] DOB: [birth year] Today's Date: 05/22/2022  History of Present Illness  Patient is a 86 y/o male who presents on 12/14 after falling off his stool, SOB and cough. Found to have pulmonary edema, bilateral conjunctivial hemorrhage, facial laceration, acute on chronic HF, acute COPD exacerbation and acute on chronic hypoxic respiratory failure. PMH includes CHF, COPD, DM, HTN, PVD, sleep apnea.  Clinical Impression  Patient presents with generalized weakness, decreased endurance, impaired activity tolerance, dyspnea on exertion, and impaired mobility s/p above. Pt lives at home with his wife and uses rollator for ambulation PTA. Pt needs assist for bathing but able to dress self. Does not leave the house much and is a limited household ambulator. Today, pt requires Min A for standing and min guard assist for gait training with use of RW for support. Sp02 dropped to 85% on 3L/min 02 [place] and took a few minutes of rest and pursed lip breathing to recover. Encouraged OOB, sitting in chair for all meals and walking to improve strength/mobility. Will consult mobility tech. Will follow acutely to maximize independence and mobility prior to return home.      Recommendations for follow up therapy are one component of a multi-disciplinary discharge planning process, led by the attending physician.  Recommendations may be updated based on patient status, additional functional criteria and insurance authorization.  Follow Up Recommendations No PT follow up      Assistance Recommended at Discharge Intermittent Supervision/Assistance  Patient can return home with the following  A little help with walking and/or transfers;A little help with bathing/dressing/bathroom;Assistance with cooking/housework;Assist for transportation    Equipment Recommendations None recommended by PT  Recommendations for Other Services       Functional  Status Assessment Patient has had a recent decline in their functional status and demonstrates the ability to make significant improvements in function in a reasonable and predictable amount of time.     Precautions / Restrictions Precautions Precautions: Fall;Other (comment) Precaution Comments: watch 02 Restrictions Weight Bearing Restrictions: No      Mobility  Bed Mobility Overal bed mobility: Needs Assistance Bed Mobility: Supine to Sit     Supine to sit: Supervision, HOB elevated     General bed mobility comments: Use of rail, increased time.    Transfers Overall transfer level: Needs assistance Equipment used: Rolling walker (2 wheels) Transfers: Sit to/from Stand Sit to Stand: Min assist           General transfer comment: Min A to power to standing from EOB x2 with cues for hand placement/technique, transferred to chair post ambulation.    Ambulation/Gait Ambulation/Gait assistance: Min guard Gait Distance (Feet): 40 Feet (+ 15') Assistive device: Rolling walker (2 wheels) Gait Pattern/deviations: Step-through pattern, Decreased stride length, Trunk flexed Gait velocity: decreased Gait velocity interpretation: <1.31 ft/sec, indicative of household ambulator   General Gait Details: Slow, mildly unsteady gait with use of RW for support. 2-3/4 DOE. Sp02 dropped to 85% on 3L/min 02 [place]. Cues for pursed lip breathing. 1 seated rest break. Took a few mins to recover  Information systems manager Rankin (Stroke Patients Only)       Balance Overall balance assessment: Needs assistance Sitting-balance support: Feet supported, No upper extremity supported Sitting balance-Leahy Scale: Good     Standing balance support: During functional activity, Bilateral upper extremity supported Standing balance-Leahy Scale: Poor Standing  balance comment: Relies on RW for support. leaning on forearms on RW when fatigued or SOB                              Pertinent Vitals/Pain Pain Assessment Pain Assessment: No/denies pain    Home Living Family/patient expects to be discharged to:: Private residence Living Arrangements: Spouse/significant other Available Help at Discharge: Family;Available 24 hours/day Type of Home: House Home Access: Ramped entrance       Home Layout: One level Home Equipment: Rollator (4 wheels);Cane - single point;Shower seat;Transport chair;Electric scooter      Prior Function Prior Level of Function : Needs assist       Physical Assist : ADLs (physical)   ADLs (physical): Bathing;IADLs Mobility Comments: Uses rollator for household ambulation and electric scooter for community ambulation, wears 02 at home. has not been out much. Uses transport chair for appts. ADLs Comments: independent for dressing, assist needed for bathing. has a stool in the shower     Hand Dominance        Extremity/Trunk Assessment   Upper Extremity Assessment Upper Extremity Assessment: Defer to OT evaluation    Lower Extremity Assessment Lower Extremity Assessment: Generalized weakness    Cervical / Trunk Assessment Cervical / Trunk Assessment: Kyphotic  Communication   Communication: No difficulties  Cognition Arousal/Alertness: Awake/alert Behavior During Therapy: WFL for tasks assessed/performed Overall Cognitive Status: Within Functional Limits for tasks assessed                                          General Comments General comments (skin integrity, edema, etc.): Wife present during session. Sp02 dropped to 85% on 3L/min 02 [place] with short distance ambulation.    Exercises     Assessment/Plan    PT Assessment Patient needs continued PT services  PT Problem List Decreased strength;Decreased mobility;Cardiopulmonary status limiting activity;Decreased activity tolerance;Decreased balance       PT Treatment Interventions Therapeutic exercise;Patient/family  education;Therapeutic activities;Balance training;Gait training    PT Goals (Current goals can be found in the Care Plan section)  Acute Rehab PT Goals Patient Stated Goal: to go home PT Goal Formulation: With patient Time For Goal Achievement: 06/05/22 Potential to Achieve Goals: Good    Frequency Min 3X/week     Co-evaluation               AM-PAC PT "6 Clicks" Mobility  Outcome Measure Help needed turning from your back to your side while in a flat bed without using bedrails?: None Help needed moving from lying on your back to sitting on the side of a flat bed without using bedrails?: A Little Help needed moving to and from a bed to a chair (including a wheelchair)?: A Little Help needed standing up from a chair using your arms (e.g., wheelchair or bedside chair)?: A Little Help needed to walk in hospital room?: A Little Help needed climbing 3-5 steps with a railing? : A Lot 6 Click Score: 18    End of Session Equipment Utilized During Treatment: Oxygen;Gait belt Activity Tolerance: Treatment limited secondary to medical complications (Comment) (drop in Sp02) Patient left: in chair;with call bell/phone within reach;with family/visitor present Nurse Communication: Mobility status;Other (comment) (02) PT Visit Diagnosis: Muscle weakness (generalized) (M62.81);Difficulty in walking, not elsewhere classified (R26.2);Other (comment) (dyspnea)    Time: 9381-8299 PT  Time Calculation (min) (ACUTE ONLY): 32 min   Charges:   PT Evaluation $PT Eval Moderate Complexity: 1 Mod PT Treatments $Gait Training: 8-22 mins        Vale Haven, PT, DPT Acute Rehabilitation Services Secure chat preferred Office [phone]     [name] 05/22/2022, 12:22 PM

## 2022-05-22 NOTE — Assessment & Plan Note (Signed)
Requires 3L at home.  Here is desaturating to mid 80s on 4L with ambulation.  Still wheezing.

## 2022-05-22 NOTE — Progress Notes (Signed)
  Progress Note   Patient: [name] QAS:[id number] DOB: 07-08-1935 DOA: 05/20/2022     1 DOS: the patient was seen and examined on 05/22/2022 at 11:10AM      Brief hospital course: [name] is an 86 y.o. M with hx Graves' ophthalmopathy, COPD, CHF came with mechanical fall on forehead.   In the ER, CT maxillary showed no acute fracture,   Found to have COPD exacerbation and CHF flare.      Assessment and Plan: Acute on chronic diastolic CHF Net -2.8L yesterday, weight down 3 kg Cr stable, BP soft Still hypoxic and with some LE edema - Continue Furosemide IV twice a day  - K supplement - Strict I/Os, daily weights, telemetry  - Daily monitoring renal function - Follow echo    COPD exacerbation Still wheezing and hypoxic - Continue steroids, bronchodilators, antibiotics - Continue Singulair, LAMA, Daliresp, Pepcid  Chronic kidney disease, stage 3a Cr stable relative to baseline  Essential hypertension Blood pressure soft - Hold Jardiance, lisinopril -Continue carvedilol, furosemide  Chronic respiratory failure with hypoxia (HCC) Acute respiratory failure ruled out. Requires 3L at home.  Here is desaturating to mid 80s on 4L with ambulation.  Still wheezing.  Type 2 diabetes Glucoses controlled - Continue glargine - Continue mealtime insulin and aspart  Graves' ophthalmopathy Fluorescein staining on admission showed no corneal abrasions - Continue home eye drops  Hypothyroidism - Continue levothyroxine, liothyronine  Scalp laceration Remove sutures on 12/23  Right orbital gas Old  Hyperkalemia - Continue IV Lasix          Subjective: Feeling better, still somewhat congested, wheezing.  Overall weak.     Physical Exam: BP 132/63 (BP Location: Right Arm)   Pulse 77   Temp 97.6 F (36.4 C) (Oral)   Resp 20   Ht 5\' 10"  (1.778 m)   Wt 101.4 kg   SpO2 93%   BMI 32.08 kg/m   Elderly adult male, lying in bed, abrasion to the left  forehead, Myopathy with Left Eye Subconjunctival Hemorrhage RRR, No Murmurs, 1+ Lower Extremity Peripheral Edema Respiratory Rate Normal, Lungs with Wheezing Bilaterally, Crackles at Bases Abdomen Soft Nontender to Palpation or Guarding, No Ascites or Distention Attention normal, affect appropriate, judgment and insight appear normal    Data Reviewed: Glucose elevated Creatinine improving Potassium up to 5.6 CBC shows macrocytosis Chest x-ray shows edema and effusions  Family Communication: Wife at the bedside    Disposition: Status is: Inpatient Remains hypoxic, will need furhter diuresis and steroids        Author: , MD 05/22/2022 3:46 PM  For on call review [email or web address].

## 2022-05-22 NOTE — Progress Notes (Signed)
  Echocardiogram 2D Echocardiogram has been performed.  [name] 05/22/2022, 2:15 PM

## 2022-05-22 NOTE — Assessment & Plan Note (Signed)
Cr stable relative to baseline 

## 2022-05-22 NOTE — Progress Notes (Incomplete)
{  Select Note:[id number]} 

## 2022-05-23 DIAGNOSIS — E0500 Thyrotoxicosis with diffuse goiter without thyrotoxic crisis or storm: Secondary | ICD-10-CM | POA: Insufficient documentation

## 2022-05-23 DIAGNOSIS — J441 Chronic obstructive pulmonary disease with (acute) exacerbation: Secondary | ICD-10-CM | POA: Insufficient documentation

## 2022-05-23 DIAGNOSIS — I5031 Acute diastolic (congestive) heart failure: Secondary | ICD-10-CM | POA: Diagnosis not present

## 2022-05-23 DIAGNOSIS — I10 Essential (primary) hypertension: Secondary | ICD-10-CM | POA: Insufficient documentation

## 2022-05-23 DIAGNOSIS — S0101XA Laceration without foreign body of scalp, initial encounter: Secondary | ICD-10-CM | POA: Insufficient documentation

## 2022-05-23 DIAGNOSIS — E875 Hyperkalemia: Secondary | ICD-10-CM | POA: Insufficient documentation

## 2022-05-23 DIAGNOSIS — E1165 Type 2 diabetes mellitus with hyperglycemia: Secondary | ICD-10-CM | POA: Insufficient documentation

## 2022-05-23 LAB — BASIC METABOLIC PANEL
Anion gap: 9 (ref 5–15)
BUN: 33 mg/dL — ABNORMAL HIGH (ref 8–23)
CO2: 34 mmol/L — ABNORMAL HIGH (ref 22–32)
Calcium: 8.3 mg/dL — ABNORMAL LOW (ref 8.9–10.3)
Chloride: 92 mmol/L — ABNORMAL LOW (ref 98–111)
Creatinine, Ser: 1.35 mg/dL — ABNORMAL HIGH (ref 0.61–1.24)
GFR, Estimated: 52 mL/min — ABNORMAL LOW (ref >60)
Glucose, Bld: 161 mg/dL — ABNORMAL HIGH (ref 70–99)
Potassium: 4.2 mmol/L (ref 3.5–5.1)
Sodium: 135 mmol/L (ref 135–145)

## 2022-05-23 LAB — GLUCOSE, CAPILLARY
Glucose-Capillary: 174 mg/dL — ABNORMAL HIGH (ref 70–99)
Glucose-Capillary: 252 mg/dL — ABNORMAL HIGH (ref 70–99)
Glucose-Capillary: 187 mg/dL — ABNORMAL HIGH (ref 70–99)
Glucose-Capillary: 230 mg/dL — ABNORMAL HIGH (ref 70–99)

## 2022-05-23 LAB — CBC
HCT: 33.1 % — ABNORMAL LOW (ref 39.0–52.0)
Hemoglobin: 11.1 g/dL — ABNORMAL LOW (ref 13.0–17.0)
MCH: 32.7 pg (ref 26.0–34.0)
MCHC: 33.5 g/dL (ref 30.0–36.0)
MCV: 97.6 fL (ref 80.0–100.0)
Platelets: 224 10*3/uL (ref 150–400)
RBC: 3.39 MIL/uL — ABNORMAL LOW (ref 4.22–5.81)
RDW: 15.1 % (ref 11.5–15.5)
WBC: 13.9 10*3/uL — ABNORMAL HIGH (ref 4.0–10.5)
nRBC: 0 % (ref 0.0–0.2)

## 2022-05-23 NOTE — Plan of Care (Signed)
  Problem: Education: Goal: Ability to describe self-care measures that may prevent or decrease complications (Diabetes Survival Skills Education) will improve Outcome: Progressing Goal: Individualized Educational Video(s) Outcome: Progressing   Problem: Coping: Goal: Ability to adjust to condition or change in health will improve Outcome: Progressing   Problem: Fluid Volume: Goal: Ability to maintain a balanced intake and output will improve Outcome: Progressing   Problem: Health Behavior/Discharge Planning: Goal: Ability to identify and utilize available resources and services will improve Outcome: Progressing Goal: Ability to manage health-related needs will improve Outcome: Progressing   Problem: Metabolic: Goal: Ability to maintain appropriate glucose levels will improve Outcome: Progressing   Problem: Nutritional: Goal: Maintenance of adequate nutrition will improve Outcome: Progressing Goal: Progress toward achieving an optimal weight will improve Outcome: Progressing   Problem: Skin Integrity: Goal: Risk for impaired skin integrity will decrease Outcome: Progressing   Problem: Tissue Perfusion: Goal: Adequacy of tissue perfusion will improve Outcome: Progressing   Problem: Education: Goal: Knowledge of General Education information will improve Description: Including pain rating scale, medication(s)/side effects and non-pharmacologic comfort measures Outcome: Progressing   Problem: Health Behavior/Discharge Planning: Goal: Ability to manage health-related needs will improve Outcome: Progressing   Problem: Clinical Measurements: Goal: Ability to maintain clinical measurements within normal limits will improve Outcome: Progressing Goal: Will remain free from infection Outcome: Progressing Goal: Diagnostic test results will improve Outcome: Progressing Goal: Respiratory complications will improve Outcome: Progressing Goal: Cardiovascular complication will  be avoided Outcome: Progressing   Problem: Activity: Goal: Risk for activity intolerance will decrease Outcome: Progressing   Problem: Nutrition: Goal: Adequate nutrition will be maintained Outcome: Progressing   Problem: Coping: Goal: Level of anxiety will decrease Outcome: Progressing   Problem: Elimination: Goal: Will not experience complications related to bowel motility Outcome: Progressing Goal: Will not experience complications related to urinary retention Outcome: Progressing   Problem: Pain Managment: Goal: General experience of comfort will improve Outcome: Progressing   Problem: Safety: Goal: Ability to remain free from injury will improve Outcome: Progressing   Problem: Skin Integrity: Goal: Risk for impaired skin integrity will decrease Outcome: Progressing   Problem: Education: Goal: Ability to demonstrate management of disease process will improve Outcome: Progressing Goal: Ability to verbalize understanding of medication therapies will improve Outcome: Progressing Goal: Individualized Educational Video(s) Outcome: Progressing   Problem: Activity: Goal: Capacity to carry out activities will improve Outcome: Progressing   Problem: Cardiac: Goal: Ability to achieve and maintain adequate cardiopulmonary perfusion will improve Outcome: Progressing   Problem: Education: Goal: Knowledge of disease or condition will improve Outcome: Progressing Goal: Knowledge of the prescribed therapeutic regimen will improve Outcome: Progressing Goal: Individualized Educational Video(s) Outcome: Progressing   Problem: Activity: Goal: Ability to tolerate increased activity will improve Outcome: Progressing Goal: Will verbalize the importance of balancing activity with adequate rest periods Outcome: Progressing   Problem: Respiratory: Goal: Ability to maintain a clear airway will improve Outcome: Progressing Goal: Levels of oxygenation will improve Outcome:  Progressing Goal: Ability to maintain adequate ventilation will improve Outcome: Progressing   

## 2022-05-23 NOTE — Progress Notes (Signed)
  Progress Note   Patient: [name] DUK:[id number] DOB: 02-23-1936 DOA: 05/20/2022     2 DOS: the patient was seen and examined on 05/23/2022 at 10:39AM      Brief hospital course: Mr. Sitter is an 86 y.o. M with hx Graves' ophthalmopathy, COPD, CHF came with mechanical fall on forehead.   In the ER, CT maxillary showed no acute fracture,   Found to have COPD exacerbation and CHF flare.      Assessment and Plan: Acute on chronic diastolic CHF Net -2.4 L yesterday, but ins does not accurately recorded, weight no change, still on oxygen, still swollen  Echocardiogram shows EF 50%, grade 1 diastolic dysfunction, mild IVSD decreased IVC respiratory variability - Continue Furosemide IV twice a day  - K supplement - Strict I/Os, daily weights, telemetry  - Daily monitoring renal function    COPD exacerbation Still coughing up increased sputum and wheezing - Continue steroids, bronchodilators, antibiotics - Continue Singulair, LAMA, Daliresp, Pepcid  Chronic kidney disease, stage 3a Cr stable relative to baseline  Essential hypertension Blood pressure controlled - Hold Jardiance, lisinopril -Continue carvedilol, furosemide  Chronic respiratory failure with hypoxia (HCC) Acute respiratory failure ruled out. Requires 3L at home.  Here is still desaturating to mid 80s on 4L with ambulation.  Still wheezing.  Type 2 diabetes Glucoses controlled - Continue glargine - Continue mealtime insulin and aspart  Graves' ophthalmopathy Fluorescein staining on admission showed no corneal abrasions - Continue home eye drops  Hypothyroidism - Continue levothyroxine, liothyronine  Scalp laceration Remove sutures on 12/23  Right orbital gas Old  Hyperkalemia Resolved          Subjective: Still very weak, congested, short of breath, no confusion or fever     Physical Exam: BP 125/64 (BP Location: Left Arm)   Pulse 73   Temp 97.9 F (36.6 C) (Oral)    Resp 20   Ht 5\' 10"  (1.778 m)   Wt 101.4 kg   SpO2 98%   BMI 32.08 kg/m   Elderly adult male, lying in bed, no change to the abrasion over his left eye subconjunctival hemorrhage RRR, no murmurs appreciated, trace lower extremity peripheral edema Respiratory rate increased with exertion, lungs with diminished overall with some scant wheezing, no crackles Abdomen soft without tenderness palpation or guarding, no ascites or distention Generalized weakness, symmetric strength, face symmetric, attention normal, affect appropriate     Data Reviewed: Glucose normal White blood cells count due to steroids Basic metabolic panel with creatinine 1.3 no change from yesterday  Family Communication: Wife at the bedside    Disposition: Status is: Inpatient Remains hypoxic with exertion, will continue diuresis and steroids and bronchodilators        Author: , MD 05/23/2022 1:32 PM  For on call review [email or web address]/[email or web address].

## 2022-05-23 NOTE — Progress Notes (Addendum)
Physical Therapy Treatment Patient Details Name: [name] MRN: [id number] DOB: 05/04/1936 Today's Date: 05/23/2022   History of Present Illness Patient is a 86 y/o male who presents on 12/14 after falling off his stool, SOB and cough. Found to have pulmonary edema, bilateral conjunctivial hemorrhage, facial laceration, acute on chronic HF, acute COPD exacerbation and acute on chronic hypoxic respiratory failure. PMH includes CHF, COPD, DM, HTN, PVD, sleep apnea.    PT Comments    Pt progressing steadily towards his physical therapy goals, exhibiting improved activity tolerance and ambulation distance. Pt ambulating a total of 120 ft with a Rollator at a supervision level. Pt able to maintain oxygen saturations >/= 87% on 3L O2 during first 60 ft of walk, during second 60 ft of walk, pt with desat to 84%, with approximately 2-3 minute seated rest break for recovery to 94%. Reviewed IS use; pt pulling ~1250 ml. Discussed with pt and pt feels he would gain incentive to participate in regular exercise and activity with follow up HHPT. Updated d/c recommendation.    Recommendations for follow up therapy are one component of a multi-disciplinary discharge planning process, led by the attending physician.  Recommendations may be updated based on patient status, additional functional criteria and insurance authorization.  Follow Up Recommendations  Home health PT     Assistance Recommended at Discharge Intermittent Supervision/Assistance  Patient can return home with the following A little help with walking and/or transfers;A little help with bathing/dressing/bathroom;Assistance with cooking/housework;Assist for transportation   Equipment Recommendations  None recommended by PT    Recommendations for Other Services       Precautions / Restrictions Precautions Precautions: Fall;Other (comment) Precaution Comments: watch 02 Restrictions Weight Bearing Restrictions: No     Mobility   Bed Mobility Overal bed mobility: Needs Assistance Bed Mobility: Supine to Sit, Sit to Supine     Supine to sit: Min assist Sit to supine: Supervision   General bed mobility comments: Pt seeking HHA to power up due to stitches in L hand    Transfers Overall transfer level: Needs assistance Equipment used: Rollator (4 wheels) Transfers: Sit to/from Stand Sit to Stand: Supervision                Ambulation/Gait Ambulation/Gait assistance: Supervision Gait Distance (Feet): 120 Feet Assistive device: Rollator (4 wheels) Gait Pattern/deviations: Step-through pattern, Decreased stride length, Trunk flexed Gait velocity: decreased     General Gait Details: Slow, mildly unsteady gait with supervision overall for safety. Cues for pursed lip breathing   Stairs             Wheelchair Mobility    Modified Rankin (Stroke Patients Only)       Balance Overall balance assessment: Needs assistance Sitting-balance support: Feet supported Sitting balance-Leahy Scale: Good     Standing balance support: During functional activity, Bilateral upper extremity supported Standing balance-Leahy Scale: Poor                              Cognition Arousal/Alertness: Awake/alert Behavior During Therapy: WFL for tasks assessed/performed Overall Cognitive Status: Within Functional Limits for tasks assessed                                          Exercises      General Comments  Pertinent Vitals/Pain Pain Assessment Pain Assessment: No/denies pain    Home Living                          Prior Function            PT Goals (current goals can now be found in the care plan section) Acute Rehab PT Goals Patient Stated Goal: to go home Potential to Achieve Goals: Good Progress towards PT goals: Progressing toward goals    Frequency    Min 3X/week      PT Plan Current plan remains appropriate    Co-evaluation               AM-PAC PT "6 Clicks" Mobility   Outcome Measure  Help needed turning from your back to your side while in a flat bed without using bedrails?: None Help needed moving from lying on your back to sitting on the side of a flat bed without using bedrails?: A Little Help needed moving to and from a bed to a chair (including a wheelchair)?: A Little Help needed standing up from a chair using your arms (e.g., wheelchair or bedside chair)?: A Little Help needed to walk in hospital room?: A Little Help needed climbing 3-5 steps with a railing? : A Lot 6 Click Score: 18    End of Session Equipment Utilized During Treatment: Oxygen Activity Tolerance: Patient tolerated treatment well Patient left: in bed;with call bell/phone within reach;with family/visitor present Nurse Communication: Mobility status PT Visit Diagnosis: Muscle weakness (generalized) (M62.81);Difficulty in walking, not elsewhere classified (R26.2);Other (comment)     Time: 9373-4287 PT Time Calculation (min) (ACUTE ONLY): 22 min  Charges:  $Therapeutic Activity: 8-22 mins                     Lillia Pauls, PT, DPT Acute Rehabilitation Services Office [phone]    Norval Morton 05/23/2022, 4:17 PM

## 2022-05-24 ENCOUNTER — Other Ambulatory Visit (HOSPITAL_COMMUNITY): Payer: Self-pay

## 2022-05-24 DIAGNOSIS — J9611 Chronic respiratory failure with hypoxia: Secondary | ICD-10-CM

## 2022-05-24 DIAGNOSIS — I10 Essential (primary) hypertension: Secondary | ICD-10-CM

## 2022-05-24 DIAGNOSIS — I5033 Acute on chronic diastolic (congestive) heart failure: Secondary | ICD-10-CM | POA: Diagnosis not present

## 2022-05-24 DIAGNOSIS — J441 Chronic obstructive pulmonary disease with (acute) exacerbation: Secondary | ICD-10-CM | POA: Diagnosis not present

## 2022-05-24 LAB — GLUCOSE, CAPILLARY
Glucose-Capillary: 150 mg/dL — ABNORMAL HIGH (ref 70–99)
Glucose-Capillary: 229 mg/dL — ABNORMAL HIGH (ref 70–99)

## 2022-05-24 LAB — BASIC METABOLIC PANEL
Anion gap: 11 (ref 5–15)
BUN: 39 mg/dL — ABNORMAL HIGH (ref 8–23)
CO2: 32 mmol/L (ref 22–32)
Calcium: 8.6 mg/dL — ABNORMAL LOW (ref 8.9–10.3)
Chloride: 90 mmol/L — ABNORMAL LOW (ref 98–111)
Creatinine, Ser: 1.41 mg/dL — ABNORMAL HIGH (ref 0.61–1.24)
GFR, Estimated: 49 mL/min — ABNORMAL LOW (ref >60)
Glucose, Bld: 182 mg/dL — ABNORMAL HIGH (ref 70–99)
Potassium: 3.8 mmol/L (ref 3.5–5.1)
Sodium: 133 mmol/L — ABNORMAL LOW (ref 135–145)

## 2022-05-24 MED ORDER — AZITHROMYCIN 250 MG PO TABS
250.0000 mg | ORAL_TABLET | Freq: Every day | ORAL | 0 refills | Status: DC
  Filled 2022-05-24: qty 3, 3d supply, fill #0

## 2022-05-24 MED ORDER — PREDNISONE 20 MG PO TABS
40.0000 mg | ORAL_TABLET | Freq: Every day | ORAL | 0 refills | Status: DC
  Filled 2022-05-24: qty 6, 3d supply, fill #0

## 2022-05-24 NOTE — Inpatient Diabetes Management (Signed)
Inpatient Diabetes Program Recommendations  AACE/ADA: New Consensus Statement on Inpatient Glycemic Control (2015)  Target Ranges:  Prepandial:   less than 140 mg/dL      Peak postprandial:   less than 180 mg/dL (1-2 hours)      Critically ill patients:  140 - 180 mg/dL   Lab Results  Component Value Date   GLUCAP 150 (H) 05/24/2022   HGBA1C 6.4 03/22/2006    Review of Glycemic Control  Latest Reference Range & Units 05/23/22 06:07 05/23/22 12:00 05/23/22 16:35 05/23/22 21:08 05/24/22 06:08  Glucose-Capillary 70 - 99 mg/dL 201 (H) 007 (H) 121 (H) 230 (H) 150 (H)   Diabetes history: DM 2 Outpatient Diabetes medications: Glipizide 5 mg Daily, Toujeo 15 units qhs, Jardiance 10 mg Daily Current orders for Inpatient glycemic control:  Semglee 16 units Daily Novolog 0-15 units tid + hs Novolog 3 units tid meal coverage  PO prednisone 40 mg Daily  Inpatient Diabetes Program Recommendations:    Post prandial glucose trends increase after PO intake and steroid dose  -  Consider increasing Novolog meal coverage to 5 units tid if eating >50% of meals  Thanks, Christena Deem RN, MSN, BC-ADM Inpatient Diabetes Coordinator Team Pager [phone] (8a-5p)

## 2022-05-24 NOTE — Progress Notes (Signed)
Heart Failure Navigator Progress Note  Assessed for Heart & Vascular TOC clinic readiness.  Patient home with palliative, No TOC per Dr. Maryfrances Bunnell.   Navigator will sign off at this time.Rhae Hammock, BSN, RN Heart Failure Teacher, adult education Only

## 2022-05-24 NOTE — TOC Initial Note (Signed)
Transition of Care [facility]) - Initial/Assessment Note    Patient Details  Name: [name] MRN: [id number] Date of Birth: [birth year]  Transition of Care [facility] CM/SW Contact:    Leone Haven, RN Phone Number: 05/24/2022, 10:34 AM  Clinical Narrative:                 Patient lives with wife, he has all the DME he  needs.  Has PCP.  She will transport him home today. He has his home oxygen in the room with him.  NCM offered choice, he chose [facility],  NCM made referral to Amy she is able to take referral.  Soc will begin 24 to 48hrs post dc.   Expected Discharge Plan: Home w Home Health Services Barriers to Discharge: No Barriers Identified   Patient Goals and CMS Choice Patient states their goals for this hospitalization and ongoing recovery are:: return home CMS Medicare.gov Compare Post Acute Care list provided to:: Patient Choice offered to / list presented to : Patient  Expected Discharge Plan and Services Expected Discharge Plan: Home w Home Health Services In-house Referral: NA Discharge Planning Services: CM Consult Post Acute Care Choice: Home Health Living arrangements for the past 2 months: Single Family Home Expected Discharge Date: 05/24/22               DME Arranged: N/A DME Agency: NA       HH Arranged: PT, OT HH Agency: [facility] Date HH Agency Contacted: 05/24/22 Time HH Agency Contacted: 1033 Representative spoke with at [facility] Agency: Amy  Prior Living Arrangements/Services Living arrangements for the past 2 months: Single Family Home Lives with:: Spouse Patient language and need for interpreter reviewed:: Yes        Need for Family Participation in Patient Care: Yes (Comment) Care giver support system in place?: Yes (comment) Current home services: DME (walker, w/chair, elec w/chair, scooter, oxygen with [employer] [facility],  3 liters) Criminal Activity/Legal Involvement Pertinent to Current Situation/Hospitalization: No - Comment  as needed  Activities of Daily Living Home Assistive Devices/Equipment: Walker (specify type) ADL Screening (condition at time of admission) Patient's cognitive ability adequate to safely complete daily activities?: Yes Is the patient deaf or have difficulty hearing?: No Does the patient have difficulty seeing, even when wearing glasses/contacts?: No Does the patient have difficulty concentrating, remembering, or making decisions?: No Patient able to express need for assistance with ADLs?: Yes Does the patient have difficulty dressing or bathing?: No Independently performs ADLs?: Yes (appropriate for developmental age) Does the patient have difficulty walking or climbing stairs?: Yes Weakness of Legs: Both Weakness of Arms/Hands: Both  Permission Sought/Granted                  Emotional Assessment Appearance:: Appears stated age Attitude/Demeanor/Rapport: Engaged Affect (typically observed): Appropriate Orientation: : Oriented to Self, Oriented to Place, Oriented to  Time, Oriented to Situation Alcohol / Substance Use: Not Applicable Psych Involvement: No (comment)  Admission diagnosis:  Acute diastolic heart failure (HCC) [I50.31] AKI (acute kidney injury) (HCC) [N17.9] Corneal abrasion, unspecified laterality, initial encounter [S05.00XA] Subconjunctival hemorrhage of both eyes [H11.33] Skin tear of left forearm without complication, initial encounter [S51.812A] Laceration of multiple sites of left upper extremity, initial encounter [S41.112A] Acute congestive heart failure, unspecified heart failure type (HCC) [I50.9] CHF (congestive heart failure) (HCC) [I50.9] Patient Active Problem List   Diagnosis Date Noted   COPD with acute exacerbation (HCC) 05/23/2022   Essential hypertension 05/23/2022  Type 2 diabetes mellitus with hyperglycemia (HCC) 05/23/2022   Scalp laceration 05/23/2022   Graves' ophthalmopathy 05/23/2022   Hyperkalemia 05/23/2022   Acute on chronic  diastolic CHF (congestive heart failure) (HCC) 05/21/2022   Mild aortic valve stenosis 12/15/2021   SOBOE (shortness of breath on exertion) 11/19/2021   Pain in joint of left elbow 12/15/2020   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   Tendinitis of left elbow 12/15/2020   Chronic respiratory failure with hypoxia (HCC) 12/13/2020   Chronic obstructive pulmonary disease (HCC) 12/13/2020   Stage 3a chronic kidney disease (CKD) (HCC) 12/24/2019   Chronic systolic heart failure (HCC) 12/24/2019   Hyponatremia 12/24/2019   Type 2 diabetes mellitus with diabetic nephropathy (HCC) 12/24/2019   HYPONATREMIA, CHRONIC 03/12/2007   PERIPHERAL NEUROPATHY 03/12/2007   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 [place] [address] Phone: [phone] Fax: [phone]  [facility] [place], [place] - [address] [place] [address] Phone: [phone] Fax: [phone]  [facility] [address] [place] [address] Phone: [phone] Fax: [phone]     Social Determinants of Health (SDOH) Interventions    Readmission Risk Interventions    05/24/2022   10:29 AM  Readmission Risk Prevention Plan  Transportation Screening Complete  PCP or Specialist Appt within 3-5 Days Complete  HRI or Home Care Consult Complete  Palliative Care Screening Not Applicable  Medication Review (RN Care Manager) Complete

## 2022-05-24 NOTE — Plan of Care (Signed)
  Problem: Education: Goal: Ability to describe self-care measures that may prevent or decrease complications (Diabetes Survival Skills Education) will improve Outcome: Progressing Goal: Individualized Educational Video(s) Outcome: Progressing   Problem: Coping: Goal: Ability to adjust to condition or change in health will improve Outcome: Progressing   Problem: Fluid Volume: Goal: Ability to maintain a balanced intake and output will improve Outcome: Progressing   Problem: Health Behavior/Discharge Planning: Goal: Ability to identify and utilize available resources and services will improve Outcome: Progressing Goal: Ability to manage health-related needs will improve Outcome: Progressing   Problem: Metabolic: Goal: Ability to maintain appropriate glucose levels will improve Outcome: Progressing   Problem: Nutritional: Goal: Maintenance of adequate nutrition will improve Outcome: Progressing Goal: Progress toward achieving an optimal weight will improve Outcome: Progressing   Problem: Skin Integrity: Goal: Risk for impaired skin integrity will decrease Outcome: Progressing   Problem: Tissue Perfusion: Goal: Adequacy of tissue perfusion will improve Outcome: Progressing   Problem: Education: Goal: Knowledge of General Education information will improve Description: Including pain rating scale, medication(s)/side effects and non-pharmacologic comfort measures Outcome: Progressing   Problem: Health Behavior/Discharge Planning: Goal: Ability to manage health-related needs will improve Outcome: Progressing   Problem: Clinical Measurements: Goal: Ability to maintain clinical measurements within normal limits will improve Outcome: Progressing Goal: Will remain free from infection Outcome: Progressing Goal: Diagnostic test results will improve Outcome: Progressing Goal: Respiratory complications will improve Outcome: Progressing Goal: Cardiovascular complication will  be avoided Outcome: Progressing   Problem: Activity: Goal: Risk for activity intolerance will decrease Outcome: Progressing   Problem: Nutrition: Goal: Adequate nutrition will be maintained Outcome: Progressing   Problem: Coping: Goal: Level of anxiety will decrease Outcome: Progressing   Problem: Elimination: Goal: Will not experience complications related to bowel motility Outcome: Progressing Goal: Will not experience complications related to urinary retention Outcome: Progressing   Problem: Pain Managment: Goal: General experience of comfort will improve Outcome: Progressing   Problem: Safety: Goal: Ability to remain free from injury will improve Outcome: Progressing   Problem: Skin Integrity: Goal: Risk for impaired skin integrity will decrease Outcome: Progressing   Problem: Education: Goal: Ability to demonstrate management of disease process will improve Outcome: Progressing Goal: Ability to verbalize understanding of medication therapies will improve Outcome: Progressing Goal: Individualized Educational Video(s) Outcome: Progressing   Problem: Activity: Goal: Capacity to carry out activities will improve Outcome: Progressing   Problem: Cardiac: Goal: Ability to achieve and maintain adequate cardiopulmonary perfusion will improve Outcome: Progressing   Problem: Education: Goal: Knowledge of disease or condition will improve Outcome: Progressing Goal: Knowledge of the prescribed therapeutic regimen will improve Outcome: Progressing Goal: Individualized Educational Video(s) Outcome: Progressing   Problem: Activity: Goal: Ability to tolerate increased activity will improve Outcome: Progressing Goal: Will verbalize the importance of balancing activity with adequate rest periods Outcome: Progressing   Problem: Respiratory: Goal: Ability to maintain a clear airway will improve Outcome: Progressing Goal: Levels of oxygenation will improve Outcome:  Progressing Goal: Ability to maintain adequate ventilation will improve Outcome: Progressing   

## 2022-05-24 NOTE — Progress Notes (Signed)
Mobility Specialist Progress Note: Nurse requested Mobility Specialist to perform oxygen saturation test with pt which includes removing pt from oxygen both at rest and while ambulating.  Below are the results from that testing.     Patient Saturations on Room Air at Rest = spO2 89%  Patient Saturations on Room Air while Ambulating = sp02 85% .  Rested and performed pursed lip breathing for 1 minute with sp02 at 87%.  Patient Saturations on 4 Liters of oxygen while Ambulating = sp02 91%  At end of testing pt left in room on 3  Liters of oxygen.  Reported results to nurse.   [name] Mobility Specialist Please contact via [employer] or  Rehab Office at [phone]

## 2022-05-24 NOTE — Progress Notes (Signed)
Mobility Specialist Progress Note:   05/24/22 1127  Mobility  Activity Ambulated with assistance in hallway  Level of Assistance Standby assist, set-up cues, supervision of patient - no hands on  Assistive Device Four wheel walker  Distance Ambulated (ft) 150 ft  Activity Response Tolerated well  $Mobility charge 1 Mobility   Pt received in bed willing to participate in mobility. No complaints of pain. Left in bed with call bell in reach and all needs met.   Gareth Eagle Teondre Jarosz Mobility Specialist Please contact via [employer] or  Rehab Office at [phone]

## 2022-05-24 NOTE — Discharge Summary (Signed)
Physician Discharge Summary   Patient: [name] MRN: [id number] DOB: 17-Sep-1935  Admit date:     05/20/2022  Discharge date: 05/24/22  Discharge Physician: [name]   PCP: Aaron Mulder, MD     Recommendations at discharge:  Follow up with PCP  Please obtain BMP in 1 week Please remove face sutures between 12/19 and 12/21 Please remove staples after 10-14 days, ~12/28 Follow up with Pulmonology in 3-4 weeks     Discharge Diagnoses: Principal Problem:   Acute on chronic diastolic CHF (congestive heart failure) (HCC) Active Problems:   Hypothyroidism   Chronic respiratory failure with hypoxia (HCC)   Stage 3a chronic kidney disease (CKD) (HCC)   COPD with acute exacerbation (HCC)   Essential hypertension   Type 2 diabetes mellitus with hyperglycemia (HCC)   Scalp laceration   Graves' ophthalmopathy   Hyperkalemia      Hospital Course: [name] is an 86 y.o. M with hx Graves' ophthalmopathy, COPD, CHF came with mechanical fall on forehead.   In the ER, CT maxillary showed no acute fracture,   Found to have COPD exacerbation and CHF flare.      Acute on chronic diastolic CHF Admitted with hypoxia, shortness of breath on exertion, cardiomegaly and pulmonary edema on CXR.  Diuresed 7.4L.  Weaned to home O2.    Discharged on home furosemide.  Recommend BMP in 1 week       COPD exacerbation Presented with increased dyspnea, increased sputum.  Treated with steroids and bronchodilators.    Discharged to complete 5 days prednisone and azithromycin.     Chronic respiratory failure with hypoxia (HCC) Acute respiratory failure ruled out.   Graves' ophthalmopathy Fluorescein staining on admission showed no corneal abrasions   Scalp laceration Remove sutures on 12/21 to 12/23  Wrist laceration Remove staples on 12/28   Right orbital gas Noted incidentally on CT, has chronic appearance                [facility]  Controlled Substances Registry was reviewed for this patient prior to discharge.  Consultants: None Procedures performed: None  Disposition: Home health Diet recommendation:  Discharge Diet Orders (From admission, onward)     Start     Ordered   05/24/22 0000  Diet - low sodium heart healthy        05/24/22 0944             DISCHARGE MEDICATION: Allergies as of 05/24/2022       Reactions   Lodine [etodolac] Itching   Cefaclor Rash        Medication List     STOP taking these medications    Difluprednate 0.05 % Emul   ergocalciferol 1.25 MG (50000 UT) capsule Commonly known as: VITAMIN D2   Exenatide ER 2 MG Pen   tamsulosin 0.4 MG Caps capsule Commonly known as: Flomax       TAKE these medications    albuterol 108 (90 Base) MCG/ACT inhaler Commonly known as: VENTOLIN HFA Inhale 2 puffs into the lungs every 6 (six) hours as needed for wheezing or shortness of breath.   aspirin EC 81 MG tablet Take 81 mg by mouth daily.   azithromycin 250 MG tablet Commonly known as: ZITHROMAX Take 1 tablet (250 mg total) by mouth daily.   brimonidine 0.2 % ophthalmic solution Commonly known as: ALPHAGAN Place 1 drop into the right eye 2 (two) times daily.   carvedilol 25 MG tablet Commonly  known as: COREG Take 25 mg by mouth 2 (two) times daily with a meal.   cholecalciferol 1000 units tablet Commonly known as: VITAMIN D Take 1,000 Units by mouth at bedtime.   citalopram 40 MG tablet Commonly known as: CELEXA Take 40 mg by mouth daily.   fexofenadine 180 MG tablet Commonly known as: ALLEGRA Take 180 mg by mouth daily as needed for allergies.   furosemide 20 MG tablet Commonly known as: LASIX Take 20 mg by mouth 2 (two) times daily.   gabapentin 600 MG tablet Commonly known as: NEURONTIN Take 600 mg by mouth 2 (two) times daily.   glipiZIDE 5 MG tablet Commonly known as: GLUCOTROL Take 5 mg by mouth daily before breakfast.    ipratropium-albuterol 0.5-2.5 (3) MG/3ML Soln Commonly known as: DUONEB Take 3 mLs by nebulization every 4 (four) hours as needed. [id number]   Jardiance 10 MG Tabs tablet Generic drug: empagliflozin Take 10 mg by mouth daily.   levothyroxine 137 MCG tablet Commonly known as: SYNTHROID Take 137 mcg by mouth daily before breakfast.   liothyronine 5 MCG tablet Commonly known as: CYTOMEL Take 5 mcg by mouth daily.   Lumigan 0.01 % Soln Generic drug: bimatoprost Place 1 drop into the right eye daily.   magnesium oxide 400 MG tablet Commonly known as: MAG-OX Take 400 mg by mouth 2 (two) times daily.   montelukast 10 MG tablet Commonly known as: SINGULAIR Take 10 mg by mouth at bedtime.   OneTouch Ultra test strip Generic drug: glucose blood USE AS DIRECTED TO CHECK BLOOD GLUCOSE EVERY DAY   OXYGEN Inhale into the lungs. 2 litre at night   pravastatin 20 MG tablet Commonly known as: PRAVACHOL Take 20 mg by mouth at bedtime.   predniSONE 20 MG tablet Commonly known as: DELTASONE Take 2 tablets (40 mg total) by mouth daily with breakfast. Start taking on: May 25, 2022 What changed:  medication strength how much to take Another medication with the same name was removed. Continue taking this medication, and follow the directions you see here.   quinapril 40 MG tablet Commonly known as: ACCUPRIL Take 20 mg by mouth at bedtime.   ranitidine 150 MG tablet Commonly known as: ZANTAC Take 150 mg by mouth at bedtime.   Roflumilast 250 MCG Tabs TAKE 1 TABLET BY MOUTH EVERY DAY   Toujeo SoloStar 300 UNIT/ML Solostar Pen Generic drug: insulin glargine (1 Unit Dial) Inject 15 Units into the skin at bedtime.   Travoprost (BAK Free) 0.004 % Soln ophthalmic solution Commonly known as: TRAVATAN Place 1 drop into both eyes at bedtime.   Trelegy Ellipta 100-62.5-25 MCG/ACT Aepb Generic drug: Fluticasone-Umeclidin-Vilant Inhale 1 puff into the lungs daily.   Yupelri  175 MCG/3ML nebulizer solution Generic drug: revefenacin Inhale one vial in nebulizer once daily. Do not mix with other nebulized medications. What changed: See the new instructions.               Discharge Care Instructions  (From admission, onward)           Start     Ordered   05/24/22 0000  Discharge wound care:       Comments: Keep all lacerations covered with a nonstick bandage or vaseline gauze, with a simple dressing over it You may wash with soap and water and pat dry daily when you change the dressing  Take out the sutures on the left forehead tomorrow through Thursday Take the out staples on the left hand  at 12-14 days (as directed by the ER doctor)   05/24/22 0944            Follow-up Information     Morayati, Delsa Sale, MD Follow up.   Specialty: Endocrinology Why: Please follow up in a week. Contact information: [address] [place] [address] [phone]         Sallyanne Kuster, NP. Schedule an appointment as soon as possible for a visit in 1 month(s).   Specialty: Nurse Practitioner Contact information: [address] [place] [address] [phone]         [facility] Follow up.   Why: Agency will call you to set up apt times Contact information: [address] [place] [address] [phone]                 Discharge Instructions     Diet - low sodium heart healthy   Complete by: As directed    Discharge instructions   Complete by: As directed    **IMPORTANT DISCHARGE INSTRUCTIONS**   From Dr. Maryfrances Bunnell: YOu were admitted after a fall Thankfully you were not seriously injured in the fall, but you did have low oxygen levels and here we found that you had a small flare of congestive heart failure and a small flare of COPD   For the congestive heart: You were treated with diuretics You should continue your home diuretic furosemide/Lasix 20 mg twice daily Have your primary  doctor send out a nurse, or bring you in to check labs (kidney function) next week   For the COPD, you were treated with steroids and antibiotics Take the antibiotic azithromycin 250 mg for 3 more days in the morning Take the steroid prednisone 40 mg (2 tabs) for 3 more days in the morning   Resume your Trelegy and Yupelri as normal For the next few days, you can take your albuterol three times daily on schedule  (IF this doesn't help, you can stop it)  Call your pulmonologist for an appointment in about 3-4 weeks, to see that you're doing okay Ask if the Trelegy and Yupelri need to be taken together   Discharge wound care:   Complete by: As directed    Keep all lacerations covered with a nonstick bandage or vaseline gauze, with a simple dressing over it You may wash with soap and water and pat dry daily when you change the dressing  Take out the sutures on the left forehead tomorrow through Thursday Take the out staples on the left hand at 12-14 days (as directed by the ER doctor)   Increase activity slowly   Complete by: As directed        Discharge Exam: Filed Weights   05/22/22 0454 05/23/22 0512 05/24/22 0131  Weight: 101.4 kg 101.4 kg 102.7 kg    General: Pt is alert, awake, not in acute distress Cardiovascular: RRR, nl S1-S2, no murmurs appreciated.   No LE edema.   Respiratory: Normal respiratory rate and rhythm.  CTAB without rales or wheezes. Abdominal: Abdomen soft and non-tender.  No distension or HSM.   Neuro/Psych: Strength symmetric in upper and lower extremities.  Judgment and insight appear normal.   Condition at discharge: stable  The results of significant diagnostics from this hospitalization (including imaging, microbiology, ancillary and laboratory) are listed below for reference.   Imaging Studies: ECHOCARDIOGRAM COMPLETE  Result Date: 05/22/2022    ECHOCARDIOGRAM REPORT   Patient Name:   [facility]  L Sieg Date of Exam: 05/22/2022 Medical Rec #:   [id number]     Height:       70.0 in Accession #:    [id number]    Weight:       223.5 lb Date of Birth:  1936/05/07    BSA:          2.188 m Patient Age:    86 years      BP:           126/75 mmHg Patient Gender: M             HR:           76 bpm. Exam Location:  Inpatient Procedure: 2D Echo, Cardiac Doppler and Color Doppler Indications:    CHF-Acute Diastolic I50.31  History:        Patient has prior history of Echocardiogram examinations, most                 recent 11/24/2021. CHF, Aortic Valve Disease,                 Signs/Symptoms:Shortness of Breath; Risk Factors:Former Smoker,                 Diabetes, Dyslipidemia, Hypertension and Sleep Apnea.  Sonographer:    Aron Baba Referring Phys: [id number] Emeline General  Sonographer Comments: Technically challenging study due to limited acoustic windows. Image acquisition challenging due to COPD, Image acquisition challenging due to respiratory motion and Image acquisition challenging due to patient body habitus. IMPRESSIONS  1. Poor acoustic windows  2. Left ventricular ejection fraction, by estimation, is 50%%. The left ventricle has mildly decreased function. Left ventricular endocardial border not optimally defined to evaluate regional wall motion. There is mild left ventricular hypertrophy. Left  ventricular diastolic parameters are consistent with Grade I diastolic dysfunction (impaired relaxation).  3. Right ventricular systolic function is mildly reduced. The right ventricular size is mildly enlarged.  4. Left atrial size was mildly dilated.  5. Right atrial size was mild to moderately dilated.  6. The mitral valve is grossly normal. No evidence of mitral valve regurgitation. No evidence of mitral stenosis.  7. The aortic valve is abnormal. There is moderate calcification of the aortic valve. Aortic valve regurgitation is not visualized. Mild aortic valve stenosis.  8. There is borderline dilatation of the aortic root, measuring 39 mm.  9. The inferior vena  cava is dilated in size with >50% respiratory variability, suggesting right atrial pressure of 8 mmHg. Comparison(s): No significant change from prior study. FINDINGS  Left Ventricle: Left ventricular ejection fraction, by estimation, is 50%%. The left ventricle has mildly decreased function. Left ventricular endocardial border not optimally defined to evaluate regional wall motion. The left ventricular internal cavity size was normal in size. There is mild left ventricular hypertrophy. Abnormal (paradoxical) septal motion, consistent with left bundle branch block. Left ventricular diastolic parameters are consistent with Grade I diastolic dysfunction (impaired relaxation). Right Ventricle: The right ventricular size is mildly enlarged. No increase in right ventricular wall thickness. Right ventricular systolic function is mildly reduced. Left Atrium: Left atrial size was mildly dilated. Right Atrium: Right atrial size was mild to moderately dilated. Pericardium: There is no evidence of pericardial effusion. Mitral Valve: The mitral valve is grossly normal. No evidence of mitral valve regurgitation. No evidence of mitral valve stenosis. Tricuspid Valve: The tricuspid valve is not well visualized. Tricuspid valve regurgitation is not demonstrated. No evidence of tricuspid stenosis. Aortic Valve:  The aortic valve is abnormal. There is moderate calcification of the aortic valve. Aortic valve regurgitation is not visualized. Mild aortic stenosis is present. Aortic valve mean gradient measures 14.0 mmHg. Aortic valve peak gradient measures 23.6 mmHg. Aortic valve area, by VTI measures 1.68 cm. Pulmonic Valve: The pulmonic valve was not well visualized. Pulmonic valve regurgitation is not visualized. No evidence of pulmonic stenosis. Aorta: The aortic root is normal in size and structure. There is borderline dilatation of the aortic root, measuring 39 mm. Venous: The inferior vena cava is dilated in size with greater  than 50% respiratory variability, suggesting right atrial pressure of 8 mmHg. IAS/Shunts: No atrial level shunt detected by color flow Doppler.  LEFT VENTRICLE PLAX 2D LVIDd:         5.30 cm      Diastology LVIDs:         4.00 cm      LV e' medial:    5.98 cm/s LV PW:         1.20 cm      LV E/e' medial:  14.9 LV IVS:        1.30 cm      LV e' lateral:   5.77 cm/s LVOT diam:     2.40 cm      LV E/e' lateral: 15.4 LV SV:         82 LV SV Index:   38 LVOT Area:     4.52 cm  LV Volumes (MOD) LV vol d, MOD A2C: 112.0 ml LV vol d, MOD A4C: 175.0 ml LV vol s, MOD A2C: 80.9 ml LV vol s, MOD A4C: 81.8 ml LV SV MOD A2C:     31.1 ml LV SV MOD A4C:     175.0 ml LV SV MOD BP:      56.6 ml RIGHT VENTRICLE RV S prime:     11.70 cm/s TAPSE (M-mode): 2.6 cm LEFT ATRIUM             Index        RIGHT ATRIUM           Index LA diam:        3.30 cm 1.51 cm/m   RA Area:     23.30 cm LA Vol (A2C):   88.2 ml 40.30 ml/m  RA Volume:   69.40 ml  31.71 ml/m LA Vol (A4C):   76.4 ml 34.91 ml/m LA Biplane Vol: 91.1 ml 41.63 ml/m  AORTIC VALVE AV Area (Vmax):    1.57 cm AV Area (Vmean):   1.51 cm AV Area (VTI):     1.68 cm AV Vmax:           243.00 cm/s AV Vmean:          173.000 cm/s AV VTI:            0.490 m AV Peak Grad:      23.6 mmHg AV Mean Grad:      14.0 mmHg LVOT Vmax:         84.40 cm/s LVOT Vmean:        57.600 cm/s LVOT VTI:          0.182 m LVOT/AV VTI ratio: 0.37  AORTA Ao Root diam: 3.90 cm MITRAL VALVE MV Area (PHT): 3.37 cm     SHUNTS MV Decel Time: 225 msec     Systemic VTI:  0.18 m MV E velocity: 89.10 cm/s   Systemic Diam: 2.40 cm MV A velocity:  109.00 cm/s MV E/A ratio:  0.82 Vishnu Priya Mallipeddi Electronically signed by Winfield Rast Mallipeddi Signature Date/Time: 05/22/2022/4:18:19 PM    Final    DG Chest 1 View  Result Date: 05/22/2022 CLINICAL DATA:  Difficulty breathing, CHF EXAM: CHEST  1 VIEW COMPARISON:  Previous studies including the examination of 05/20/2022 FINDINGS: Transverse diameter of  heart is increased. There is interval decrease in pulmonary vascular congestion. There is improvement in the aeration in lower lung fields. There is residual increased density in medial left lower lung field obscuring the left hemidiaphragm. There is blunting of left lateral CP angle. Right lateral CP angle is not included in the radiograph. There is no pneumothorax. IMPRESSION: There is interval decrease in pulmonary vascular congestion. There is improvement in the aeration of lower lung fields. There is residual increased density in medial left lower lung field suggesting possible atelectasis. There is blunting of lateral CP angles suggesting small pleural effusion. Electronically Signed   By: Ernie Avena M.D.   On: 05/22/2022 08:45   CT Orbits W Contrast  Result Date: 05/21/2022 CLINICAL DATA:  Fall with ocular pain. EXAM: CT ORBITS WITH CONTRAST TECHNIQUE: Multidetector CT images was performed according to the standard protocol following intravenous contrast administration. RADIATION DOSE REDUCTION: This exam was performed according to the departmental dose-optimization program which includes automated exposure control, adjustment of the mA and/or kV according to patient size and/or use of iterative reconstruction technique. CONTRAST:  80mL OMNIPAQUE IOHEXOL 300 MG/ML  SOLN COMPARISON:  None Available. FINDINGS: Orbits: The globes are intact. Optic nerves are normal. Normal extraocular muscles and lacrimal glands. The bony orbit, preseptal soft tissues and the intra- and extraconal orbital fat are normal. Visualized sinuses:  No fluid levels or advanced mucosal thickening. Soft tissues: Left supraorbital scalp laceration. Limited intracranial: Normal.  Intracranial ICA atherosclerosis. IMPRESSION: Left supraorbital scalp laceration without orbital injury. Electronically Signed   By: Deatra Robinson M.D.   On: 05/21/2022 02:29   CT Maxillofacial Wo Contrast  Result Date: 05/21/2022 CLINICAL DATA:   Facial trauma, blunt. Fell at home. Laceration over the left eye. EXAM: CT MAXILLOFACIAL WITHOUT CONTRAST TECHNIQUE: Multidetector CT imaging of the maxillofacial structures was performed. Multiplanar CT image reconstructions were also generated. RADIATION DOSE REDUCTION: This exam was performed according to the departmental dose-optimization program which includes automated exposure control, adjustment of the mA and/or kV according to patient size and/or use of iterative reconstruction technique. COMPARISON:  CT head 08/01/2013 FINDINGS: Osseous: Motion artifact limits evaluation. As visualized, the nasal bones, orbital bones, facial bones, and mandibles appear intact. No acute displaced fractures are identified. Orbits: Configuration of the orbits suggest exophthalmos bilaterally. Correlate with physical examination. The globes appear intact. Extraocular muscles appear somewhat thickened. Consider thyroid ophthalmopathy. No retrobulbar soft tissue infiltration. Sinuses: Paranasal sinuses and mastoid air cells are clear. Soft tissues: Left periorbital soft tissue hematoma. No retro bulbar or intraconal involvement. Small amounts of soft tissue gas are demonstrated inferior to the right globe, possibly indicating penetrating injury or infection. Correlation with physical examination is recommended. Limited intracranial: No acute abnormality. IMPRESSION: 1. No acute displaced orbital or facial fractures identified. 2. Left periorbital soft tissue hematoma. No retrobulbar involvement. 3. Tiny gas demonstrated inferior to the right orbit may represent normal variation but could indicate penetrating injury or infection. Correlate with physical examination. 4. Suggestion of exophthalmos with thickened extraocular muscles. Correlate with physical examination. Consider thyroid ophthalmopathy. Electronically Signed   By: Burman Nieves M.D.   On: 05/21/2022 00:01  CT Cervical Spine Wo Contrast  Result Date:  05/20/2022 CLINICAL DATA:  Neck trauma after a fall. EXAM: CT CERVICAL SPINE WITHOUT CONTRAST TECHNIQUE: Multidetector CT imaging of the cervical spine was performed without intravenous contrast. Multiplanar CT image reconstructions were also generated. RADIATION DOSE REDUCTION: This exam was performed according to the departmental dose-optimization program which includes automated exposure control, adjustment of the mA and/or kV according to patient size and/or use of iterative reconstruction technique. COMPARISON:  None Available. FINDINGS: Alignment: Straightening of usual cervical lordosis. This is likely positional or degenerative but could indicate muscle spasm. No anterior subluxations. Normal alignment of the facet joints. Skull base and vertebrae: The skull base appears intact. No vertebral compression deformities. No focal bone lesion or bone destruction. Bone cortex appears intact. Soft tissues and spinal canal: No prevertebral soft tissue swelling. No abnormal paraspinal soft tissue mass or infiltration. Disc levels: Degenerative changes throughout with disc space narrowing and prominent endplate osteophyte formation. Degenerative changes throughout the posterior facet joints. Uncovertebral and facet joint spurring causes bone encroachment upon neural foramina bilaterally. Upper chest: Emphysematous changes and fibrosis in the lung apices. Other: Vascular calcification in the cervical carotid arteries with suggestion of at least moderate stenosis of bilateral distal common carotid and proximal internal carotid arteries. IMPRESSION: 1. Nonspecific straightening of usual cervical lordosis. No acute displaced fractures identified. 2. Prominent degenerative changes in the cervical spine. 3. Prominent vascular calcification in the cervical carotid arteries. Electronically Signed   By: Burman Nieves M.D.   On: 05/20/2022 23:56   CT Head Wo Contrast  Result Date: 05/20/2022 CLINICAL DATA:  Head trauma,  moderate to severe. Fall. No loss of consciousness. EXAM: CT HEAD WITHOUT CONTRAST TECHNIQUE: Contiguous axial images were obtained from the base of the skull through the vertex without intravenous contrast. RADIATION DOSE REDUCTION: This exam was performed according to the departmental dose-optimization program which includes automated exposure control, adjustment of the mA and/or kV according to patient size and/or use of iterative reconstruction technique. COMPARISON:  CT head 08/01/2013.  MRI 08/01/2013 FINDINGS: Brain: Diffuse cerebral atrophy. Ventricular dilatation consistent with central atrophy. Low-attenuation changes in the deep white matter consistent with small vessel ischemia. No abnormal extra-axial fluid collections. No mass effect or midline shift. Gray-white matter junctions are distinct. Basal cisterns are not effaced. No acute intracranial hemorrhage. Vascular: Intracranial vascular calcifications. Skull: Normal. Negative for fracture or focal lesion. Sinuses/Orbits: Paranasal sinuses and mastoid air cells are clear. Other: Subcutaneous scalp laceration over the left supraorbital region. Left periorbital soft tissue hematoma. IMPRESSION: 1. No acute intracranial abnormalities. 2. Diffuse atrophy and small vessel ischemic changes. Electronically Signed   By: Burman Nieves M.D.   On: 05/20/2022 23:53   DG Chest 2 View  Result Date: 05/20/2022 CLINICAL DATA:  Fall.  History of COPD. EXAM: CHEST - 2 VIEW COMPARISON:  12/09/2021 FINDINGS: The heart is enlarged. There are bilateral pleural effusions and fluid in the fissures. Peribronchial thickening which appears similar to prior exam and may be chronic. No pneumothorax. No evidence of acute airspace disease. IMPRESSION: 1. Cardiomegaly with bilateral pleural effusions and fluid in the fissures, suspicious for CHF. 2. Peribronchial thickening appears similar to prior exam and may be chronic, either pulmonary edema or related COPD.  Electronically Signed   By: Narda Rutherford M.D.   On: 05/20/2022 23:38   DG Forearm Left  Result Date: 05/20/2022 CLINICAL DATA:  Fall. Left forearm laceration. EXAM: LEFT FOREARM - 2 VIEW COMPARISON:  None Available. FINDINGS: The  cortical margins of the radius and ulna are intact. There is no evidence of fracture or other focal bone lesions. Elbow alignment is maintained with mild degenerative spurring. Mild soft tissue edema. IMPRESSION: No fracture of the left forearm. Mild soft tissue edema. Electronically Signed   By: Narda Rutherford M.D.   On: 05/20/2022 23:36   DG Wrist Complete Left  Result Date: 05/20/2022 CLINICAL DATA:  Status post fall, left hand skin tear. EXAM: LEFT WRIST - COMPLETE 3+ VIEW COMPARISON:  None Available. FINDINGS: There is no evidence of fracture or dislocation. 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 Dorsal soft tissue edema overlies the metacarpals. Mild soft tissue edema about the wrist. IMPRESSION: 1. Dorsal soft tissue edema without acute fracture or dislocation. 2. 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 Electronically Signed   By: Narda Rutherford M.D.   On: 05/20/2022 23:29    Microbiology: Results for orders placed or performed during the hospital encounter of 04/10/21  Culture, blood (routine x 2)     Status: None   Collection Time: 04/10/21  5:26 PM   Specimen: BLOOD  Result Value Ref Range Status   Specimen Description   Final    BLOOD RIGHT ANTECUBITAL Performed at [facility], [address] [place], [place] [address]    Special Requests   Final    BOTTLES DRAWN AEROBIC AND ANAEROBIC Blood Culture adequate volume Performed at [facility], [address], [place], [place] [address]    Culture   Final    NO GROWTH 5 DAYS Performed at [facility], [address], [place], [place] [address]    Report  Status 04/15/2021 FINAL  Final  Resp Panel by RT-PCR (Flu A&B, Covid) Nasopharyngeal Swab     Status: None   Collection Time: 04/10/21  5:26 PM   Specimen: Nasopharyngeal Swab; Nasopharyngeal(NP) swabs in vial transport medium  Result Value Ref Range Status   SARS Coronavirus 2 by RT PCR NEGATIVE NEGATIVE Final    Comment: (NOTE) SARS-CoV-2 target nucleic acids are NOT DETECTED.  The SARS-CoV-2 RNA is generally detectable in upper respiratory specimens during the acute phase of infection. The lowest concentration of SARS-CoV-2 viral copies this assay can detect is 138 copies/mL. A negative result does not preclude SARS-Cov-2 infection and should not be used as the sole basis for treatment or other patient management decisions. A negative result may occur with  improper specimen collection/handling, submission of specimen other than nasopharyngeal swab, presence of viral mutation(s) within the areas targeted by this assay, and inadequate number of viral copies(<138 copies/mL). A negative result must be combined with clinical observations, patient history, and epidemiological information. The expected result is Negative.  Fact Sheet for Patients:  BloggerCourse.com  Fact Sheet for Healthcare Providers:  SeriousBroker.it  This test is no t yet approved or cleared by the [place] FDA and  has been authorized for detection and/or diagnosis of SARS-CoV-2 by FDA under an Emergency Use Authorization (EUA). This EUA will remain  in effect (meaning this test can be used) for the duration of the COVID-19 declaration under Section 564(b)(1) of the Act, 21 U.S.C.section 360bbb-3(b)(1), unless the authorization is terminated  or revoked sooner.       Influenza A by PCR NEGATIVE NEGATIVE Final   Influenza B by PCR NEGATIVE NEGATIVE Final    Comment: (NOTE) The Xpert Xpress SARS-CoV-2/FLU/RSV plus assay is intended as an aid in the  diagnosis of influenza  from Nasopharyngeal swab specimens and should not be used as a sole basis for treatment. Nasal washings and aspirates are unacceptable for Xpert Xpress SARS-CoV-2/FLU/RSV testing.  Fact Sheet for Patients: BloggerCourse.com  Fact Sheet for Healthcare Providers: SeriousBroker.it  This test is not yet approved or cleared by the [place] FDA and has been authorized for detection and/or diagnosis of SARS-CoV-2 by FDA under an Emergency Use Authorization (EUA). This EUA will remain in effect (meaning this test can be used) for the duration of the COVID-19 declaration under Section 564(b)(1) of the Act, 21 U.S.C. section 360bbb-3(b)(1), unless the authorization is terminated or revoked.  Performed at [facility], [address], [place], [place] [address]   Culture, blood (routine x 2)     Status: None   Collection Time: 04/10/21  5:28 PM   Specimen: BLOOD  Result Value Ref Range Status   Specimen Description   Final    BLOOD BLOOD RIGHT HAND Performed at [facility], [address] [place], [place] [address]    Special Requests   Final    BOTTLES DRAWN AEROBIC AND ANAEROBIC Blood Culture adequate volume Performed at [facility], [address], [place], [place] [address]    Culture   Final    NO GROWTH 5 DAYS Performed at [facility], [address], [place], [place] [address]    Report Status 04/15/2021 FINAL  Final    Labs: CBC: Recent Labs  Lab 05/21/22 0043 05/22/22 0046 05/23/22 0038  WBC 9.7 9.8 13.9*  NEUTROABS 7.2  --   --   HGB 11.9* 12.2* 11.1*  HCT 37.0* 37.9* 33.1*  MCV 100.0 100.8* 97.6  PLT 218 207 224   Basic Metabolic Panel: Recent Labs  Lab 05/21/22 0043 05/22/22 0046 05/23/22 0038 05/24/22 0104  NA 134* 136 135 133*  K 4.5 5.6* 4.2 3.8  CL 91* 90* 92* 90*  CO2 33* 35* 34* 32  GLUCOSE 204* 246*  161* 182*  BUN 27* 22 33* 39*  CREATININE 1.45* 1.32* 1.35* 1.41*  CALCIUM 9.1 8.6* 8.3* 8.6*   Liver Function Tests: No results for input(s): "AST", "ALT", "ALKPHOS", "BILITOT", "PROT", "ALBUMIN" in the last 168 hours. CBG: Recent Labs  Lab 05/23/22 1200 05/23/22 1635 05/23/22 2108 05/24/22 0608 05/24/22 1158  GLUCAP 252* 187* 230* 150* 229*    Discharge time spent: approximately 35 minutes spent on discharge counseling, evaluation of patient on day of discharge, and coordination of discharge planning with nursing, social work, pharmacy and case management  Signed: Alberteen Sam, MD [facility] 05/24/2022

## 2022-05-24 NOTE — Care Management Important Message (Signed)
Important Message  Patient Details  Name: [name] MRN: [id number] Date of Birth: 06-11-1935   Medicare Important Message Given:  Yes     [name] 05/24/2022, 12:31 PM

## 2022-05-24 NOTE — TOC Transition Note (Signed)
Transition of Care [facility]) - CM/SW Discharge Note   Patient Details  Name: [name] MRN: [id number] Date of Birth: [birth year]  [facility] CM/SW Contact:  Leone Haven, RN Phone Number: 05/24/2022, 10:38 AM   Clinical Narrative:    Patient is for dc today, he is set up with [facility], wife will transport him home today.    Final next level of care: Home w Home Health Services Barriers to Discharge: No Barriers Identified   Patient Goals and CMS Choice Patient states their goals for this hospitalization and ongoing recovery are:: return home CMS Medicare.gov Compare Post Acute Care list provided to:: Patient Choice offered to / list presented to : Patient  Discharge Placement                       Discharge Plan and Services In-house Referral: NA Discharge Planning Services: CM Consult Post Acute Care Choice: Home Health          DME Arranged: N/A DME Agency: NA       HH Arranged: PT, OT HH Agency: [facility] Date [facility] Agency Contacted: 05/24/22 Time HH Agency Contacted: 1033 Representative spoke with at [facility] Agency: Amy  Social Determinants of Health (SDOH) Interventions     Readmission Risk Interventions    05/24/2022   10:29 AM  Readmission Risk Prevention Plan  Transportation Screening Complete  PCP or Specialist Appt within 3-5 Days Complete  HRI or Home Care Consult Complete  Palliative Care Screening Not Applicable  Medication Review (RN Care Manager) Complete

## 2022-05-25 LAB — HEMOGLOBIN A1C
Hgb A1c MFr Bld: 7.6 % — ABNORMAL HIGH (ref 4.8–5.6)
Mean Plasma Glucose: 171 mg/dL

## 2022-08-10 ENCOUNTER — Telehealth: Payer: Self-pay | Admitting: Internal Medicine

## 2022-08-10 NOTE — Telephone Encounter (Signed)
Lvm to schedule pulmonary follow up-Toni

## 2022-08-17 ENCOUNTER — Other Ambulatory Visit: Payer: Self-pay | Admitting: Nurse Practitioner

## 2022-08-17 DIAGNOSIS — J449 Chronic obstructive pulmonary disease, unspecified: Secondary | ICD-10-CM

## 2022-08-23 ENCOUNTER — Other Ambulatory Visit: Payer: Self-pay | Admitting: Nurse Practitioner

## 2022-08-23 DIAGNOSIS — J018 Other acute sinusitis: Secondary | ICD-10-CM

## 2022-11-03 ENCOUNTER — Other Ambulatory Visit: Payer: Self-pay | Admitting: Nurse Practitioner

## 2022-11-03 ENCOUNTER — Telehealth: Payer: Self-pay

## 2022-11-03 DIAGNOSIS — J449 Chronic obstructive pulmonary disease, unspecified: Secondary | ICD-10-CM

## 2022-11-03 NOTE — Telephone Encounter (Signed)
Lmom with pt call us back need appt for med refills

## 2022-11-06 IMAGING — CT CT RENAL STONE PROTOCOL
2 of 4 series · 16 of 46 positions shown, 18 images · non-contrast
Comparison: None.

CLINICAL DATA: Bladder neck obstruction.  Unable to urinate.

EXAM:
CT ABDOMEN AND PELVIS WITHOUT CONTRAST
TECHNIQUE: Multidetector CT imaging of the abdomen and pelvis was performed
following the standard protocol without IV contrast.

[Series 2: stone full · axial · 0.82mm/px · z∈[+907,+1312]mm · 13 of 89 slices shown, 15 images]
[im 4/89  soft-tissue]
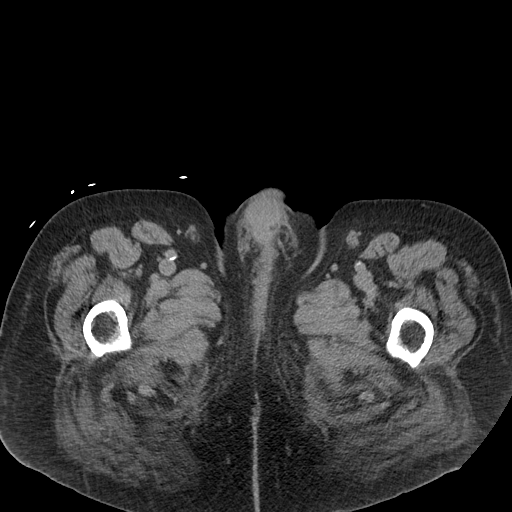
[im 4/89  bone]
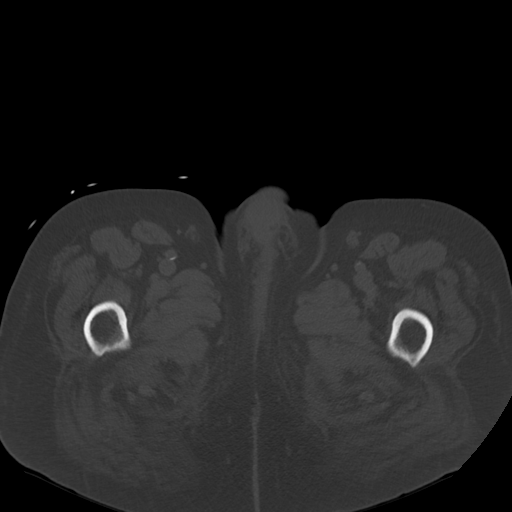
[im 11/89  soft-tissue]
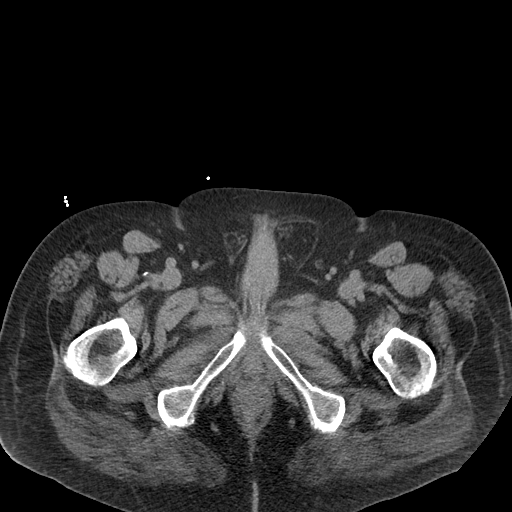
[im 17/89  soft-tissue]
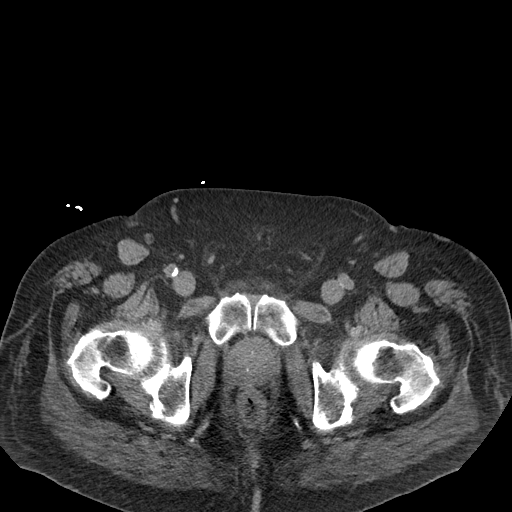
[im 24/89  soft-tissue]
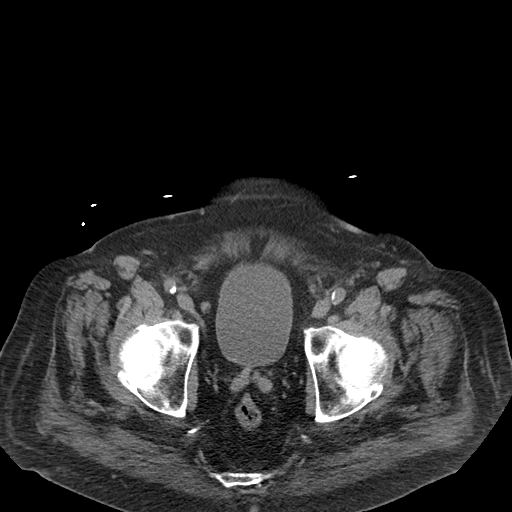
[im 31/89  soft-tissue]
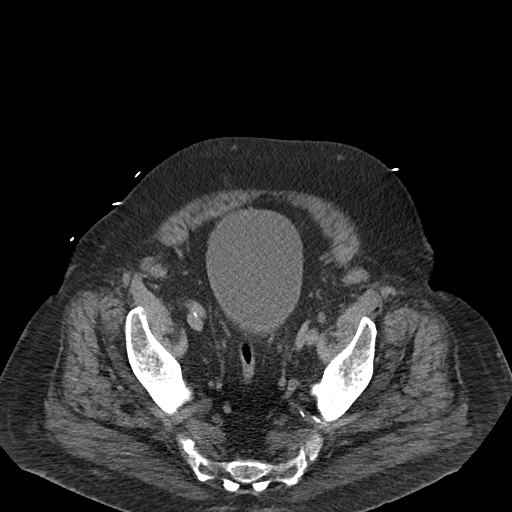
[im 38/89  soft-tissue]
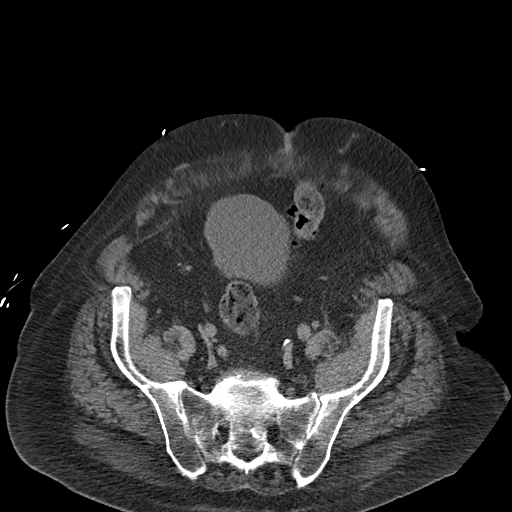
[im 45/89  soft-tissue]
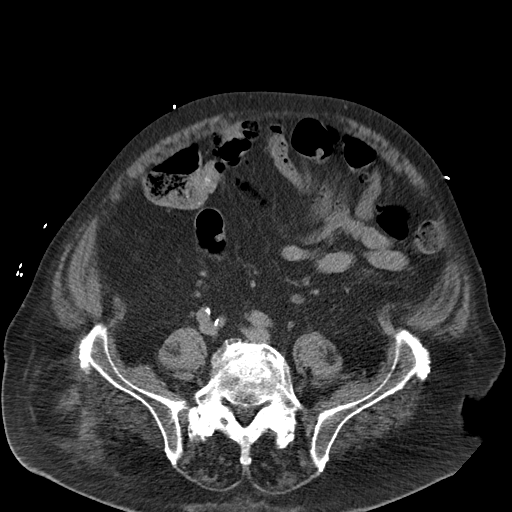
[im 51/89  soft-tissue]
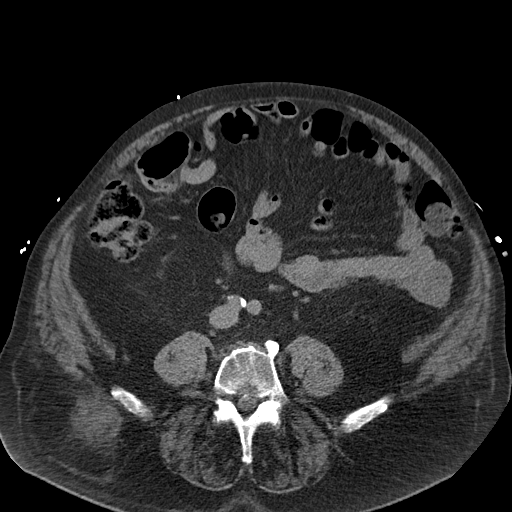
[im 58/89  soft-tissue]
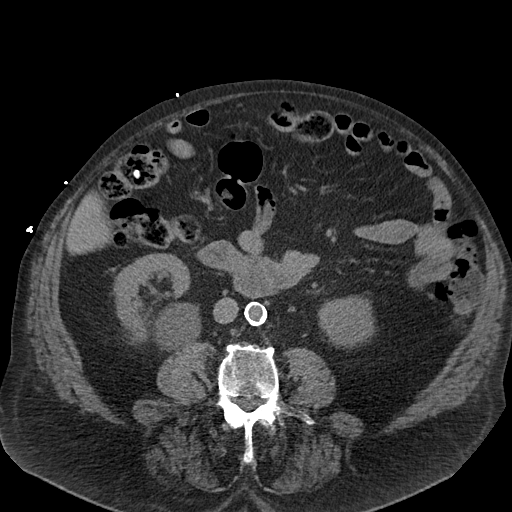
[im 58/89  bone]
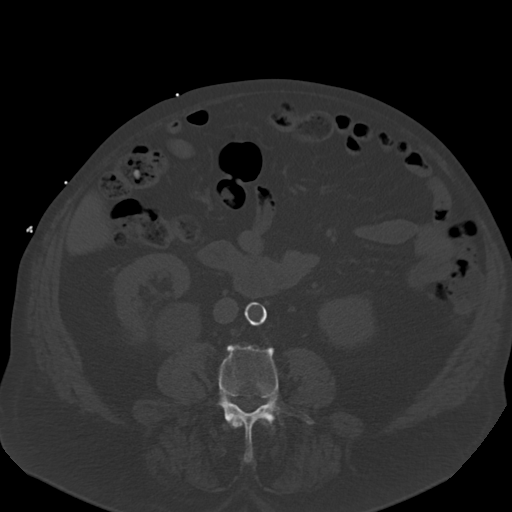
[im 65/89  soft-tissue]
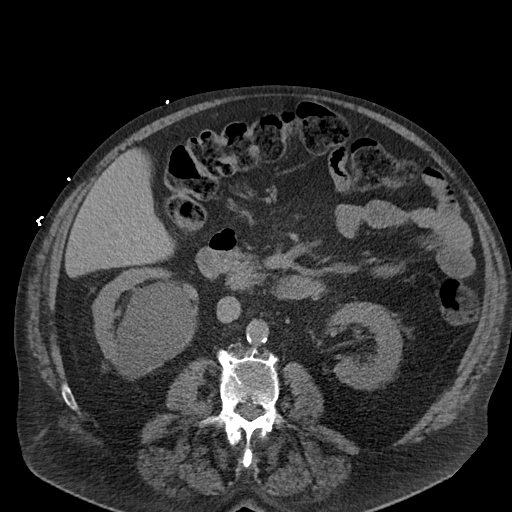
[im 72/89  soft-tissue]
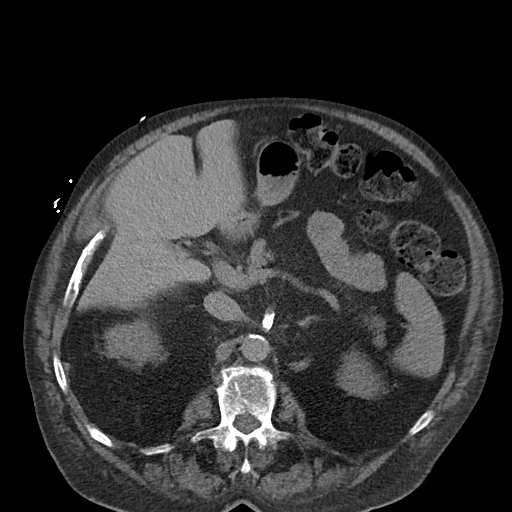
[im 78/89  soft-tissue]
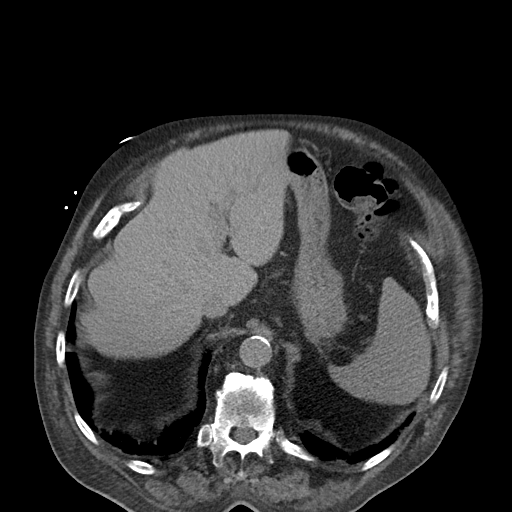
[im 85/89  soft-tissue]
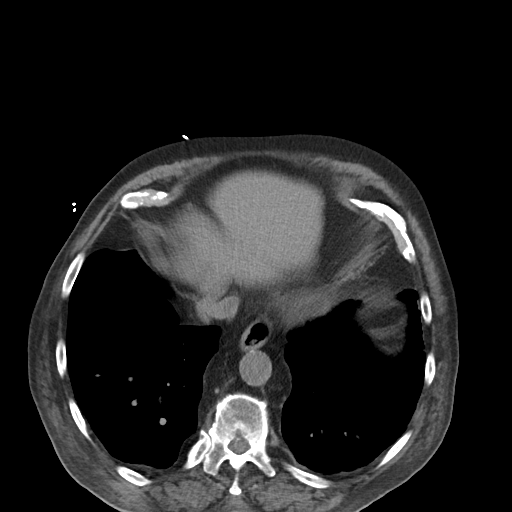

[Series 5: coronal · coronal · 0.87mm/px · 3 of 132 slices shown]
[im 44/132  soft-tissue]
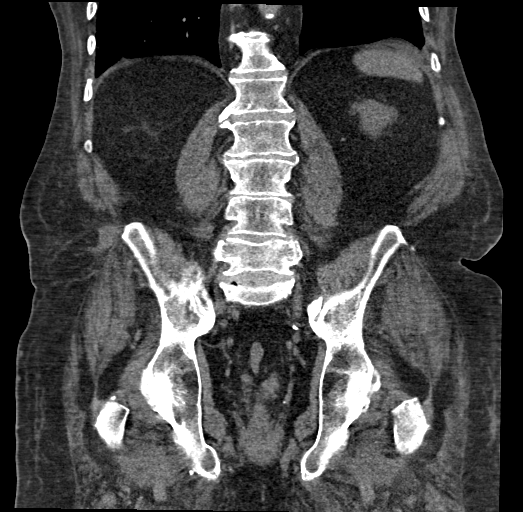
[im 59/132  soft-tissue]
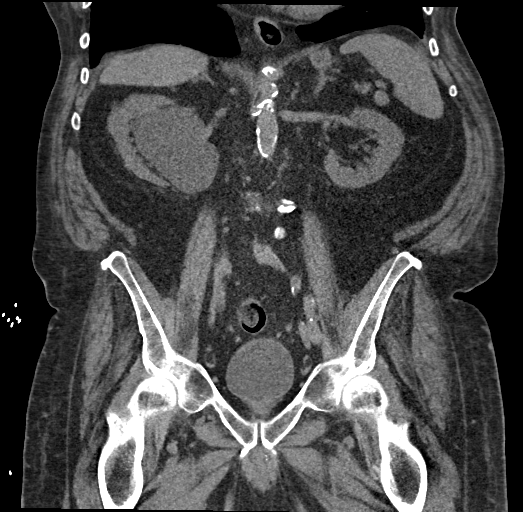
[im 73/132  soft-tissue]
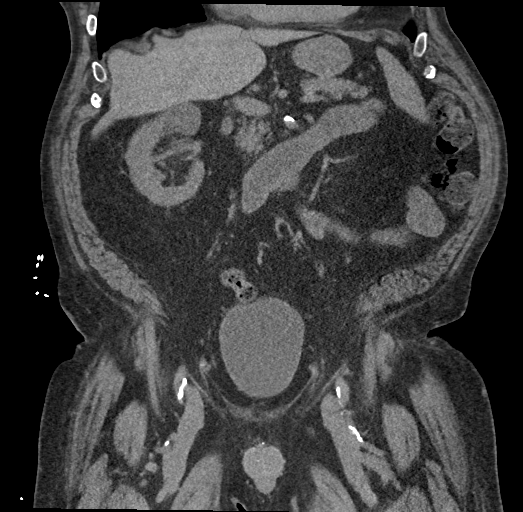

[16 of 46 positions shown; findings below may reference images not displayed]

FINDINGS: Lower chest: No acute abnormality.

Hepatobiliary: No focal liver abnormality is seen. Status post
cholecystectomy. No biliary dilatation.

Pancreas: Small rounded density adjacent to the tail the pancreas is
favored as a splenule. Pancreas appears within normal limits.

Spleen: Normal in size without focal abnormality.

Adrenals/Urinary Tract: There is a single punctate nonobstructing
left renal calculus. Otherwise, the left kidney is within normal
limits. Within the central left kidney there is a 9.8 x 5.3 x 8.6 cm
cystic area. This may represent a large peripelvic cysts. No
definite hydronephrosis or urinary tract calculus.

Bladder is distended, but otherwise within normal limits.

The adrenal glands are within normal limits.

Stomach/Bowel: Stomach is within normal limits. Appendix is not
seen. No evidence of bowel wall thickening, distention, or
inflammatory changes. There is sigmoid colon diverticulosis without
evidence for acute diverticulitis.

Vascular/Lymphatic: Aortic atherosclerosis. No enlarged abdominal or
pelvic lymph nodes.

Reproductive: Prostate is unremarkable.

Other: There is a small fat containing left inguinal hernia. There
is no ascites or free air. Within the subcutaneous tissues of the
superior left buttocks there is a 3.2 x 7.6 by 3.5 cm fluid
attenuation area.

Musculoskeletal: Multilevel degenerative changes affect the spine.
There is mild chronic compression deformity of the superior endplate
of L5.
IMPRESSION: 1. The bladder is distended, but otherwise appears within normal
limits.
2. No hydronephrosis.
3. Large right peripelvic cyst measuring 9.8 cm.
4. Nonobstructing left renal calculus.
5. Sigmoid colon diverticulosis.
6. Subcutaneous fluid collection right buttocks, indeterminate.
Findings may represent resolving hematoma or other fluid collection.
7.  Aortic Atherosclerosis ([id number]).

## 2022-11-09 ENCOUNTER — Other Ambulatory Visit: Payer: Self-pay | Admitting: Nurse Practitioner

## 2022-11-09 DIAGNOSIS — J449 Chronic obstructive pulmonary disease, unspecified: Secondary | ICD-10-CM

## 2022-11-09 DIAGNOSIS — J9611 Chronic respiratory failure with hypoxia: Secondary | ICD-10-CM

## 2022-11-10 ENCOUNTER — Telehealth: Payer: Self-pay | Admitting: Internal Medicine

## 2022-11-10 NOTE — Telephone Encounter (Signed)
Pt says that he sees a different pulm doctor-nm

## 2023-04-27 ENCOUNTER — Telehealth: Payer: Self-pay | Admitting: Podiatry

## 2023-04-27 ENCOUNTER — Ambulatory Visit (INDEPENDENT_AMBULATORY_CARE_PROVIDER_SITE_OTHER): Payer: Medicare Other | Admitting: Podiatry

## 2023-04-27 ENCOUNTER — Encounter: Payer: Self-pay | Admitting: Podiatry

## 2023-04-27 DIAGNOSIS — B351 Tinea unguium: Secondary | ICD-10-CM

## 2023-04-27 DIAGNOSIS — Z794 Long term (current) use of insulin: Secondary | ICD-10-CM | POA: Insufficient documentation

## 2023-04-27 DIAGNOSIS — E119 Type 2 diabetes mellitus without complications: Secondary | ICD-10-CM | POA: Insufficient documentation

## 2023-04-27 DIAGNOSIS — M79676 Pain in unspecified toe(s): Secondary | ICD-10-CM | POA: Diagnosis not present

## 2023-04-27 NOTE — Telephone Encounter (Signed)
Pt called and was just seen and forgot to ask you about the main thing he came in for and would like to you to call him or do a virtual visit.

## 2023-04-27 NOTE — Progress Notes (Signed)
Subjective:  Patient ID: [name], male    DOB: [birth year],  MRN: [id number] HPI Chief Complaint  Patient presents with   Debridement    Patient states he might have an ingrown hallux left, toenail is really thick and discolored, diabetic   New Patient (Initial Visit)    87 y.o. male presents with the above complaint.   Chief complaint: He presents today for a chief complaint of a painful hallux nail left foot.  He says I think I have an ingrown.  ROS: Denies fever chills nausea vomit muscle aches pains calf pain back pain chest pain shortness of breath.  Past Medical History:  Diagnosis Date   Arthritis    Asthma    CHF (congestive heart failure) (HCC)    COPD (chronic obstructive pulmonary disease) (HCC)    Diabetes mellitus without complication (HCC)    GERD (gastroesophageal reflux disease)    H/O Graves' disease    H/O wheezing    HOH (hard of hearing)    Hypercholesterolemia    Hypertension    Hypothyroidism    Lower extremity edema    Multiple allergies    Palpitations    Peripheral vascular disease (HCC)    swelling of feet and legs   Pneumonia    in the past   PONV (postoperative nausea and vomiting)    Shortness of breath dyspnea    Sleep apnea    CPAP   Past Surgical History:  Procedure Laterality Date   CARDIAC CATHETERIZATION     CATARACT EXTRACTION W/PHACO Right 09/01/2015   Procedure: CATARACT EXTRACTION PHACO AND INTRAOCULAR LENS PLACEMENT (IOC);  Surgeon: Sallee Lange, MD;  Location: [facility] ORS;  Service: Ophthalmology;  Laterality: Right;  [place] 01:33 AP% 24.9 CDE 42.99 fluid pack lot # [id number]   CATARACT EXTRACTION W/PHACO Left 09/22/2015   Procedure: CATARACT EXTRACTION PHACO AND INTRAOCULAR LENS PLACEMENT (IOC);  Surgeon: Sallee Lange, MD;  Location: [facility] ORS;  Service: Ophthalmology;  Laterality: Left;  [place] 01:28 AP% 20.9 CDE 29.54 fluid pack lot # [id number]   CHOLECYSTECTOMY     EYE SURGERY     Orbital Decompression      TONSILLECTOMY      Current Outpatient Medications:    SIMBRINZA 1-0.2 % SUSP, 1 drop into affected eye Ophthalmic Three times a day, Disp: , Rfl:    albuterol (VENTOLIN HFA) 108 (90 Base) MCG/ACT inhaler, Inhale 2 puffs into the lungs every 6 (six) hours as needed for wheezing or shortness of breath., Disp: 8 g, Rfl: 5   aspirin EC 81 MG tablet, Take 81 mg by mouth daily., Disp: , Rfl:    azithromycin (ZITHROMAX) 250 MG tablet, Take 1 tablet (250 mg total) by mouth daily., Disp: 3 tablet, Rfl: 0   brimonidine (ALPHAGAN) 0.2 % ophthalmic solution, Place 1 drop into the right eye 2 (two) times daily., Disp: , Rfl:    carvedilol (COREG) 25 MG tablet, Take 25 mg by mouth 2 (two) times daily with a meal., Disp: , Rfl:    cholecalciferol (VITAMIN D) 1000 units tablet, Take 1,000 Units by mouth at bedtime. , Disp: , Rfl:    citalopram (CELEXA) 40 MG tablet, Take 40 mg by mouth daily., Disp: , Rfl:    fexofenadine (ALLEGRA) 180 MG tablet, Take 180 mg by mouth daily as needed for allergies., Disp: , Rfl:    Fluticasone-Umeclidin-Vilant (TRELEGY ELLIPTA) 100-62.5-25 MCG/ACT AEPB, INHALE 1 PUFF INTO THE LUNGS DAILY, Disp: 1 each, Rfl: 0   furosemide (LASIX)  20 MG tablet, Take 20 mg by mouth 2 (two) times daily., Disp: , Rfl:    gabapentin (NEURONTIN) 600 MG tablet, Take 600 mg by mouth 2 (two) times daily., Disp: , Rfl:    glipiZIDE (GLUCOTROL) 5 MG tablet, Take 5 mg by mouth daily before breakfast., Disp: , Rfl:    ipratropium-albuterol (DUONEB) 0.5-2.5 (3) MG/3ML SOLN, Take 3 mLs by nebulization every 4 (four) hours as needed. [id number], Disp: 360 mL, Rfl: 3   JARDIANCE 10 MG TABS tablet, Take 10 mg by mouth daily., Disp: , Rfl:    levothyroxine (SYNTHROID, LEVOTHROID) 137 MCG tablet, Take 137 mcg by mouth daily before breakfast., Disp: , Rfl:    liothyronine (CYTOMEL) 5 MCG tablet, Take 5 mcg by mouth daily., Disp: , Rfl:    LUMIGAN 0.01 % SOLN, Place 1 drop into the right eye daily., Disp: , Rfl:     magnesium oxide (MAG-OX) 400 MG tablet, Take 400 mg by mouth 2 (two) times daily., Disp: , Rfl:    montelukast (SINGULAIR) 10 MG tablet, Take 10 mg by mouth at bedtime., Disp: , Rfl:    ONETOUCH ULTRA test strip, USE AS DIRECTED TO CHECK BLOOD GLUCOSE EVERY DAY, Disp: , Rfl:    OXYGEN, Inhale into the lungs. 2 litre at night, Disp: , Rfl:    pravastatin (PRAVACHOL) 20 MG tablet, Take 20 mg by mouth at bedtime., Disp: , Rfl:    predniSONE (DELTASONE) 20 MG tablet, Take 2 tablets (40 mg total) by mouth daily with breakfast., Disp: 6 tablet, Rfl: 0   quinapril (ACCUPRIL) 40 MG tablet, Take 20 mg by mouth at bedtime., Disp: , Rfl:    ranitidine (ZANTAC) 150 MG tablet, Take 150 mg by mouth at bedtime. (Patient not taking: Reported on 05/22/2022), Disp: , Rfl:    RHOPRESSA 0.02 % SOLN, Apply 1 drop to eye daily., Disp: , Rfl:    Roflumilast 250 MCG TABS, TAKE 1 TABLET BY MOUTH EVERY DAY, Disp: 28 tablet, Rfl: 0   TOUJEO SOLOSTAR 300 UNIT/ML Solostar Pen, Inject 15 Units into the skin at bedtime., Disp: , Rfl:    Travoprost, BAK Free, (TRAVATAN) 0.004 % SOLN ophthalmic solution, Place 1 drop into both eyes at bedtime., Disp: , Rfl:    YUPELRI 175 MCG/3ML nebulizer solution, Inhale one vial in nebulizer once daily. Do not mix with other nebulized medications. (Patient taking differently: Take 175 mcg by nebulization daily.), Disp: 30 mL, Rfl: 11  Allergies  Allergen Reactions   Lodine [Etodolac] Itching   Cefaclor Rash   Review of Systems Objective:  There were no vitals filed for this visit.  General: Well developed, nourished, in no acute distress, alert and oriented x3   Dermatological: Skin is warm, dry and supple bilateral.  Nails are thick yellow dystrophic clinically mycotic particularly the hallux left demonstrates a thicker nail with a ingrowing tip of the nail.  It was not breaking the skin but was putting pressure on the end of the toe.  Remaining integument appears unremarkable at this  time. There are no open sores, no preulcerative lesions, no rash or signs of infection present.  Vascular: Dorsalis Pedis artery and Posterior Tibial artery pedal pulses are 2/4 bilateral with immedate capillary fill time. Pedal hair growth present. No varicosities and no lower extremity edema present bilateral.   Neruologic: Grossly intact via light touch bilateral. Vibratory intact via tuning fork bilateral. Protective threshold with Semmes Wienstein monofilament intact to all pedal sites bilateral. Patellar and Achilles deep  tendon reflexes 2+ bilateral. No Babinski or clonus noted bilateral.   Musculoskeletal: No gross boney pedal deformities bilateral. No pain, crepitus, or limitation noted with foot and ankle range of motion bilateral. Muscular strength 5/5 in all groups tested bilateral.  Gait: Unassisted, Nonantalgic.    Radiographs:  None taken  Assessment & Plan:   Assessment: Diabetes mellitus painful ingrown toenail hallux left nail dystrophy 1 through 5 bilateral  Plan: Debrided toenails 1 through 5 bilateral.     Armari Fussell T. [place], [place]

## 2023-04-29 NOTE — Telephone Encounter (Signed)
Left message for pt to call to schedule an appt in  office with Dr Al Corpus. Per his request.

## 2023-05-18 ENCOUNTER — Ambulatory Visit (INDEPENDENT_AMBULATORY_CARE_PROVIDER_SITE_OTHER): Payer: Medicare Other | Admitting: Podiatry

## 2023-05-18 ENCOUNTER — Encounter: Payer: Self-pay | Admitting: Podiatry

## 2023-05-18 DIAGNOSIS — M778 Other enthesopathies, not elsewhere classified: Secondary | ICD-10-CM

## 2023-05-18 DIAGNOSIS — G5791 Unspecified mononeuropathy of right lower limb: Secondary | ICD-10-CM

## 2023-05-18 MED ORDER — DEXAMETHASONE SODIUM PHOSPHATE 120 MG/30ML IJ SOLN
2.0000 mg | Freq: Once | INTRAMUSCULAR | Status: AC
Start: 2023-05-18 — End: 2023-05-18
  Administered 2023-05-18: 2 mg via INTRA_ARTICULAR

## 2023-05-18 NOTE — Progress Notes (Signed)
He presents today states he has an electrical shooting pain across the top of his hallux right.  He states that has not happened lately but he wants to know why is having what we can do about it.  Objective: Vital signs stable he is alert oriented x 3 he has pain on palpation to the medial dorsal cutaneous nerve at the level of the proximal phalanx hallux right.  Assessment: Neuritis hallux right.  Plan: Injected dexamethasone 2 mg point maximal tenderness.  Follow-up with him as needed.

## 2023-10-01 ENCOUNTER — Other Ambulatory Visit: Payer: Self-pay | Admitting: Nurse Practitioner

## 2023-10-01 DIAGNOSIS — J449 Chronic obstructive pulmonary disease, unspecified: Secondary | ICD-10-CM

## 2023-10-01 DIAGNOSIS — J9611 Chronic respiratory failure with hypoxia: Secondary | ICD-10-CM

## 2023-11-05 ENCOUNTER — Other Ambulatory Visit: Payer: Self-pay

## 2023-11-05 ENCOUNTER — Emergency Department

## 2023-11-05 ENCOUNTER — Emergency Department
Admission: EM | Admit: 2023-11-05 | Discharge: 2023-11-05 | Disposition: A | Discharge status: Home / Self Care | Attending: Emergency Medicine | Admitting: Emergency Medicine

## 2023-11-05 DIAGNOSIS — K5900 Constipation, unspecified: Secondary | ICD-10-CM | POA: Insufficient documentation

## 2023-11-05 DIAGNOSIS — R109 Unspecified abdominal pain: Secondary | ICD-10-CM | POA: Diagnosis not present

## 2023-11-05 LAB — CBC WITH DIFFERENTIAL/PLATELET
Abs Immature Granulocytes: 0.05 10*3/uL (ref 0.00–0.07)
Basophils Absolute: 0 10*3/uL (ref 0.0–0.1)
Basophils Relative: 0 %
Eosinophils Absolute: 0 10*3/uL (ref 0.0–0.5)
Eosinophils Relative: 0 %
HCT: 45.1 % (ref 39.0–52.0)
Hemoglobin: 14.2 g/dL (ref 13.0–17.0)
Immature Granulocytes: 1 %
Lymphocytes Relative: 8 %
Lymphs Abs: 0.7 10*3/uL (ref 0.7–4.0)
MCH: 31 pg (ref 26.0–34.0)
MCHC: 31.5 g/dL (ref 30.0–36.0)
MCV: 98.5 fL (ref 80.0–100.0)
Monocytes Absolute: 0.4 10*3/uL (ref 0.1–1.0)
Monocytes Relative: 5 %
Neutro Abs: 7.6 10*3/uL (ref 1.7–7.7)
Neutrophils Relative %: 86 %
Platelets: 230 10*3/uL (ref 150–400)
RBC: 4.58 MIL/uL (ref 4.22–5.81)
RDW: 14.9 % (ref 11.5–15.5)
WBC: 8.9 10*3/uL (ref 4.0–10.5)
nRBC: 0 % (ref 0.0–0.2)

## 2023-11-05 LAB — URINALYSIS, ROUTINE W REFLEX MICROSCOPIC
Bacteria, UA: NONE SEEN
Bilirubin Urine: NEGATIVE
Glucose, UA: >=500 mg/dL — AB
Hgb urine dipstick: NEGATIVE
Ketones, ur: 5 mg/dL — AB
Leukocytes,Ua: NEGATIVE
Nitrite: NEGATIVE
Protein, ur: NEGATIVE mg/dL
Specific Gravity, Urine: 1.012 (ref 1.005–1.030)
Squamous Epithelial / HPF: 0 /HPF (ref 0–5)
pH: 8 (ref 5.0–8.0)

## 2023-11-05 LAB — COMPREHENSIVE METABOLIC PANEL WITH GFR
ALT: 10 U/L (ref 0–44)
AST: 14 U/L — ABNORMAL LOW (ref 15–41)
Albumin: 3.3 g/dL — ABNORMAL LOW (ref 3.5–5.0)
Alkaline Phosphatase: 51 U/L (ref 38–126)
Anion gap: 14 (ref 5–15)
BUN: 19 mg/dL (ref 8–23)
CO2: 32 mmol/L (ref 22–32)
Calcium: 8.4 mg/dL — ABNORMAL LOW (ref 8.9–10.3)
Chloride: 85 mmol/L — ABNORMAL LOW (ref 98–111)
Creatinine, Ser: 0.89 mg/dL (ref 0.61–1.24)
GFR, Estimated: >60 mL/min (ref >60)
Glucose, Bld: 218 mg/dL — ABNORMAL HIGH (ref 70–99)
Potassium: 4.2 mmol/L (ref 3.5–5.1)
Sodium: 131 mmol/L — ABNORMAL LOW (ref 135–145)
Total Bilirubin: 1.2 mg/dL (ref 0.0–1.2)
Total Protein: 6.2 g/dL — ABNORMAL LOW (ref 6.5–8.1)

## 2023-11-05 MED ORDER — SODIUM CHLORIDE 0.9 % IV SOLN: Freq: Once | INTRAVENOUS | Status: AC

## 2023-11-05 MED ORDER — IOHEXOL 300 MG/ML  SOLN
100.0000 mL | Freq: Once | INTRAMUSCULAR | Status: AC | PRN
  Administered 2023-11-05: 100 mL via INTRAVENOUS

## 2023-11-05 NOTE — ED Notes (Signed)
 Family went home to get pts portable oxygen tank, will discharge once family returns with oxygen.

## 2023-11-05 NOTE — ED Provider Notes (Signed)
 [facility] Provider Note    Event Date/Time   First MD Initiated Contact with Patient 11/05/23 1642     (approximate)   History   Abdominal Pain and Constipation   HPI  [name] is a 87 y.o. male who presents to the emergency department today because of concerns for decreased bowel movements.  Patient states he has not had a bowel movement past 5 days.  He is passing very minimal gas.  He has tried over-the-counter medications.  Prior to this starting he was dealing with diarrhea.  He had taken some antidiarrheal medication.  He does have associated abdominal pain and distention. History of cholecystectomy.      Physical Exam   Triage Vital Signs: ED Triage Vitals  Encounter Vitals Group     BP 11/05/23 1648 139/66     Systolic BP Percentile --      Diastolic BP Percentile --      Pulse Rate 11/05/23 1648 71     Resp 11/05/23 1648 18     Temp 11/05/23 1648 98.1 F (36.7 C)     Temp Source 11/05/23 1648 Oral     SpO2 11/05/23 1648 92 %     Weight 11/05/23 1652 193 lb 12.8 oz (87.9 kg)     Height 11/05/23 1652 5\' 10"  (1.778 m)     Head Circumference --      Peak Flow --      Pain Score 11/05/23 1649 5     Pain Loc --      Pain Education --      Exclude from Growth Chart --     Most recent vital signs: Vitals:   11/05/23 1648  BP: 139/66  Pulse: 71  Resp: 18  Temp: 98.1 F (36.7 C)  SpO2: 92%   General: Awake, alert, oriented. CV:  Good peripheral perfusion. Regular rate and rhythm. Resp:  Normal effort. Lungs clear. Abd:  Minimally distended, diffusely tender to palpation.   ED Results / Procedures / Treatments   Labs (all labs ordered are listed, but only abnormal results are displayed) Labs Reviewed  COMPREHENSIVE METABOLIC PANEL WITH GFR - Abnormal; Notable for the following components:      Result Value   Sodium 131 (*)    Chloride 85 (*)    Glucose, Bld 218 (*)    Calcium 8.4 (*)    Total Protein 6.2 (*)     Albumin 3.3 (*)    AST 14 (*)    All other components within normal limits  URINALYSIS, ROUTINE W REFLEX MICROSCOPIC - Abnormal; Notable for the following components:   Color, Urine STRAW (*)    APPearance CLEAR (*)    Glucose, UA >=500 (*)    Ketones, ur 5 (*)    All other components within normal limits  CBC WITH DIFFERENTIAL/PLATELET     EKG  None   RADIOLOGY I independently interpreted and visualized the CT abd/pel. My interpretation: No free air. No SBO. No large amount of stool. Pleural effusion Radiology interpretation: IMPRESSION:  1. Moderate amount of retained stool in the colon, compatible with  history of constipation.  2. Diverticulosis without diverticulitis.  3. Small left pleural effusion with atelectasis or infiltrate at the  left lung base.  4. Emphysema.  5. Nonobstructive left renal calculus.  6. Stable parapelvic cyst in the right kidney.  7. Aortic atherosclerosis.    PROCEDURES:  Critical Care performed: No    MEDICATIONS ORDERED IN  ED: Medications - No data to display   IMPRESSION / MDM / ASSESSMENT AND PLAN / ED COURSE  I reviewed the triage vital signs and the nursing notes.                              Differential diagnosis includes, but is not limited to, constipation, bowel obstruction  Patient's presentation is most consistent with acute presentation with potential threat to life or bodily function.  Patient presented to the emergency department today because of concerns for constipation.  On exam patient's abdomen is slightly distended and tender.  Does have history of intra-abdominal surgeries.  Did obtain a CT scan to evaluate for obstruction.  This did not show evidence of obstruction but was consistent with constipation.  Also showed a pleural effusion and possible infiltrate.  However patient denies any shortness of breath, fevers.  At this time I have very low concern for pneumonia.  Patient was given enema here in the emergency  department with large amount of stool subsequently excreted.  At this time we will plan on discharging.  Did recommend patient continue MiraLAX for the next few days.      FINAL CLINICAL IMPRESSION(S) / ED DIAGNOSES   Final diagnoses:  Constipation, unspecified constipation type        Rx / DC Orders      Note:  This document was prepared using Dragon voice recognition software and may include unintentional dictation errors.    Marylynn Soho, MD 11/05/23 2101

## 2023-11-05 NOTE — ED Notes (Signed)
 Soap suds enema done, pt on bedpan attempting to have bm

## 2023-11-05 NOTE — ED Notes (Signed)
 Patient transported to CT

## 2023-11-05 NOTE — ED Notes (Signed)
 Pt had successful bm. Provider notified

## 2023-11-05 NOTE — ED Triage Notes (Signed)
 Pt to ED via [facility] from home for c/o abdominal pain and constipation. Pt states no bowel movement for 5 days despite using otc remedies.  Pt has hx COPD, CHF, DM, HTN

## 2024-03-21 ENCOUNTER — Inpatient Hospital Stay
Admission: EM | Admit: 2024-03-21 | Discharge: 2024-03-23 | DRG: 189 | Disposition: A | Discharge status: Home Health | Attending: Student in an Organized Health Care Education/Training Program | Admitting: Student in an Organized Health Care Education/Training Program

## 2024-03-21 ENCOUNTER — Emergency Department

## 2024-03-21 DIAGNOSIS — J189 Pneumonia, unspecified organism: Secondary | ICD-10-CM | POA: Diagnosis present

## 2024-03-21 DIAGNOSIS — E7800 Pure hypercholesterolemia, unspecified: Secondary | ICD-10-CM | POA: Diagnosis present

## 2024-03-21 DIAGNOSIS — Z1152 Encounter for screening for COVID-19: Secondary | ICD-10-CM | POA: Diagnosis not present

## 2024-03-21 DIAGNOSIS — K219 Gastro-esophageal reflux disease without esophagitis: Secondary | ICD-10-CM | POA: Diagnosis present

## 2024-03-21 DIAGNOSIS — I5033 Acute on chronic diastolic (congestive) heart failure: Secondary | ICD-10-CM | POA: Diagnosis present

## 2024-03-21 DIAGNOSIS — G4730 Sleep apnea, unspecified: Secondary | ICD-10-CM | POA: Diagnosis present

## 2024-03-21 DIAGNOSIS — E1165 Type 2 diabetes mellitus with hyperglycemia: Secondary | ICD-10-CM | POA: Diagnosis present

## 2024-03-21 DIAGNOSIS — J9602 Acute respiratory failure with hypercapnia: Secondary | ICD-10-CM

## 2024-03-21 DIAGNOSIS — I272 Pulmonary hypertension, unspecified: Secondary | ICD-10-CM | POA: Diagnosis present

## 2024-03-21 DIAGNOSIS — I110 Hypertensive heart disease with heart failure: Secondary | ICD-10-CM | POA: Diagnosis present

## 2024-03-21 DIAGNOSIS — M1990 Unspecified osteoarthritis, unspecified site: Secondary | ICD-10-CM | POA: Diagnosis present

## 2024-03-21 DIAGNOSIS — Z794 Long term (current) use of insulin: Secondary | ICD-10-CM | POA: Diagnosis not present

## 2024-03-21 DIAGNOSIS — Z23 Encounter for immunization: Secondary | ICD-10-CM

## 2024-03-21 DIAGNOSIS — Z9981 Dependence on supplemental oxygen: Secondary | ICD-10-CM

## 2024-03-21 DIAGNOSIS — J9621 Acute and chronic respiratory failure with hypoxia: Secondary | ICD-10-CM | POA: Diagnosis present

## 2024-03-21 DIAGNOSIS — E1151 Type 2 diabetes mellitus with diabetic peripheral angiopathy without gangrene: Secondary | ICD-10-CM | POA: Diagnosis present

## 2024-03-21 DIAGNOSIS — J45901 Unspecified asthma with (acute) exacerbation: Secondary | ICD-10-CM | POA: Diagnosis present

## 2024-03-21 DIAGNOSIS — L89153 Pressure ulcer of sacral region, stage 3: Secondary | ICD-10-CM | POA: Diagnosis present

## 2024-03-21 DIAGNOSIS — Z87891 Personal history of nicotine dependence: Secondary | ICD-10-CM

## 2024-03-21 DIAGNOSIS — Z833 Family history of diabetes mellitus: Secondary | ICD-10-CM | POA: Diagnosis not present

## 2024-03-21 DIAGNOSIS — E039 Hypothyroidism, unspecified: Secondary | ICD-10-CM | POA: Diagnosis present

## 2024-03-21 DIAGNOSIS — Z7952 Long term (current) use of systemic steroids: Secondary | ICD-10-CM

## 2024-03-21 DIAGNOSIS — J9601 Acute respiratory failure with hypoxia: Secondary | ICD-10-CM | POA: Diagnosis not present

## 2024-03-21 DIAGNOSIS — J440 Chronic obstructive pulmonary disease with acute lower respiratory infection: Secondary | ICD-10-CM | POA: Diagnosis present

## 2024-03-21 DIAGNOSIS — R0602 Shortness of breath: Secondary | ICD-10-CM | POA: Diagnosis present

## 2024-03-21 DIAGNOSIS — Z66 Do not resuscitate: Secondary | ICD-10-CM | POA: Diagnosis present

## 2024-03-21 DIAGNOSIS — Z881 Allergy status to other antibiotic agents status: Secondary | ICD-10-CM

## 2024-03-21 DIAGNOSIS — J9622 Acute and chronic respiratory failure with hypercapnia: Secondary | ICD-10-CM | POA: Diagnosis present

## 2024-03-21 DIAGNOSIS — E1140 Type 2 diabetes mellitus with diabetic neuropathy, unspecified: Secondary | ICD-10-CM | POA: Diagnosis present

## 2024-03-21 DIAGNOSIS — I509 Heart failure, unspecified: Secondary | ICD-10-CM

## 2024-03-21 DIAGNOSIS — Z886 Allergy status to analgesic agent status: Secondary | ICD-10-CM

## 2024-03-21 DIAGNOSIS — Z7982 Long term (current) use of aspirin: Secondary | ICD-10-CM

## 2024-03-21 DIAGNOSIS — J9600 Acute respiratory failure, unspecified whether with hypoxia or hypercapnia: Secondary | ICD-10-CM | POA: Diagnosis present

## 2024-03-21 DIAGNOSIS — L89323 Pressure ulcer of left buttock, stage 3: Secondary | ICD-10-CM | POA: Diagnosis present

## 2024-03-21 DIAGNOSIS — Z7984 Long term (current) use of oral hypoglycemic drugs: Secondary | ICD-10-CM | POA: Diagnosis not present

## 2024-03-21 DIAGNOSIS — Z79899 Other long term (current) drug therapy: Secondary | ICD-10-CM

## 2024-03-21 DIAGNOSIS — L8990 Pressure ulcer of unspecified site, unspecified stage: Secondary | ICD-10-CM | POA: Insufficient documentation

## 2024-03-21 DIAGNOSIS — Z79890 Hormone replacement therapy (postmenopausal): Secondary | ICD-10-CM

## 2024-03-21 DIAGNOSIS — J441 Chronic obstructive pulmonary disease with (acute) exacerbation: Secondary | ICD-10-CM | POA: Diagnosis present

## 2024-03-21 DIAGNOSIS — Z7951 Long term (current) use of inhaled steroids: Secondary | ICD-10-CM

## 2024-03-21 DIAGNOSIS — E785 Hyperlipidemia, unspecified: Secondary | ICD-10-CM | POA: Insufficient documentation

## 2024-03-21 LAB — RESP PANEL BY RT-PCR (RSV, FLU A&B, COVID)  RVPGX2
Influenza A by PCR: NEGATIVE
Influenza B by PCR: NEGATIVE
Resp Syncytial Virus by PCR: NEGATIVE
SARS Coronavirus 2 by RT PCR: NEGATIVE

## 2024-03-21 LAB — CBC WITH DIFFERENTIAL/PLATELET
Abs Immature Granulocytes: 0.05 K/uL (ref 0.00–0.07)
Basophils Absolute: 0 K/uL (ref 0.0–0.1)
Basophils Relative: 0 %
Eosinophils Absolute: 0 K/uL (ref 0.0–0.5)
Eosinophils Relative: 0 %
HCT: 39.7 % (ref 39.0–52.0)
Hemoglobin: 12.1 g/dL — ABNORMAL LOW (ref 13.0–17.0)
Immature Granulocytes: 1 %
Lymphocytes Relative: 21 %
Lymphs Abs: 1.6 K/uL (ref 0.7–4.0)
MCH: 30.9 pg (ref 26.0–34.0)
MCHC: 30.5 g/dL (ref 30.0–36.0)
MCV: 101.3 fL — ABNORMAL HIGH (ref 80.0–100.0)
Monocytes Absolute: 0.6 K/uL (ref 0.1–1.0)
Monocytes Relative: 7 %
Neutro Abs: 5.5 K/uL (ref 1.7–7.7)
Neutrophils Relative %: 71 %
Platelets: 205 K/uL (ref 150–400)
RBC: 3.92 MIL/uL — ABNORMAL LOW (ref 4.22–5.81)
RDW: 14.8 % (ref 11.5–15.5)
WBC: 7.7 K/uL (ref 4.0–10.5)
nRBC: 0 % (ref 0.0–0.2)

## 2024-03-21 LAB — COMPREHENSIVE METABOLIC PANEL WITH GFR
ALT: 8 U/L (ref 0–44)
AST: 13 U/L — ABNORMAL LOW (ref 15–41)
Albumin: 3 g/dL — ABNORMAL LOW (ref 3.5–5.0)
Alkaline Phosphatase: 59 U/L (ref 38–126)
Anion gap: 15 (ref 5–15)
BUN: 17 mg/dL (ref 8–23)
CO2: 36 mmol/L — ABNORMAL HIGH (ref 22–32)
Calcium: 8.5 mg/dL — ABNORMAL LOW (ref 8.9–10.3)
Chloride: 83 mmol/L — ABNORMAL LOW (ref 98–111)
Creatinine, Ser: 0.71 mg/dL (ref 0.61–1.24)
GFR, Estimated: >60 mL/min (ref >60)
Glucose, Bld: 368 mg/dL — ABNORMAL HIGH (ref 70–99)
Potassium: 4.4 mmol/L (ref 3.5–5.1)
Sodium: 134 mmol/L — ABNORMAL LOW (ref 135–145)
Total Bilirubin: 0.8 mg/dL (ref 0.0–1.2)
Total Protein: 6.6 g/dL (ref 6.5–8.1)

## 2024-03-21 LAB — BLOOD GAS, VENOUS
Acid-Base Excess: 18.5 mmol/L — ABNORMAL HIGH (ref 0.0–2.0)
Bicarbonate: 47.7 mmol/L — ABNORMAL HIGH (ref 20.0–28.0)
O2 Saturation: 70.9 %
Patient temperature: 37
pCO2, Ven: 77 mmHg (ref 44–60)
pH, Ven: 7.4 (ref 7.25–7.43)
pO2, Ven: 39 mmHg (ref 32–45)

## 2024-03-21 LAB — BRAIN NATRIURETIC PEPTIDE: B Natriuretic Peptide: 413.3 pg/mL — ABNORMAL HIGH (ref 0.0–100.0)

## 2024-03-21 LAB — TROPONIN I (HIGH SENSITIVITY): Troponin I (High Sensitivity): 10 ng/L (ref <18)

## 2024-03-21 MED ORDER — LEVOTHYROXINE SODIUM 137 MCG PO TABS
137.0000 ug | ORAL_TABLET | Freq: Every day | ORAL | Status: AC
Start: 2024-03-22 — End: ?
  Administered 2024-03-22 – 2024-03-23 (×2): 137 ug via ORAL
  Filled 2024-03-21 (×2): qty 1

## 2024-03-21 MED ORDER — LIOTHYRONINE SODIUM 5 MCG PO TABS
5.0000 ug | ORAL_TABLET | Freq: Every day | ORAL | Status: DC
  Administered 2024-03-22 – 2024-03-23 (×2): 5 ug via ORAL
  Filled 2024-03-21 (×2): qty 1

## 2024-03-21 MED ORDER — IPRATROPIUM-ALBUTEROL 0.5-2.5 (3) MG/3ML IN SOLN
3.0000 mL | Freq: Once | RESPIRATORY_TRACT | Status: AC
  Administered 2024-03-21: 3 mL via RESPIRATORY_TRACT
  Filled 2024-03-21: qty 3

## 2024-03-21 MED ORDER — REVEFENACIN 175 MCG/3ML IN SOLN: 175.0000 ug | Freq: Every day | RESPIRATORY_TRACT | Status: DC

## 2024-03-21 MED ORDER — BRIMONIDINE TARTRATE 0.2 % OP SOLN: 1.0000 [drp] | Freq: Two times a day (BID) | OPHTHALMIC | Status: DC

## 2024-03-21 MED ORDER — GLIPIZIDE 5 MG PO TABS: 5.0000 mg | ORAL_TABLET | Freq: Every day | ORAL | Status: DC

## 2024-03-21 MED ORDER — PRAVASTATIN SODIUM 20 MG PO TABS
20.0000 mg | ORAL_TABLET | Freq: Every day | ORAL | Status: DC
  Administered 2024-03-22: 20 mg via ORAL
  Filled 2024-03-21: qty 1

## 2024-03-21 MED ORDER — ASPIRIN 81 MG PO TBEC
81.0000 mg | DELAYED_RELEASE_TABLET | Freq: Every day | ORAL | Status: DC
  Administered 2024-03-22 – 2024-03-23 (×2): 81 mg via ORAL
  Filled 2024-03-21 (×2): qty 1

## 2024-03-21 MED ORDER — QUINAPRIL HCL 10 MG PO TABS: 20.0000 mg | ORAL_TABLET | Freq: Every day | ORAL | Status: DC

## 2024-03-21 MED ORDER — EMPAGLIFLOZIN 10 MG PO TABS: 10.0000 mg | ORAL_TABLET | Freq: Every day | ORAL | Status: DC

## 2024-03-21 MED ORDER — GUAIFENESIN ER 600 MG PO TB12
600.0000 mg | ORAL_TABLET | Freq: Two times a day (BID) | ORAL | Status: DC
  Administered 2024-03-22 – 2024-03-23 (×3): 600 mg via ORAL
  Filled 2024-03-21 (×3): qty 1

## 2024-03-21 MED ORDER — ROFLUMILAST 500 MCG PO TABS
250.0000 ug | ORAL_TABLET | Freq: Every day | ORAL | Status: AC
Start: 2024-03-22 — End: ?
  Administered 2024-03-22 – 2024-03-23 (×2): 250 ug via ORAL
  Filled 2024-03-21 (×2): qty 1

## 2024-03-21 MED ORDER — HYDROCOD POLI-CHLORPHE POLI ER 10-8 MG/5ML PO SUER: 5.0000 mL | Freq: Two times a day (BID) | ORAL | Status: DC | PRN

## 2024-03-21 MED ORDER — GABAPENTIN 400 MG PO CAPS
800.0000 mg | ORAL_CAPSULE | Freq: Three times a day (TID) | ORAL | Status: DC
  Administered 2024-03-22 – 2024-03-23 (×5): 800 mg via ORAL
  Filled 2024-03-21 (×4): qty 2
  Filled 2024-03-21: qty 8
  Filled 2024-03-21: qty 2
  Filled 2024-03-21: qty 8
  Filled 2024-03-21: qty 2

## 2024-03-21 MED ORDER — MONTELUKAST SODIUM 10 MG PO TABS
10.0000 mg | ORAL_TABLET | Freq: Every day | ORAL | Status: DC
  Administered 2024-03-22: 10 mg via ORAL
  Filled 2024-03-21: qty 1

## 2024-03-21 MED ORDER — MUPIROCIN 2 % EX OINT
1.0000 | TOPICAL_OINTMENT | Freq: Two times a day (BID) | CUTANEOUS | Status: DC
  Administered 2024-03-22 – 2024-03-23 (×3): 1 via TOPICAL
  Filled 2024-03-21 (×2): qty 22

## 2024-03-21 MED ORDER — FUROSEMIDE 10 MG/ML IJ SOLN
40.0000 mg | Freq: Once | INTRAMUSCULAR | Status: AC
  Administered 2024-03-21: 40 mg via INTRAVENOUS
  Filled 2024-03-21: qty 4

## 2024-03-21 MED ORDER — ALBUTEROL SULFATE (2.5 MG/3ML) 0.083% IN NEBU
2.5000 mg | INHALATION_SOLUTION | Freq: Once | RESPIRATORY_TRACT | Status: AC
  Administered 2024-03-21: 2.5 mg via RESPIRATORY_TRACT
  Filled 2024-03-21: qty 3

## 2024-03-21 MED ORDER — MAGNESIUM OXIDE 400 MG PO TABS
400.0000 mg | ORAL_TABLET | Freq: Two times a day (BID) | ORAL | Status: DC
  Administered 2024-03-22 – 2024-03-23 (×3): 400 mg via ORAL
  Filled 2024-03-21 (×6): qty 1

## 2024-03-21 MED ORDER — CITALOPRAM HYDROBROMIDE 20 MG PO TABS: 40.0000 mg | ORAL_TABLET | Freq: Every day | ORAL | Status: DC

## 2024-03-21 MED ORDER — VITAMIN D 25 MCG (1000 UNIT) PO TABS
1000.0000 [IU] | ORAL_TABLET | Freq: Every day | ORAL | Status: AC
Start: 2024-03-22 — End: ?
  Administered 2024-03-22: 1000 [IU] via ORAL
  Filled 2024-03-21: qty 1

## 2024-03-21 MED ORDER — INSULIN GLARGINE 100 UNIT/ML ~~LOC~~ SOLN
15.0000 [IU] | Freq: Every day | SUBCUTANEOUS | Status: DC
  Administered 2024-03-22 – 2024-03-23 (×2): 15 [IU] via SUBCUTANEOUS
  Filled 2024-03-21 (×2): qty 0.15

## 2024-03-21 MED ORDER — LATANOPROST 0.005 % OP SOLN
1.0000 [drp] | Freq: Every day | OPHTHALMIC | Status: DC
  Administered 2024-03-22: 1 [drp] via OPHTHALMIC
  Filled 2024-03-21 (×2): qty 2.5

## 2024-03-21 MED ORDER — TRAZODONE HCL 50 MG PO TABS
50.0000 mg | ORAL_TABLET | Freq: Every evening | ORAL | Status: AC | PRN
Start: 2024-03-21 — End: ?
  Administered 2024-03-22: 50 mg via ORAL
  Filled 2024-03-21: qty 1

## 2024-03-21 MED ORDER — LORATADINE 10 MG PO TABS: 10.0000 mg | ORAL_TABLET | Freq: Every day | ORAL | Status: DC | PRN

## 2024-03-21 MED ORDER — LATANOPROST 0.005 % OP SOLN: 1.0000 [drp] | Freq: Every day | OPHTHALMIC | Status: DC

## 2024-03-21 MED ORDER — POTASSIUM CHLORIDE CRYS ER 20 MEQ PO TBCR
20.0000 meq | EXTENDED_RELEASE_TABLET | Freq: Every day | ORAL | Status: DC
  Administered 2024-03-22 – 2024-03-23 (×2): 20 meq via ORAL
  Filled 2024-03-21 (×2): qty 1

## 2024-03-21 MED ORDER — EMPAGLIFLOZIN 25 MG PO TABS
25.0000 mg | ORAL_TABLET | Freq: Every day | ORAL | Status: DC
  Administered 2024-03-22 – 2024-03-23 (×2): 25 mg via ORAL
  Filled 2024-03-21 (×2): qty 1

## 2024-03-21 MED ORDER — ROPINIROLE HCL 1 MG PO TABS
1.0000 mg | ORAL_TABLET | Freq: Every day | ORAL | Status: DC
  Administered 2024-03-22: 1 mg via ORAL
  Filled 2024-03-21 (×2): qty 1

## 2024-03-21 MED ORDER — GLIPIZIDE 10 MG PO TABS
10.0000 mg | ORAL_TABLET | Freq: Every day | ORAL | Status: DC
  Administered 2024-03-22 – 2024-03-23 (×2): 10 mg via ORAL
  Filled 2024-03-21 (×4): qty 1

## 2024-03-21 MED ORDER — NETARSUDIL DIMESYLATE 0.02 % OP SOLN
1.0000 [drp] | Freq: Every day | OPHTHALMIC | Status: AC
Start: 2024-03-22 — End: ?

## 2024-03-21 MED ORDER — FUROSEMIDE 10 MG/ML IJ SOLN
40.0000 mg | Freq: Two times a day (BID) | INTRAMUSCULAR | Status: DC
  Administered 2024-03-22 – 2024-03-23 (×3): 40 mg via INTRAVENOUS
  Filled 2024-03-21 (×3): qty 4

## 2024-03-21 MED ORDER — LEVOFLOXACIN IN D5W 750 MG/150ML IV SOLN
750.0000 mg | INTRAVENOUS | Status: DC
  Administered 2024-03-22: 750 mg via INTRAVENOUS
  Filled 2024-03-21: qty 150

## 2024-03-21 MED ORDER — CARVEDILOL 25 MG PO TABS
25.0000 mg | ORAL_TABLET | Freq: Two times a day (BID) | ORAL | Status: AC
Start: 2024-03-22 — End: ?
  Administered 2024-03-22 – 2024-03-23 (×3): 25 mg via ORAL
  Filled 2024-03-21 (×3): qty 1

## 2024-03-21 NOTE — ED Provider Notes (Signed)
 [facility] Provider Note    Event Date/Time   First MD Initiated Contact with Patient 03/21/24 2119     (approximate)   History   Shortness of Breath  Pt to ED BIB [facility] with c/o shortness of breath. Pt with hx of COPD, on 3L [place] at baseline, found with O2 sat 80% with significant work of breathing. Received 2g mag, 125mg  solumedrol and 2 duo neb treatments. Pt arrives satting 94% on 4L but then decompensated to 86%. Placed on BiPap,    HPI [name] is a 88 y.o. male COPD on home nasal cannula, CHF, Graves' disease, hypertension, hyperlipidemia, asthma, T2DM presents for evaluation of shortness of breath -Per EMS, called for shortness of breath.  Normally on 3-3.5 L nasal cannula.  Received 2 rounds of DuoNeb, 125 mg IV methylprednisolone , 2 g magnesium . - On my evaluation, patient confirms that he has been feeling very short of breath.  Says this does feel like his COPD.  Limited historian due to respiratory distress        Physical Exam   Triage Vital Signs: ED Triage Vitals [03/21/24 2114]  Encounter Vitals Group     BP      Girls Systolic BP Percentile      Girls Diastolic BP Percentile      Boys Systolic BP Percentile      Boys Diastolic BP Percentile      Pulse      Resp      Temp      Temp src      SpO2 94 %     Weight      Height      Head Circumference      Peak Flow      Pain Score      Pain Loc      Pain Education      Exclude from Growth Chart     Most recent vital signs: Vitals:   03/21/24 2200 03/21/24 2230  BP: (!) 147/71 128/68  Pulse: 74 68  Resp: 14 19  Temp:    SpO2: 99% 100%     General: Awake, +resp distress CV:  Good peripheral perfusion. RRR, RP 2+ Resp:  Tachypneic, 3 word sentences, belly breathing, markedly diminished breath sounds bilaterally with some end expiratory wheezing present throughout Abd:  No distention. Nontender to deep palpation throughout Other:  No significant lower  extremity edema appreciated   ED Results / Procedures / Treatments   Labs (all labs ordered are listed, but only abnormal results are displayed) Labs Reviewed  CBC WITH DIFFERENTIAL/PLATELET - Abnormal; Notable for the following components:      Result Value   RBC 3.92 (*)    Hemoglobin 12.1 (*)    MCV 101.3 (*)    All other components within normal limits  COMPREHENSIVE METABOLIC PANEL WITH GFR - Abnormal; Notable for the following components:   Sodium 134 (*)    Chloride 83 (*)    CO2 36 (*)    Glucose, Bld 368 (*)    Calcium 8.5 (*)    Albumin 3.0 (*)    AST 13 (*)    All other components within normal limits  BRAIN NATRIURETIC PEPTIDE - Abnormal; Notable for the following components:   B Natriuretic Peptide 413.3 (*)    All other components within normal limits  BLOOD GAS, VENOUS - Abnormal; Notable for the following components:   pCO2, Ven 77 (*)  Bicarbonate 47.7 (*)    Acid-Base Excess 18.5 (*)    All other components within normal limits  RESP PANEL BY RT-PCR (RSV, FLU A&B, COVID)  [id number]  TROPONIN I (HIGH SENSITIVITY)  TROPONIN I (HIGH SENSITIVITY)     EKG  See ED course below.   RADIOLOGY  Radiology interpreted by myself and radiology report reviewed.  Pulmonary edema.  Possibility of pneumonia.   PROCEDURES:  Critical Care performed: Yes, see critical care procedure note(s)  .Critical Care  Performed by: Clarine Ozell LABOR, MD Authorized by: Clarine Ozell LABOR, MD   Critical care provider statement:    Critical care time (minutes):  30   Critical care time was exclusive of:  Separately billable procedures and treating other patients   Critical care was necessary to treat or prevent imminent or life-threatening deterioration of the following conditions:  Respiratory failure   Critical care was time spent personally by me on the following activities:  Development of treatment plan with patient or surrogate, discussions with consultants, evaluation of  patient's response to treatment, examination of patient, ordering and review of laboratory studies, ordering and review of radiographic studies, ordering and performing treatments and interventions, pulse oximetry, re-evaluation of patient's condition and review of old charts   I assumed direction of critical care for this patient from another provider in my specialty: no     Care discussed with: admitting provider      MEDICATIONS ORDERED IN ED: Medications  aspirin  EC tablet 81 mg (has no administration in time range)  carvedilol  (COREG ) tablet 25 mg (has no administration in time range)  pravastatin  (PRAVACHOL ) tablet 20 mg (has no administration in time range)  quinapril (ACCUPRIL) tablet 20 mg (has no administration in time range)  citalopram  (CELEXA ) tablet 40 mg (has no administration in time range)  glipiZIDE (GLUCOTROL) tablet 10 mg (has no administration in time range)  traZODone (DESYREL) tablet 50 mg (has no administration in time range)  glipiZIDE (GLUCOTROL) tablet 5 mg (has no administration in time range)  empagliflozin  (JARDIANCE ) tablet 10 mg (has no administration in time range)  empagliflozin  (JARDIANCE ) tablet 25 mg (has no administration in time range)  levothyroxine  (SYNTHROID ) tablet 137 mcg (has no administration in time range)  liothyronine  (CYTOMEL ) tablet 5 mcg (has no administration in time range)  insulin  glargine (1 Unit Dial) (TOUJEO ) Solostar Pen SOPN 15 Units (has no administration in time range)  magnesium  oxide (MAG-OX) tablet 400 mg (has no administration in time range)  gabapentin  (NEURONTIN ) tablet 800 mg (has no administration in time range)  rOPINIRole (REQUIP) tablet 1 mg (has no administration in time range)  cholecalciferol (VITAMIN D3) 25 MCG (1000 UNIT) tablet 1,000 Units (has no administration in time range)  Potassium Chloride ER TBCR 20 mEq (has no administration in time range)  loratadine  (CLARITIN ) tablet 10 mg (has no administration in  time range)  montelukast  (SINGULAIR ) tablet 10 mg (has no administration in time range)  Roflumilast  TABS 1 tablet (has no administration in time range)  revefenacin  (YUPELRI ) nebulizer solution 175 mcg (has no administration in time range)  brimonidine  (ALPHAGAN ) 0.2 % ophthalmic solution 1 drop (has no administration in time range)  latanoprost  (XALATAN ) 0.005 % ophthalmic solution 1 drop (has no administration in time range)  latanoprost  (XALATAN ) 0.005 % ophthalmic solution 1 drop (has no administration in time range)  mupirocin ointment (BACTROBAN) 2 % 1 Application (has no administration in time range)  Netarsudil Dimesylate 0.02 % SOLN 1 drop (has no administration  in time range)  levofloxacin  (LEVAQUIN ) IVPB 750 mg (has no administration in time range)  guaiFENesin  (MUCINEX ) 12 hr tablet 600 mg (has no administration in time range)  chlorpheniramine-HYDROcodone (TUSSIONEX) 10-8 MG/5ML suspension 5 mL (has no administration in time range)  furosemide  (LASIX ) injection 40 mg (has no administration in time range)  ipratropium-albuterol  (DUONEB) 0.5-2.5 (3) MG/3ML nebulizer solution 3 mL (3 mLs Nebulization Given 03/21/24 2126)  albuterol  (PROVENTIL ) (2.5 MG/3ML) 0.083% nebulizer solution 2.5 mg (2.5 mg Nebulization Given 03/21/24 2126)  albuterol  (PROVENTIL ) (2.5 MG/3ML) 0.083% nebulizer solution 2.5 mg (2.5 mg Nebulization Given 03/21/24 2126)  furosemide  (LASIX ) injection 40 mg (40 mg Intravenous Given 03/21/24 2246)     IMPRESSION / MDM / ASSESSMENT AND PLAN / ED COURSE  I reviewed the triage vital signs and the nursing notes.                              DDX/MDM/AP: Differential diagnosis includes, but is not limited to, asthma/COPD exacerbation, consider underlying pneumonia, viral syndrome including influenza or COVID-19, CHF, ACS.  Clinically doubt PE given exam most consistent with COPD exacerbation.  Plan: - BiPAP - DuoNeb and further albuterol  - Already status post  steroids and magnesium  - EKG Labs Chest x-ray - Reassess, anticipate admission  Patient's presentation is most consistent with acute presentation with potential threat to life or bodily function.  The patient is on the cardiac monitor to evaluate for evidence of arrhythmia and/or significant heart rate changes.  ED course below.  Workup consistent with COPD/asthma exacerbation as well as CHF exacerbation.  Continuing BiPAP and nebs, also diuresed.  Chest x-ray with possibility of pneumonia, however no white count or fever and patient denies any recent cough--antibiotics deferred.  Admitted to hospitalist service.  Doing well on BiPAP.  Clinical Course as of 03/22/24 0010  Wed Mar 21, 2024  2126 Ecg = sinus rhythm, rate 74, no gross ST elevation or depression, no significant repolarization normality, normal axis, normal intervals.  Evidence of LVH.  No clear evidence of ischemia no arrhythmia on my interpretation. [MM]  2226 CMP with hyperglycemia but not consistent with DKA.  Otherwise elevated bicarb consistent with likely baseline hypercarbia. [MM]  2227 CBC with no leukocytosis, mild anemia within baseline range [MM]  2227 Troponin normal BNP elevated compared to prior [MM]  2227 VBG with hypercarbia to 77, pH normal [MM]  2227 Viral swab negative [MM]  2228 CXR: IMPRESSION: 1. Small bilateral pleural effusions 2. Left basilar pneumonia 3. Cardiomegaly 4. Pulmonary edema pattern   [MM]  2234 Patient reevaluated, much better on BiPAP, saturations notably improved  No significant cough, no fever recently  Amenable to admission, hospitalist consult order placed [MM]    Clinical Course User Index [MM] Clarine Ozell LABOR, MD     FINAL CLINICAL IMPRESSION(S) / ED DIAGNOSES   Final diagnoses:  COPD exacerbation (HCC)  Acute on chronic respiratory failure with hypoxia and hypercapnia (HCC)  Acute on chronic congestive heart failure, unspecified heart failure type (HCC)     Rx  / DC Orders   ED Discharge Orders     None        Note:  This document was prepared using Dragon voice recognition software and may include unintentional dictation errors.   Clarine Ozell LABOR, MD 03/22/24 LARI

## 2024-03-21 NOTE — ED Triage Notes (Signed)
 Pt to ED BIB [facility] with c/o shortness of breath. Pt with hx of COPD, on 3L [place] at baseline, found with O2 sat 80% with significant work of breathing. Received 2g mag, 125mg  solumedrol and 2 duo neb treatments. Pt arrives satting 94% on 4L but then decompensated to 86%. Placed on BiPap,

## 2024-03-21 NOTE — H&P (Incomplete)
 [name]   PATIENT NAME: [name]    MR#:  [id number]  DATE OF BIRTH:  1936-01-09  DATE OF ADMISSION:  03/21/2024  PRIMARY CARE PHYSICIAN: Ileen Rosaline NOVAK, NP   Patient is coming from: ***  REQUESTING/REFERRING PHYSICIAN: ***  CHIEF COMPLAINT:   Chief Complaint  Patient presents with   Shortness of Breath    HISTORY OF PRESENT ILLNESS:  [name] is a 88 y.o. male with medical history significant for ***  ED Course: *** EKG as reviewed by me : *** Imaging: *** PAST MEDICAL HISTORY:   Past Medical History:  Diagnosis Date   Arthritis    Asthma    CHF (congestive heart failure) (HCC)    COPD (chronic obstructive pulmonary disease) (HCC)    Diabetes mellitus without complication (HCC)    GERD (gastroesophageal reflux disease)    H/O Graves' disease    H/O wheezing    HOH (hard of hearing)    Hypercholesterolemia    Hypertension    Hypothyroidism    Lower extremity edema    Multiple allergies    Palpitations    Peripheral vascular disease    swelling of feet and legs   Pneumonia    in the past   PONV (postoperative nausea and vomiting)    Shortness of breath dyspnea    Sleep apnea    CPAP    PAST SURGICAL HISTORY:   Past Surgical History:  Procedure Laterality Date   CARDIAC CATHETERIZATION     CATARACT EXTRACTION W/PHACO Right 09/01/2015   Procedure: CATARACT EXTRACTION PHACO AND INTRAOCULAR LENS PLACEMENT (IOC);  Surgeon: Steven Dingeldein, MD;  Location: [facility] ORS;  Service: Ophthalmology;  Laterality: Right;  US  01:33 AP% 24.9 CDE 42.99 fluid pack lot # [id number]   CATARACT EXTRACTION W/PHACO Left 09/22/2015   Procedure: CATARACT EXTRACTION PHACO AND INTRAOCULAR LENS PLACEMENT (IOC);  Surgeon: Steven Dingeldein, MD;  Location: [facility] ORS;  Service: Ophthalmology;  Laterality: Left;  US  01:28 AP% 20.9 CDE 29.54 fluid pack lot # [id number]   CHOLECYSTECTOMY     EYE SURGERY     Orbital Decompression     TONSILLECTOMY       SOCIAL HISTORY:   Social History   Tobacco Use   Smoking status: Former   Smokeless tobacco: Never  Substance Use Topics   Alcohol use: Yes    Comment: couple glasses of wine daily    FAMILY HISTORY:   Family History  Problem Relation Age of Onset   Diabetes Father     DRUG ALLERGIES:   Allergies  Allergen Reactions   Lodine [Etodolac] Itching   Cefaclor Rash    REVIEW OF SYSTEMS:   ROS As per history of present illness. All pertinent systems were reviewed above. Constitutional, HEENT, cardiovascular, respiratory, GI, GU, musculoskeletal, neuro, psychiatric, endocrine, integumentary and hematologic systems were reviewed and are otherwise negative/unremarkable except for positive findings mentioned above in the HPI.   MEDICATIONS AT HOME:   Prior to Admission medications   Medication Sig Start Date End Date Taking? Authorizing Provider  carvedilol  (COREG ) 25 MG tablet Take 25 mg by mouth 2 (two) times daily with a meal.   Yes [provider]  gabapentin  (NEURONTIN ) 800 MG tablet Take 800 mg by mouth every 8 (eight) hours.   Yes [provider]  glipiZIDE (GLUCOTROL) 10 MG tablet Take 10 mg by mouth every morning. 03/06/24  Yes [provider]  JARDIANCE  25 MG TABS tablet Take 25  mg by mouth daily.   Yes [provider]  levothyroxine  (SYNTHROID , LEVOTHROID) 137 MCG tablet Take 137 mcg by mouth daily before breakfast.   Yes [provider]  liothyronine  (CYTOMEL ) 5 MCG tablet Take 5 mcg by mouth daily. 04/16/22  Yes [provider]  montelukast  (SINGULAIR ) 10 MG tablet Take 10 mg by mouth daily.   Yes [provider]  mupirocin ointment (BACTROBAN) 2 % Apply 1 Application topically 2 (two) times daily. 01/25/24  Yes [provider]  predniSONE  (DELTASONE ) 20 MG tablet Take 20 mg by mouth daily with breakfast.   Yes [provider]  Roflumilast  250 MCG TABS TAKE 1 TABLET BY MOUTH EVERY DAY  11/03/22  Yes Abernathy, Alyssa, NP  rOPINIRole (REQUIP) 1 MG tablet Take 1 mg by mouth at bedtime. 03/05/24  Yes [provider]  traZODone (DESYREL) 50 MG tablet Take 50 mg by mouth at bedtime as needed. 03/09/24  Yes [provider]  TRELEGY ELLIPTA  200-62.5-25 MCG/ACT AEPB Take 1 puff by mouth daily. 01/04/24  Yes [provider]  albuterol  (VENTOLIN  HFA) 108 (90 Base) MCG/ACT inhaler Inhale 2 puffs into the lungs every 6 (six) hours as needed for wheezing or shortness of breath. 12/30/21   Liana Fish, NP  aspirin  EC 81 MG tablet Take 81 mg by mouth daily.    [provider]  azithromycin  (ZITHROMAX ) 250 MG tablet Take 1 tablet (250 mg total) by mouth daily. 05/24/22   Danford, Lonni SQUIBB, MD  B-D ULTRAFINE III SHORT PEN 31G X 8 MM MISC Inject into the skin daily. 03/25/23   [provider]  brimonidine  (ALPHAGAN ) 0.2 % ophthalmic solution Place 1 drop into the right eye 2 (two) times daily. 05/10/22   [provider]  cholecalciferol (VITAMIN D) 1000 units tablet Take 1,000 Units by mouth at bedtime.     [provider]  citalopram  (CELEXA ) 40 MG tablet Take 40 mg by mouth daily. 05/04/22   [provider]  fexofenadine (ALLEGRA) 180 MG tablet Take 180 mg by mouth daily as needed for allergies.    [provider]  Fluticasone -Umeclidin-Vilant (TRELEGY ELLIPTA ) 100-62.5-25 MCG/ACT AEPB INHALE 1 PUFF INTO THE LUNGS DAILY 11/09/22   Liana Fish, NP  furosemide  (LASIX ) 20 MG tablet Take 20 mg by mouth 2 (two) times daily.    [provider]  ipratropium-albuterol  (DUONEB) 0.5-2.5 (3) MG/3ML SOLN Take 3 mLs by nebulization every 4 (four) hours as needed. [id number] 04/15/21   Abernathy, Fish, NP  LUMIGAN 0.01 % SOLN Place 1 drop into the right eye daily. 05/10/22   [provider]  magnesium  oxide (MAG-OX) 400 MG tablet Take 400 mg by mouth 2 (two) times daily.    [provider]   [facility] ULTRA test strip USE AS DIRECTED TO CHECK BLOOD GLUCOSE EVERY DAY 04/16/22   [provider]  OXYGEN Inhale into the lungs. 2 litre at night    [provider]  Potassium Chloride ER 20 MEQ TBCR Take 1 tablet by mouth daily.    [provider]  pravastatin  (PRAVACHOL ) 20 MG tablet Take 20 mg by mouth at bedtime.    [provider]  quinapril (ACCUPRIL) 40 MG tablet Take 20 mg by mouth at bedtime.    [provider]  ranitidine (ZANTAC) 150 MG tablet Take 150 mg by mouth at bedtime. Patient not taking: Reported on 05/22/2022    [provider]  RHOPRESSA 0.02 % SOLN Apply 1 drop to eye  daily.    [provider]  roflumilast  (DALIRESP ) 500 MCG TABS tablet Take 500 mcg by mouth daily.    [provider]  SIMBRINZA 1-0.2 % SUSP 1 drop into affected eye Ophthalmic Three times a day 01/14/23   [provider]  TOUJEO  SOLOSTAR 300 UNIT/ML Solostar Pen Inject 15 Units into the skin at bedtime. 04/02/22   [provider]  Travoprost, BAK Free, (TRAVATAN) 0.004 % SOLN ophthalmic solution Place 1 drop into both eyes at bedtime.    [provider]  YUPELRI  175 MCG/3ML nebulizer solution Inhale one vial in nebulizer once daily. Do not mix with other nebulized medications. Patient taking differently: Take 175 mcg by nebulization daily. 03/15/22   Fernand Sigrid HERO, MD      VITAL SIGNS:  Blood pressure 128/68, pulse 68, temperature 98 F (36.7 C), resp. rate 19, SpO2 100%.  PHYSICAL EXAMINATION:  Physical Exam  GENERAL:  88 y.o.-year-old patient lying in the bed with no acute distress.  EYES: Pupils equal, round, reactive to light and accommodation. No scleral icterus. Extraocular muscles intact.  HEENT: Head atraumatic, normocephalic. Oropharynx and nasopharynx clear.  NECK:  Supple, no jugular venous distention. No thyroid enlargement, no tenderness.  LUNGS: Normal breath sounds bilaterally, no  wheezing, rales,rhonchi or crepitation. No use of accessory muscles of respiration.  CARDIOVASCULAR: Regular rate and rhythm, S1, S2 normal. No murmurs, rubs, or gallops.  ABDOMEN: Soft, nondistended, nontender. Bowel sounds present. No organomegaly or mass.  EXTREMITIES: No pedal edema, cyanosis, or clubbing.  NEUROLOGIC: Cranial nerves II through XII are intact. Muscle strength 5/5 in all extremities. Sensation intact. Gait not checked.  PSYCHIATRIC: The patient is alert and oriented x 3.  Normal affect and good eye contact. SKIN: No obvious rash, lesion, or ulcer.   LABORATORY PANEL:   CBC Recent Labs  Lab 03/21/24 2125  WBC 7.7  HGB 12.1*  HCT 39.7  PLT 205   ------------------------------------------------------------------------------------------------------------------  Chemistries  Recent Labs  Lab 03/21/24 2125  NA 134*  K 4.4  CL 83*  CO2 36*  GLUCOSE 368*  BUN 17  CREATININE 0.71  CALCIUM 8.5*  AST 13*  ALT 8  ALKPHOS 59  BILITOT 0.8   ------------------------------------------------------------------------------------------------------------------  Cardiac Enzymes No results for input(s): TROPONINI in the last 168 hours. ------------------------------------------------------------------------------------------------------------------  RADIOLOGY:  DG Chest Portable 1 View Result Date: 03/21/2024 EXAM: 1 VIEW XRAY OF THE CHEST 03/21/2024 09:35:45 PM COMPARISON: 05/22/2022 CLINICAL HISTORY: Short of breath. c/o shortness of breath. Pt with hx of COPD, on 3L [place] at baseline, found with O2 sat 80% with significant work of breathing FINDINGS: LUNGS AND PLEURA: Central pulmonary vascular congestion. Interstitial opacity scattered throughout the mid and lower lungs. Small bilateral pleural effusions. Consolidation in the left lung base. No pneumothorax. HEART AND MEDIASTINUM: The heart is enlarged. BONES AND SOFT TISSUES: No acute osseous abnormality. IMPRESSION:  1. Small bilateral pleural effusions 2. Left basilar pneumonia 3. Cardiomegaly 4. Pulmonary edema pattern Electronically signed by: Greig Pique MD 03/21/2024 09:39 PM EDT RP Workstation: [id number]      IMPRESSION AND PLAN:  Assessment and Plan: No notes have been filed under this hospital service. Service: Hospitalist      DVT prophylaxis: Lovenox ***  Advanced Care Planning:  Code Status: full code***  Family Communication:  The plan of care was discussed in details with the patient (and family). I answered all questions. The patient agreed to proceed with the above mentioned plan. Further management will depend upon hospital course.  Disposition Plan: Back to previous home environment Consults called: none***  All the records are reviewed and case discussed with ED provider.  Status is: Inpatient {Inpatient:23812}   At the time of the admission, it appears that the appropriate admission status for this patient is inpatient.  This is judged to be reasonable and necessary in order to provide the required intensity of service to ensure the patient's safety given the presenting symptoms, physical exam findings and initial radiographic and laboratory data in the context of comorbid conditions.  The patient requires inpatient status due to high intensity of service, high risk of further deterioration and high frequency of surveillance required.  I certify that at the time of admission, it is my clinical judgment that the patient will require inpatient hospital care extending more than 2 midnights.                            Dispo: The patient is from: Home              Anticipated d/c is to: Home              Patient currently is not medically stable to d/c.              Difficult to place patient: No  Madison DELENA Peaches M.D on 03/21/2024 at 11:51 PM  [facility]   From 7 PM-7 AM, contact night-coverage [email or web address]  CC: Primary care physician; Ileen Rosaline NOVAK, NP

## 2024-03-22 ENCOUNTER — Encounter: Payer: Self-pay | Admitting: Family Medicine

## 2024-03-22 ENCOUNTER — Other Ambulatory Visit: Payer: Self-pay

## 2024-03-22 DIAGNOSIS — J189 Pneumonia, unspecified organism: Secondary | ICD-10-CM

## 2024-03-22 DIAGNOSIS — E785 Hyperlipidemia, unspecified: Secondary | ICD-10-CM | POA: Insufficient documentation

## 2024-03-22 DIAGNOSIS — J9601 Acute respiratory failure with hypoxia: Secondary | ICD-10-CM | POA: Diagnosis not present

## 2024-03-22 DIAGNOSIS — J9602 Acute respiratory failure with hypercapnia: Secondary | ICD-10-CM | POA: Diagnosis not present

## 2024-03-22 DIAGNOSIS — I272 Pulmonary hypertension, unspecified: Secondary | ICD-10-CM | POA: Insufficient documentation

## 2024-03-22 DIAGNOSIS — E1165 Type 2 diabetes mellitus with hyperglycemia: Secondary | ICD-10-CM

## 2024-03-22 DIAGNOSIS — E1140 Type 2 diabetes mellitus with diabetic neuropathy, unspecified: Secondary | ICD-10-CM | POA: Insufficient documentation

## 2024-03-22 LAB — CBC
HCT: 39.1 % (ref 39.0–52.0)
Hemoglobin: 11.9 g/dL — ABNORMAL LOW (ref 13.0–17.0)
MCH: 30.4 pg (ref 26.0–34.0)
MCHC: 30.4 g/dL (ref 30.0–36.0)
MCV: 100 fL (ref 80.0–100.0)
Platelets: 197 K/uL (ref 150–400)
RBC: 3.91 MIL/uL — ABNORMAL LOW (ref 4.22–5.81)
RDW: 14.9 % (ref 11.5–15.5)
WBC: 6.2 K/uL (ref 4.0–10.5)
nRBC: 0 % (ref 0.0–0.2)

## 2024-03-22 LAB — GLUCOSE, CAPILLARY: Glucose-Capillary: 153 mg/dL — ABNORMAL HIGH (ref 70–99)

## 2024-03-22 LAB — CBG MONITORING, ED
Glucose-Capillary: 199 mg/dL — ABNORMAL HIGH (ref 70–99)
Glucose-Capillary: 139 mg/dL — ABNORMAL HIGH (ref 70–99)
Glucose-Capillary: 138 mg/dL — ABNORMAL HIGH (ref 70–99)

## 2024-03-22 LAB — CREATININE, SERUM
Creatinine, Ser: 0.92 mg/dL (ref 0.61–1.24)
GFR, Estimated: >60 mL/min (ref >60)

## 2024-03-22 LAB — TROPONIN I (HIGH SENSITIVITY): Troponin I (High Sensitivity): 14 ng/L (ref <18)

## 2024-03-22 MED ORDER — IPRATROPIUM-ALBUTEROL 0.5-2.5 (3) MG/3ML IN SOLN
3.0000 mL | Freq: Four times a day (QID) | RESPIRATORY_TRACT | Status: DC
  Administered 2024-03-22 – 2024-03-23 (×6): 3 mL via RESPIRATORY_TRACT
  Filled 2024-03-22 (×5): qty 3

## 2024-03-22 MED ORDER — IPRATROPIUM-ALBUTEROL 0.5-2.5 (3) MG/3ML IN SOLN: 3.0000 mL | RESPIRATORY_TRACT | Status: DC | PRN

## 2024-03-22 MED ORDER — METHYLPREDNISOLONE SODIUM SUCC 125 MG IJ SOLR
80.0000 mg | INTRAMUSCULAR | Status: DC
  Administered 2024-03-22 – 2024-03-23 (×2): 80 mg via INTRAVENOUS
  Filled 2024-03-22 (×2): qty 2

## 2024-03-22 MED ORDER — ZINC OXIDE 40 % EX OINT
TOPICAL_OINTMENT | Freq: Two times a day (BID) | CUTANEOUS | Status: DC
  Filled 2024-03-22: qty 113

## 2024-03-22 MED ORDER — SODIUM CHLORIDE 0.9 % IV SOLN
2.0000 g | INTRAVENOUS | Status: DC
  Administered 2024-03-22: 2 g via INTRAVENOUS
  Filled 2024-03-22: qty 20

## 2024-03-22 MED ORDER — METHYLPREDNISOLONE SODIUM SUCC 125 MG IJ SOLR
125.0000 mg | Freq: Once | INTRAMUSCULAR | Status: AC
  Administered 2024-03-22: 125 mg via INTRAVENOUS
  Filled 2024-03-22: qty 2

## 2024-03-22 MED ORDER — INSULIN ASPART 100 UNIT/ML IJ SOLN
0.0000 [IU] | Freq: Three times a day (TID) | INTRAMUSCULAR | Status: DC
  Administered 2024-03-22: 2 [IU] via SUBCUTANEOUS
  Administered 2024-03-22: 3 [IU] via SUBCUTANEOUS
  Administered 2024-03-22: 2 [IU] via SUBCUTANEOUS
  Administered 2024-03-23: 3 [IU] via SUBCUTANEOUS
  Filled 2024-03-22: qty 2
  Filled 2024-03-22: qty 3
  Filled 2024-03-22 (×2): qty 1

## 2024-03-22 MED ORDER — SODIUM CHLORIDE 0.9 % IV SOLN
500.0000 mg | INTRAVENOUS | Status: DC
  Administered 2024-03-22: 500 mg via INTRAVENOUS
  Filled 2024-03-22: qty 5

## 2024-03-22 MED ORDER — ENOXAPARIN SODIUM 40 MG/0.4ML IJ SOSY
40.0000 mg | PREFILLED_SYRINGE | INTRAMUSCULAR | Status: DC
  Administered 2024-03-22 – 2024-03-23 (×2): 40 mg via SUBCUTANEOUS
  Filled 2024-03-22 (×2): qty 0.4

## 2024-03-22 MED ORDER — DIPHENHYDRAMINE HCL 25 MG PO CAPS
25.0000 mg | ORAL_CAPSULE | Freq: Four times a day (QID) | ORAL | Status: DC | PRN
  Administered 2024-03-22: 25 mg via ORAL
  Filled 2024-03-22: qty 1

## 2024-03-22 NOTE — Assessment & Plan Note (Signed)
 -  We will continue statin therapy.

## 2024-03-22 NOTE — Assessment & Plan Note (Signed)
-   Will continue antibiotic therapy with IV Rocephin and Zithromax. - Mucolytic therapy be provided as well as duo nebs q.i.d. and q.4 hours p.r.n. - We will follow blood cultures.

## 2024-03-22 NOTE — Assessment & Plan Note (Signed)
-   The patient will be placed on supplement coverage with NovoLog . - Will continue basal coverage and follow blood glucose levels. - Will continue Glucotrol and Jardiance .

## 2024-03-22 NOTE — Assessment & Plan Note (Signed)
-   Will continue oral Roflumilast

## 2024-03-22 NOTE — ED Notes (Signed)
 Bipap removed for a trial period per patient's request. Patient placed on 5L of oxygen via nasal cannula as he says he wears that at night time. Appears to not be struggling to breathe without bipap on. Discussed trial off with RT.

## 2024-03-22 NOTE — Consult Note (Signed)
 WOC Nurse Consult Note: Reason for Consult: multiple wounds  Wound type: 1.  Stage 3 Pressure Injury Sacrum and L buttock pink moist (2 separate open areas on L buttock)  2.  Full thickness L arm appears to be healing wound from ? Trauma or other (pink moist)  3.  Left leg partial thickness dark hemorrhagic/ecchymotic area  Pressure Injury POA: Yes Measurement: see nursing flowsheet  Wound bed: as above  Drainage (amount, consistency, odor) see nursing flowsheet  Periwound: multiple areas of ecchymosis noted to legs; erythema surrounding sacral wound  Dressing procedure/placement/frequency:  Cleanse sacral and L buttock wounds with NS, apply silver hydrofiber (Aquacel [employer] Gerlean [phone]) cut to fit wound beds daily and secure with silicone foam. Cleanse L arm and L lower leg wound with NS, apply Xeroform gauze Soila [id number]) to wound beds daily and secure with silicone foam.  POC discussed with bedside nurse. WOC team will not follow. Re-consult if further needs arise.   Thank you,    Powell Bar MSN, RN-BC, [employer]

## 2024-03-22 NOTE — ED Notes (Signed)
 Pt has 3 dime size ulcers present on his buttocks.

## 2024-03-22 NOTE — Assessment & Plan Note (Addendum)
-   This is likely multifactorial due to COPD exacerbation possibly associated with left basal pneumonia as well as acute on chronic diastolic CHF. - The patient will be admitted to a progressive unit bed. - She will be continued on BiPAP. - Management otherwise as below.. -The patient likely has associated asthma exacerbation. - Will continue his Singulair

## 2024-03-22 NOTE — Assessment & Plan Note (Signed)
-   We will place the patient IV steroid therapy with IV Solu-Medrol  as well as nebulized bronchodilator therapy with duonebs q.i.d. and q.4 hours p.r.n.SABRA - Mucolytic therapy will be provided with Mucinex  and antibiotic therapy with IV Levaquin . - O2 protocol will be followed.

## 2024-03-22 NOTE — Assessment & Plan Note (Signed)
 Continue Neurontin

## 2024-03-22 NOTE — Progress Notes (Signed)
 Brandy in ER notified patient will be coming to semi-private room.

## 2024-03-22 NOTE — Progress Notes (Signed)
 PROGRESS NOTE [name]    DOB: 04-14-1936, 88 y.o.  FMW:[id number]    Code Status: Limited: Do not attempt resuscitation (DNR) -DNR-LIMITED -Do Not Intubate/DNI    DOA: 03/21/2024   LOS: 1  Brief hospital course  [name] is a 88 y.o. male with a PMH significant for COPD, diastolic CHF, GERD, type 2 diabetes mellitus, hypertension, dyslipidemia and hypothyroidism, who presented to the emergency room with acute onset of worsening dyspnea with associated productive cough and wheezing over the last 4 to 5 days.  ED Course: BP was 137/67 and respiratory rate was 28 with pulse oximetry of 86% on 4 L of O2 nasal cannula and later due to significant respiratory distress the patient was placed on BiPAP with pulse oximetry of 99 to 100% on 40 L of FiO2.  Labs revealed VBG with pH 7.4 HCO3 of 47.7 and pCO2 of 77 with pO2 of 39.  Sodium was 134 with chloride 83, CO2 36 and blood glucose of 368 with calcium 8.5, albumin 30.  BNP was 413.3 and high sensitive troponin was 10 and later was 14.  CBC showed mild anemia and respiratory panel came back negative. EKG: sinus rhythm with a rate of 74 with probable left atrial enlargement LVH with poor R wave progression. Imaging: Portable chest x-ray showed the following: 1. Small bilateral pleural effusions 2. Left basilar pneumonia 3. Cardiomegaly 4. Pulmonary edema pattern The patient was given 40 mg IV Lasix , DuoNeb as well as nebulized albuterol  2.5 mg twice in addition to BiPAP.  Patient was admitted to medicine service for further workup and management of COPD exacerbation as outlined in detail below.  03/22/24 -patient feeling improved today. Still not at baseline O2.   Assessment & Plan  Principal Problem:   Acute respiratory failure with hypoxia and hypercarbia (HCC) Active Problems:   COPD with acute exacerbation (HCC)   Acute on chronic diastolic CHF (congestive heart failure) (HCC)   Uncontrolled type 2 diabetes mellitus with  hyperglycemia, with long-term current use of insulin  (HCC)   Community acquired pneumonia of left lower lobe of lung   Hypothyroidism   Pulmonary hypertension (HCC)   Dyslipidemia   Diabetic neuropathy (HCC)  Acute respiratory failure with hypoxia and hypercarbia (HCC) Community acquired pneumonia of left lower lobe of lung COPD exacerbation - This is likely multifactorial due to COPD exacerbation possibly associated with left basal pneumonia as well as acute on chronic diastolic CHF. Initially on bipap and has now weaned to 5L [place]. Baseline around 3L. While speaking with the patient, was able to monitor him while weaned to 3.5L and did well at rest.  - continue steroids - continue breathing treatments - 5 days antibiotics for increased productive cough  - azithromycin  and CTX currently - wean to baseline O2 as tolerated - will need pulse ox with ambulation prior to dc - Will continue his Singulair  - Mucolytic therapy   Acute on chronic diastolic CHF- endorses good response to initial IV lasix  - We will continue diuresis with IV Lasix . -  I's and O's and daily weights. - continue Jardiance  and ACE inhibitor therapy.   Uncontrolled type 2 diabetes mellitus with hyperglycemia, with long-term current use of insulin  (HCC)- hyperglycemia expected with steroid use and was elevated on admission - moderate sliding scale added - continue glipizide - 15u lantus  daily   Diabetic neuropathy (HCC) - Continue Neurontin .   Dyslipidemia - We will continue statin therapy.   Pulmonary hypertension (HCC) - Continue oral  Roflumilast     Hypothyroidism - Will continue Synthroid .  Body mass index is 27.81 kg/m.  VTE ppx: lovenox   Diet:     There are no active orders of the following types: Diet, Nourishments.   Consultants: None   Subjective 03/22/24    Pt reports feeling improved. Has been coughing up strings of sputum.    Objective  Blood pressure (!) 144/63, pulse 64,  temperature 98.5 F (36.9 C), temperature source Oral, resp. rate 20, height 5' 10 (1.778 m), weight 87.9 kg, SpO2 97%.  Intake/Output Summary (Last 24 hours) at 03/22/2024 0803 Last data filed at 03/22/2024 0451 Gross per 24 hour  Intake 150 ml  Output 1550 ml  Net -1400 ml   Filed Weights   03/22/24 0300  Weight: 87.9 kg    Physical Exam:  General: awake, alert, NAD HEENT: atraumatic, blood-shot eyes with exophthalmia, hard of hearing Respiratory: normal respiratory effort. Wheezing on R lung fields. L lung crackles Cardiovascular: extremities well perfused, quick capillary refill, normal S1/S2, RRR, no JVD, murmurs Nervous: A&O x3. no gross focal neurologic deficits, normal speech Extremities: 2+ pitting edema to knees bilaterally with several superficial abrasions, signs of chronic venous stasis Skin: dry, intact, normal temperature, normal color. No rashes, lesions or ulcers on exposed skin Psychiatry: normal mood, congruent affect  Labs   I have personally reviewed the following labs and imaging studies CBC    Component Value Date/Time   WBC 7.7 03/21/2024 2125   RBC 3.92 (L) 03/21/2024 2125   HGB 12.1 (L) 03/21/2024 2125   HGB 14.4 08/01/2013 1207   HCT 39.7 03/21/2024 2125   HCT 43.0 08/01/2013 1207   PLT 205 03/21/2024 2125   PLT 198 08/01/2013 1207   MCV 101.3 (H) 03/21/2024 2125   MCV 95 08/01/2013 1207   MCH 30.9 03/21/2024 2125   MCHC 30.5 03/21/2024 2125   RDW 14.8 03/21/2024 2125   RDW 13.6 08/01/2013 1207   LYMPHSABS 1.6 03/21/2024 2125   LYMPHSABS 1.0 08/01/2013 1207   MONOABS 0.6 03/21/2024 2125   MONOABS 0.6 08/01/2013 1207   EOSABS 0.0 03/21/2024 2125   EOSABS 0.1 08/01/2013 1207   BASOSABS 0.0 03/21/2024 2125   BASOSABS 0.1 08/01/2013 1207      Latest Ref Rng & Units 03/21/2024    9:25 PM 11/05/2023    4:48 PM 05/24/2022    1:04 AM  BMP  Glucose 70 - 99 mg/dL 631  781  817   BUN 8 - 23 mg/dL 17  19  39   Creatinine 0.61 - 1.24 mg/dL  9.28  9.10  8.58   Sodium 135 - 145 mmol/L 134  131  133   Potassium 3.5 - 5.1 mmol/L 4.4  4.2  3.8   Chloride 98 - 111 mmol/L 83  85  90   CO2 22 - 32 mmol/L 36  32  32   Calcium 8.9 - 10.3 mg/dL 8.5  8.4  8.6    DG Chest Portable 1 View Result Date: 03/21/2024 EXAM: 1 VIEW XRAY OF THE CHEST 03/21/2024 09:35:45 PM COMPARISON: 05/22/2022 CLINICAL HISTORY: Short of breath. c/o shortness of breath. Pt with hx of COPD, on 3L [place] at baseline, found with O2 sat 80% with significant work of breathing FINDINGS: LUNGS AND PLEURA: Central pulmonary vascular congestion. Interstitial opacity scattered throughout the mid and lower lungs. Small bilateral pleural effusions. Consolidation in the left lung base. No pneumothorax. HEART AND MEDIASTINUM: The heart is enlarged. BONES AND SOFT TISSUES: No  acute osseous abnormality. IMPRESSION: 1. Small bilateral pleural effusions 2. Left basilar pneumonia 3. Cardiomegaly 4. Pulmonary edema pattern Electronically signed by: Greig Pique MD 03/21/2024 09:39 PM EDT RP Workstation: [id number]    Disposition Plan & Communication  Patient status: Inpatient  Admitted From: Home Planned disposition location: Home Anticipated discharge date: 10/17 pending O2 improvement   Family Communication: wife at bedside    Author: Marien LITTIE Piety, DO [facility] 03/22/2024, 8:03 AM   Available by Epic secure chat 7AM-7PM. If 7PM-7AM, please contact night-coverage.  [facility] contact information found on [email or web address].

## 2024-03-22 NOTE — Assessment & Plan Note (Signed)
-   Will continue Synthroid .

## 2024-03-22 NOTE — Assessment & Plan Note (Addendum)
-   We will continue diuresis with IV Lasix . - We Will follow serial troponins. - We will follow I's and O's and daily weights. - Will continue Jardiance  and ACE inhibitor therapy.

## 2024-03-23 DIAGNOSIS — J9601 Acute respiratory failure with hypoxia: Secondary | ICD-10-CM | POA: Diagnosis not present

## 2024-03-23 DIAGNOSIS — J441 Chronic obstructive pulmonary disease with (acute) exacerbation: Secondary | ICD-10-CM | POA: Diagnosis not present

## 2024-03-23 DIAGNOSIS — I509 Heart failure, unspecified: Secondary | ICD-10-CM | POA: Diagnosis not present

## 2024-03-23 DIAGNOSIS — L8990 Pressure ulcer of unspecified site, unspecified stage: Secondary | ICD-10-CM | POA: Insufficient documentation

## 2024-03-23 DIAGNOSIS — J9602 Acute respiratory failure with hypercapnia: Secondary | ICD-10-CM | POA: Diagnosis not present

## 2024-03-23 LAB — HEMOGLOBIN A1C
Hgb A1c MFr Bld: 7.9 % — ABNORMAL HIGH (ref 4.8–5.6)
Mean Plasma Glucose: 180.03 mg/dL

## 2024-03-23 LAB — BASIC METABOLIC PANEL WITH GFR
Anion gap: 9 (ref 5–15)
BUN: 19 mg/dL (ref 8–23)
CO2: 40 mmol/L — ABNORMAL HIGH (ref 22–32)
Calcium: 8.3 mg/dL — ABNORMAL LOW (ref 8.9–10.3)
Chloride: 88 mmol/L — ABNORMAL LOW (ref 98–111)
Creatinine, Ser: 0.88 mg/dL (ref 0.61–1.24)
GFR, Estimated: >60 mL/min (ref >60)
Glucose, Bld: 148 mg/dL — ABNORMAL HIGH (ref 70–99)
Potassium: 3.9 mmol/L (ref 3.5–5.1)
Sodium: 137 mmol/L (ref 135–145)

## 2024-03-23 LAB — GLUCOSE, CAPILLARY
Glucose-Capillary: 62 mg/dL — ABNORMAL LOW (ref 70–99)
Glucose-Capillary: 99 mg/dL (ref 70–99)
Glucose-Capillary: 193 mg/dL — ABNORMAL HIGH (ref 70–99)

## 2024-03-23 MED ORDER — INFLUENZA VAC SPLIT HIGH-DOSE 0.5 ML IM SUSY: 0.5000 mL | PREFILLED_SYRINGE | INTRAMUSCULAR | Status: DC

## 2024-03-23 MED ORDER — SACCHAROMYCES BOULARDII 250 MG PO CAPS: 250.0000 mg | ORAL_CAPSULE | Freq: Two times a day (BID) | ORAL | 0 refills | Status: AC

## 2024-03-23 MED ORDER — BISMUTH SUBSALICYLATE 262 MG/15ML PO SUSP
30.0000 mL | Freq: Three times a day (TID) | ORAL | Status: DC
  Administered 2024-03-23: 30 mL via ORAL
  Filled 2024-03-23: qty 118

## 2024-03-23 MED ORDER — PREDNISONE 20 MG PO TABS: 20.0000 mg | ORAL_TABLET | Freq: Every day | ORAL | 0 refills | Status: AC

## 2024-03-23 MED ORDER — INFLUENZA VAC SPLIT HIGH-DOSE 0.5 ML IM SUSY
0.5000 mL | PREFILLED_SYRINGE | INTRAMUSCULAR | Status: AC
  Administered 2024-03-23: 0.5 mL via INTRAMUSCULAR
  Filled 2024-03-23: qty 0.5

## 2024-03-23 MED ORDER — DOXYCYCLINE HYCLATE 100 MG PO TABS: 100.0000 mg | ORAL_TABLET | Freq: Two times a day (BID) | ORAL | 0 refills | Status: AC

## 2024-03-23 MED ORDER — BISMUTH SUBSALICYLATE 262 MG/15ML PO SUSP
30.0000 mL | ORAL | Status: DC | PRN
  Filled 2024-03-23: qty 118

## 2024-03-23 MED ORDER — FUROSEMIDE 20 MG PO TABS: 40.0000 mg | ORAL_TABLET | Freq: Every day | ORAL | 0 refills | Status: AC

## 2024-03-23 MED ADMIN — Saccharomyces boulardii Cap 250 MG: 250 mg | ORAL | NDC 00904723006

## 2024-03-23 MED FILL — Saccharomyces boulardii Cap 250 MG: 250.0000 mg | ORAL | Qty: 1 | Status: AC

## 2024-03-23 NOTE — Evaluation (Signed)
 Physical Therapy Evaluation and Discharge  Patient Details Name: [name] MRN: [id number] DOB: Apr 24, 1936 Today's Date: 03/23/2024  History of Present Illness  Patient is a 88 year old male with acute respiratory failure with hypoxia and hypercarbia, community acquired pneumonia of left lower lobe of lung, COPD exacerbation.  Clinical Impression  Patient is agreeable to PT evaluation. Patient lives with spouse. He is ambulatory short distance with rollator. He is on 3.5 L at baseline. He reports getting home health PT recently.  Today the patient ambulated a short distance in the room with rolling walker. Sp02 down to 86% on 4 L02 with ambulation. With seated rest break, Sp02 back to 91% with breathing techniques. Education on energy conservation strategies to use in home setting. The patient has DME in place at home. He is eager to be discharged today. Recommend to resume PT at home. No further acute PT needs as patient anticipated to discharge home today with family support.       If plan is discharge home, recommend the following: A little help with walking and/or transfers;A little help with bathing/dressing/bathroom;Assist for transportation;Help with stairs or ramp for entrance   Can travel by private vehicle        Equipment Recommendations None recommended by PT  Recommendations for Other Services       Functional Status Assessment Patient has had a recent decline in their functional status and demonstrates the ability to make significant improvements in function in a reasonable and predictable amount of time.     Precautions / Restrictions Precautions Precautions: Fall Recall of Precautions/Restrictions: Intact Restrictions Weight Bearing Restrictions Per Provider Order: No      Mobility  Bed Mobility Overal bed mobility: Needs Assistance Bed Mobility: Supine to Sit, Sit to Supine     Supine to sit: Min assist Sit to supine: Min assist   General bed mobility  comments: assistance for trunk support to sit upright. assistance for LE support to return to bed    Transfers Overall transfer level: Needs assistance Equipment used: Rolling walker (2 wheels) Transfers: Sit to/from Stand Sit to Stand: Contact guard assist, From elevated surface           General transfer comment: cues for safe hand placement    Ambulation/Gait Ambulation/Gait assistance: Contact guard assist Gait Distance (Feet): 30 Feet Assistive device: Rolling walker (2 wheels) Gait Pattern/deviations: Step-through pattern Gait velocity: decreased     General Gait Details: cues for technique. Sp02 down to 86% on 4 L and quickly increased to the low 90's on 4 L02.  Stairs            Wheelchair Mobility     Tilt Bed    Modified Rankin (Stroke Patients Only)       Balance Overall balance assessment: Needs assistance Sitting-balance support: Feet supported       Standing balance support: Bilateral upper extremity supported Standing balance-Leahy Scale: Fair Standing balance comment: RW for support in standing                             Pertinent Vitals/Pain Pain Assessment Pain Assessment: No/denies pain    Home Living Family/patient expects to be discharged to:: Private residence Living Arrangements: Spouse/significant other Available Help at Discharge: Family Type of Home: House Home Access: Ramped entrance       Home Layout: One level Home Equipment: Educational psychologist (4 wheels);Wheelchair - manual  Prior Function Prior Level of Function : Independent/Modified Independent             Mobility Comments: limtied distance ambulation. using walker for support       Extremity/Trunk Assessment   Upper Extremity Assessment Upper Extremity Assessment: Generalized weakness    Lower Extremity Assessment Lower Extremity Assessment: Generalized weakness       Communication   Communication Communication: No  apparent difficulties    Cognition Arousal: Alert Behavior During Therapy: WFL for tasks assessed/performed   PT - Cognitive impairments: No apparent impairments                         Following commands: Intact       Cueing Cueing Techniques: Verbal cues     General Comments General comments (skin integrity, edema, etc.): patient is fatigued with activity. education on energy conservation strategies provided to patient and spouse    Exercises     Assessment/Plan    PT Assessment All further PT needs can be met in the next venue of care  PT Problem List Cardiopulmonary status limiting activity;Decreased balance;Decreased mobility       PT Treatment Interventions      PT Goals (Current goals can be found in the Care Plan section)  Acute Rehab PT Goals PT Goal Formulation: All assessment and education complete, DC therapy    Frequency       Co-evaluation               AM-PAC PT 6 Clicks Mobility  Outcome Measure Help needed turning from your back to your side while in a flat bed without using bedrails?: A Little Help needed moving from lying on your back to sitting on the side of a flat bed without using bedrails?: A Little Help needed moving to and from a bed to a chair (including a wheelchair)?: A Little Help needed standing up from a chair using your arms (e.g., wheelchair or bedside chair)?: A Little Help needed to walk in hospital room?: A Little Help needed climbing 3-5 steps with a railing? : A Little 6 Click Score: 18    End of Session   Activity Tolerance: Patient tolerated treatment well Patient left: in bed;with call bell/phone within reach;with family/visitor present Nurse Communication: Mobility status PT Visit Diagnosis: Muscle weakness (generalized) (M62.81);Unsteadiness on feet (R26.81)    Time: 8694-8657 PT Time Calculation (min) (ACUTE ONLY): 37 min   Charges:   PT Evaluation $PT Eval Moderate Complexity: 1 Mod PT  Treatments $Therapeutic Activity: 8-22 mins PT General Charges $$ ACUTE PT VISIT: 1 Visit        Randine Essex, PT, MPT   Randine LULLA Essex 03/23/2024, 2:46 PM

## 2024-03-23 NOTE — Discharge Instructions (Signed)
 Please complete your antibiotics and steroids for treatment of your COPD exacerbation. Monitor your oxygen level at home since it looks like you need a higher level of oxygen when walking for now.  I have refilled your lasix  to continue removing fluid. Your compression stockings will also help with this.   Please follow up with your PCP in 1-2 weeks

## 2024-03-23 NOTE — TOC Transition Note (Signed)
 Transition of Care [facility]) - Discharge Note   Patient Details  Name: [name] MRN: [id number] Date of Birth: [birth year]  [facility] CM/SW Contact:  Aaron JAYSON Carpen, LCSW Phone Number: 03/23/2024, 2:18 PM   Clinical Narrative:  Patient has orders to discharge home today. Readmission prevention screen complete. CSW met with patient. Wife at bedside. CSW introduced role and explained that discharge planning would be discussed. PCP is Aaron Hoots, NP with [facility]. PCP office is aware that he is here. CSW left them a message to update them once discharge order was in. Wife drives patient to appointments. Pharmacy is [facility] in [place]. No issues affording medications. Patient lives home with his wife and cat. He is active with [facility] for PT. They can add OT and RN. He has a rollator, BSC, and shower chair at home. He confirmed he has home oxygen. Per wife, it's through [facility]. No further concerns. Wife will transport him home. CSW signing off.   Final next level of care: Home w Home Health Services Barriers to Discharge: No Barriers Identified   Patient Goals and CMS Choice            Discharge Placement                Patient to be transferred to facility by: Wife Name of family member notified: [name] Patient and family notified of of transfer: 03/23/24  Discharge Plan and Services Additional resources added to the After Visit Summary for                            [facility] Arranged: RN, PT, OT [facility] Agency: [facility] Date [facility] Agency Contacted: 03/23/24   Representative spoke with at [facility] Agency: [name]  Social Drivers of Health (SDOH) Interventions SDOH Screenings   Food Insecurity: No Food Insecurity (03/23/2024)  Housing: Low Risk  (03/23/2024)  Transportation Needs: No Transportation Needs (03/23/2024)  Utilities: Not At Risk (03/23/2024)  Alcohol Screen: Low Risk  (01/03/2018)  Depression (PHQ2-9):  Low Risk  (02/05/2020)  Social Connections: Unknown (03/23/2024)  Tobacco Use: Medium Risk (03/22/2024)     Readmission Risk Interventions    03/23/2024   12:40 PM 05/24/2022   10:29 AM  Readmission Risk Prevention Plan  Transportation Screening Complete Complete  PCP or Specialist Appt within 3-5 Days Complete Complete  HRI or Home Care Consult Complete Complete  Social Work Consult for Recovery Care Planning/Counseling Complete --  Palliative Care Screening Not Applicable Not Applicable  Medication Review Oceanographer) Complete Complete

## 2024-03-23 NOTE — Discharge Summary (Signed)
 Physician Discharge Summary  Patient: [name] FMW:[id number] DOB: [birth year]   Code Status: Limited: Do not attempt resuscitation (DNR) -DNR-LIMITED -Do Not Intubate/DNI  Admit date: 03/21/2024 Discharge date: 03/23/2024 Disposition: Home health, PT and OT PCP: Ileen Rosaline NOVAK, NP  Recommendations for Outpatient Follow-up:  Follow up with PCP within 1-2 weeks Regarding general hospital follow up and preventative care  Discharge Diagnoses:  Principal Problem:   Acute respiratory failure with hypoxia and hypercarbia (HCC) Active Problems:   COPD with acute exacerbation (HCC)   Acute on chronic diastolic CHF (congestive heart failure) (HCC)   Uncontrolled type 2 diabetes mellitus with hyperglycemia, with long-term current use of insulin  (HCC)   Community acquired pneumonia of left lower lobe of lung   Hypothyroidism   Pulmonary hypertension (HCC)   Dyslipidemia   Diabetic neuropathy (HCC)   Acute hypoxemic respiratory failure (HCC)   Acute on chronic congestive heart failure (HCC)   COPD exacerbation (HCC)   Pressure injury of skin  Brief Hospital Course Summary: [name] is a 88 y.o. male with a PMH significant for COPD, diastolic CHF, GERD, type 2 diabetes mellitus, hypertension, dyslipidemia and hypothyroidism, who presented to the emergency room with acute onset of worsening dyspnea with associated productive cough and wheezing over the last 4 to 5 days.  ED Course: BP was 137/67 and respiratory rate was 28 with pulse oximetry of 86% on 4 L of O2 nasal cannula and later due to significant respiratory distress the patient was placed on BiPAP with pulse oximetry of 99 to 100% on 40 L of FiO2.  Labs revealed VBG with pH 7.4 HCO3 of 47.7 and pCO2 of 77 with pO2 of 39.  Sodium was 134 with chloride 83, CO2 36 and blood glucose of 368 with calcium 8.5, albumin 30.  BNP was 413.3 and high sensitive troponin was 10 and later was 14.  CBC showed mild anemia and respiratory  panel came back negative. EKG: sinus rhythm with a rate of 74 with probable left atrial enlargement LVH with poor R wave progression. Imaging: Portable chest x-ray showed the following: 1. Small bilateral pleural effusions 2. Left basilar pneumonia 3. Cardiomegaly 4. Pulmonary edema pattern The patient was given 40 mg IV Lasix , DuoNeb as well as nebulized albuterol  2.5 mg twice in addition to BiPAP.   Patient was admitted to medicine service for further workup and management of COPD exacerbation. He was treated with Antibiotics, steroids, and bronchodilators. While he was initially on bipap briefly on presentation, he was quickly able to wean to his home O2 requirement even with ambulation.  PT evaluated and recommended HH. He can complete steroid and antibiotic course at home.   All other chronic conditions were treated with home medications.    Discharge Condition: Good, improved Recommended discharge diet: Regular healthy diet  Consultations: None   Procedures/Studies: None   Allergies as of 03/23/2024       Reactions   Lodine [etodolac] Itching   Cefaclor Rash        Medication List     STOP taking these medications    ranitidine 150 MG tablet Commonly known as: ZANTAC       TAKE these medications    albuterol  108 (90 Base) MCG/ACT inhaler Commonly known as: VENTOLIN  HFA Inhale 2 puffs into the lungs every 6 (six) hours as needed for wheezing or shortness of breath.   aspirin  EC 81 MG tablet Take 81 mg by mouth daily.   B-D ULTRAFINE III  SHORT PEN 31G X 8 MM Misc Generic drug: Insulin  Pen Needle Inject into the skin daily.   carvedilol  25 MG tablet Commonly known as: COREG  Take 25 mg by mouth 2 (two) times daily with a meal.   cholecalciferol 1000 units tablet Commonly known as: VITAMIN D Take 1,000 Units by mouth at bedtime.   doxycycline  100 MG tablet Commonly known as: VIBRA -TABS Take 1 tablet (100 mg total) by mouth 2 (two) times daily for 4  days.   furosemide  20 MG tablet Commonly known as: LASIX  Take 2 tablets (40 mg total) by mouth daily.   gabapentin  800 MG tablet Commonly known as: NEURONTIN  Take 800 mg by mouth every 8 (eight) hours.   glipiZIDE 10 MG tablet Commonly known as: GLUCOTROL Take 10 mg by mouth every morning.   ipratropium-albuterol  0.5-2.5 (3) MG/3ML Soln Commonly known as: DUONEB Take 3 mLs by nebulization every 4 (four) hours as needed. [id number]   Jardiance  25 MG Tabs tablet Generic drug: empagliflozin  Take 25 mg by mouth daily.   levothyroxine  137 MCG tablet Commonly known as: SYNTHROID  Take 137 mcg by mouth daily before breakfast.   liothyronine  5 MCG tablet Commonly known as: CYTOMEL  Take 5 mcg by mouth daily.   Lumigan 0.01 % Soln Generic drug: bimatoprost Place 1 drop into both eyes at bedtime.   magnesium  oxide 400 MG tablet Commonly known as: MAG-OX Take 400 mg by mouth daily.   montelukast  10 MG tablet Commonly known as: SINGULAIR  Take 10 mg by mouth daily.   mupirocin ointment 2 % Commonly known as: BACTROBAN Apply 1 Application topically 2 (two) times daily.   OneTouch Ultra test strip Generic drug: glucose blood USE AS DIRECTED TO CHECK BLOOD GLUCOSE EVERY DAY   OXYGEN Inhale into the lungs. 2 litre at night   Potassium Chloride ER 20 MEQ Tbcr Take 1 tablet by mouth daily.   pravastatin  20 MG tablet Commonly known as: PRAVACHOL  Take 20 mg by mouth at bedtime.   predniSONE  20 MG tablet Commonly known as: DELTASONE  Take 1 tablet (20 mg total) by mouth daily with breakfast for 1 day.   Rhopressa 0.02 % Soln Generic drug: Netarsudil Dimesylate Place 1 drop into the right eye daily.   Roflumilast  250 MCG Tabs TAKE 1 TABLET BY MOUTH EVERY DAY   rOPINIRole 1 MG tablet Commonly known as: REQUIP Take 1 mg by mouth at bedtime.   saccharomyces boulardii 250 MG capsule Commonly known as: FLORASTOR Take 1 capsule (250 mg total) by mouth 2 (two) times daily  for 15 days.   Simbrinza 1-0.2 % Susp Generic drug: Brinzolamide-Brimonidine  Place 1 drop into both eyes 2 (two) times daily.   Toujeo  SoloStar 300 UNIT/ML Solostar Pen Generic drug: insulin  glargine (1 Unit Dial) Inject 15 Units into the skin in the morning.   traZODone 50 MG tablet Commonly known as: DESYREL Take 50 mg by mouth at bedtime as needed.   Trelegy Ellipta  200-62.5-25 MCG/ACT Aepb Generic drug: Fluticasone -Umeclidin-Vilant Take 1 puff by mouth daily.         Subjective   Pt reports feeling well. Expresses improvement in his SOB and productive cough.   All questions and concerns were addressed at time of discharge.  Objective  Blood pressure 126/71, pulse 69, temperature 97.7 F (36.5 C), resp. rate 15, height 5' 10 (1.778 m), weight 87.9 kg, SpO2 94%.   General: Pt is alert, awake, not in acute distress Cardiovascular: RRR, S1/S2 +, no rubs, no gallops Respiratory: CTA bilaterally,  no wheezing, no rhonchi Abdominal: Soft, NT, ND, bowel sounds + Extremities: no edema, no cyanosis  The results of significant diagnostics from this hospitalization (including imaging, microbiology, ancillary and laboratory) are listed below for reference.   Imaging studies: DG Chest Portable 1 View Result Date: 03/21/2024 EXAM: 1 VIEW XRAY OF THE CHEST 03/21/2024 09:35:45 PM COMPARISON: 05/22/2022 CLINICAL HISTORY: Short of breath. c/o shortness of breath. Pt with hx of COPD, on 3L [place] at baseline, found with O2 sat 80% with significant work of breathing FINDINGS: LUNGS AND PLEURA: Central pulmonary vascular congestion. Interstitial opacity scattered throughout the mid and lower lungs. Small bilateral pleural effusions. Consolidation in the left lung base. No pneumothorax. HEART AND MEDIASTINUM: The heart is enlarged. BONES AND SOFT TISSUES: No acute osseous abnormality. IMPRESSION: 1. Small bilateral pleural effusions 2. Left basilar pneumonia 3. Cardiomegaly 4. Pulmonary edema  pattern Electronically signed by: Greig Pique MD 03/21/2024 09:39 PM EDT RP Workstation: [id number]    Labs: Basic Metabolic Panel: Recent Labs  Lab 03/21/24 2125 03/22/24 0905 03/23/24 1031  NA 134*  --  137  K 4.4  --  3.9  CL 83*  --  88*  CO2 36*  --  40*  GLUCOSE 368*  --  148*  BUN 17  --  19  CREATININE 0.71 0.92 0.88  CALCIUM 8.5*  --  8.3*   CBC: Recent Labs  Lab 03/21/24 2125 03/22/24 0905  WBC 7.7 6.2  NEUTROABS 5.5  --   HGB 12.1* 11.9*  HCT 39.7 39.1  MCV 101.3* 100.0  PLT 205 197   Microbiology: Results for orders placed or performed during the hospital encounter of 03/21/24  Resp panel by RT-PCR (RSV, Flu A&B, Covid) Anterior Nasal Swab     Status: None   Collection Time: 03/21/24  9:25 PM   Specimen: Anterior Nasal Swab  Result Value Ref Range Status   SARS Coronavirus 2 by RT PCR NEGATIVE NEGATIVE Final    Comment: (NOTE) SARS-CoV-2 target nucleic acids are NOT DETECTED.  The SARS-CoV-2 RNA is generally detectable in upper respiratory specimens during the acute phase of infection. The lowest concentration of SARS-CoV-2 viral copies this assay can detect is 138 copies/mL. A negative result does not preclude SARS-Cov-2 infection and should not be used as the sole basis for treatment or other patient management decisions. A negative result may occur with  improper specimen collection/handling, submission of specimen other than nasopharyngeal swab, presence of viral mutation(s) within the areas targeted by this assay, and inadequate number of viral copies(<138 copies/mL). A negative result must be combined with clinical observations, patient history, and epidemiological information. The expected result is Negative.  Fact Sheet for Patients:  BloggerCourse.com  Fact Sheet for Healthcare Providers:  SeriousBroker.it  This test is no t yet approved or cleared by the [place]  FDA and  has been  authorized for detection and/or diagnosis of SARS-CoV-2 by FDA under an Emergency Use Authorization (EUA). This EUA will remain  in effect (meaning this test can be used) for the duration of the COVID-19 declaration under Section 564(b)(1) of the Act, 21 U.S.C.section 360bbb-3(b)(1), unless the authorization is terminated  or revoked sooner.       Influenza A by PCR NEGATIVE NEGATIVE Final   Influenza B by PCR NEGATIVE NEGATIVE Final    Comment: (NOTE) The Xpert Xpress SARS-CoV-2/FLU/RSV plus assay is intended as an aid in the diagnosis of influenza from Nasopharyngeal swab specimens and should not be used as a sole basis  for treatment. Nasal washings and aspirates are unacceptable for Xpert Xpress SARS-CoV-2/FLU/RSV testing.  Fact Sheet for Patients: BloggerCourse.com  Fact Sheet for Healthcare Providers: SeriousBroker.it  This test is not yet approved or cleared by the [place]  FDA and has been authorized for detection and/or diagnosis of SARS-CoV-2 by FDA under an Emergency Use Authorization (EUA). This EUA will remain in effect (meaning this test can be used) for the duration of the COVID-19 declaration under Section 564(b)(1) of the Act, 21 U.S.C. section 360bbb-3(b)(1), unless the authorization is terminated or revoked.     Resp Syncytial Virus by PCR NEGATIVE NEGATIVE Final    Comment: (NOTE) Fact Sheet for Patients: BloggerCourse.com  Fact Sheet for Healthcare Providers: SeriousBroker.it  This test is not yet approved or cleared by the [place]  FDA and has been authorized for detection and/or diagnosis of SARS-CoV-2 by FDA under an Emergency Use Authorization (EUA). This EUA will remain in effect (meaning this test can be used) for the duration of the COVID-19 declaration under Section 564(b)(1) of the Act, 21 U.S.C. section 360bbb-3(b)(1), unless the  authorization is terminated or revoked.  Performed at [facility], [address] [place], [place] [address]     Time coordinating discharge: Over 30 minutes  Marien LITTIE Piety, MD  [facility] 03/23/2024, 1:52 PM

## 2024-03-23 NOTE — Inpatient Diabetes Management (Signed)
 Inpatient Diabetes Program Recommendations  AACE/ADA: New Consensus Statement on Inpatient Glycemic Control (2015)  Target Ranges:  Prepandial:   less than 140 mg/dL      Peak postprandial:   less than 180 mg/dL (1-2 hours)      Critically ill patients:  140 - 180 mg/dL    Latest Reference Range & Units 03/22/24 08:15 03/22/24 12:05 03/22/24 16:27 03/22/24 20:58  Glucose-Capillary 70 - 99 mg/dL 800 (H)  3 units Novolog   139 (H)  2 units Novolog   15 units Lantus  138 (H)  2 units Novolog   153 (H)  (H): Data is abnormally high  Latest Reference Range & Units 03/23/24 07:19  Glucose-Capillary 70 - 99 mg/dL 62 (L)  (L): Data is abnormally low   Home DM Meds:  Toujeo  15 units daily  Jardiance  25 mg daily  Glipizide 10 mg daily    Current Orders:  Lantus  15 units daily Novolog  0-15 units TID Jardiance  25 mg daily  Glipizide 10 mg daily    MD- Note Hypoglycemia this AM  Please consider reducing the Lantus  to 12 units daily    --Will follow patient during hospitalization--  Adina Rudolpho Arrow RN, MSN, CDCES Diabetes Coordinator Inpatient Glycemic Control Team Team Pager: [phone] (8a-5p)

## 2024-04-17 ENCOUNTER — Emergency Department

## 2024-04-17 ENCOUNTER — Inpatient Hospital Stay: Admit: 2024-04-17

## 2024-04-17 ENCOUNTER — Inpatient Hospital Stay
Admit: 2024-04-17 | Discharge: 2024-04-17 | Disposition: A | Discharge status: Home / Self Care | Attending: Student | Admitting: Student

## 2024-04-17 ENCOUNTER — Inpatient Hospital Stay
Admission: EM | Admit: 2024-04-17 | Discharge: 2024-04-19 | DRG: 229 | Disposition: A | Discharge status: Home Health | Attending: Internal Medicine | Admitting: Internal Medicine

## 2024-04-17 DIAGNOSIS — Z7984 Long term (current) use of oral hypoglycemic drugs: Secondary | ICD-10-CM | POA: Diagnosis not present

## 2024-04-17 DIAGNOSIS — E1165 Type 2 diabetes mellitus with hyperglycemia: Secondary | ICD-10-CM | POA: Diagnosis present

## 2024-04-17 DIAGNOSIS — Z79899 Other long term (current) drug therapy: Secondary | ICD-10-CM | POA: Diagnosis not present

## 2024-04-17 DIAGNOSIS — Z794 Long term (current) use of insulin: Secondary | ICD-10-CM

## 2024-04-17 DIAGNOSIS — I2510 Atherosclerotic heart disease of native coronary artery without angina pectoris: Secondary | ICD-10-CM | POA: Diagnosis present

## 2024-04-17 DIAGNOSIS — J449 Chronic obstructive pulmonary disease, unspecified: Secondary | ICD-10-CM | POA: Diagnosis present

## 2024-04-17 DIAGNOSIS — I5022 Chronic systolic (congestive) heart failure: Secondary | ICD-10-CM | POA: Diagnosis present

## 2024-04-17 DIAGNOSIS — Z9842 Cataract extraction status, left eye: Secondary | ICD-10-CM

## 2024-04-17 DIAGNOSIS — E7800 Pure hypercholesterolemia, unspecified: Secondary | ICD-10-CM | POA: Diagnosis present

## 2024-04-17 DIAGNOSIS — I130 Hypertensive heart and chronic kidney disease with heart failure and stage 1 through stage 4 chronic kidney disease, or unspecified chronic kidney disease: Secondary | ICD-10-CM | POA: Diagnosis present

## 2024-04-17 DIAGNOSIS — I5042 Chronic combined systolic (congestive) and diastolic (congestive) heart failure: Secondary | ICD-10-CM | POA: Diagnosis present

## 2024-04-17 DIAGNOSIS — E039 Hypothyroidism, unspecified: Secondary | ICD-10-CM | POA: Diagnosis present

## 2024-04-17 DIAGNOSIS — Z006 Encounter for examination for normal comparison and control in clinical research program: Secondary | ICD-10-CM

## 2024-04-17 DIAGNOSIS — Z888 Allergy status to other drugs, medicaments and biological substances status: Secondary | ICD-10-CM | POA: Diagnosis not present

## 2024-04-17 DIAGNOSIS — N1831 Chronic kidney disease, stage 3a: Secondary | ICD-10-CM | POA: Diagnosis present

## 2024-04-17 DIAGNOSIS — I442 Atrioventricular block, complete: Secondary | ICD-10-CM | POA: Diagnosis present

## 2024-04-17 DIAGNOSIS — E1151 Type 2 diabetes mellitus with diabetic peripheral angiopathy without gangrene: Secondary | ICD-10-CM | POA: Diagnosis present

## 2024-04-17 DIAGNOSIS — Z66 Do not resuscitate: Secondary | ICD-10-CM | POA: Diagnosis present

## 2024-04-17 DIAGNOSIS — Z961 Presence of intraocular lens: Secondary | ICD-10-CM | POA: Diagnosis present

## 2024-04-17 DIAGNOSIS — I10 Essential (primary) hypertension: Secondary | ICD-10-CM | POA: Diagnosis present

## 2024-04-17 DIAGNOSIS — Z833 Family history of diabetes mellitus: Secondary | ICD-10-CM

## 2024-04-17 DIAGNOSIS — J4489 Other specified chronic obstructive pulmonary disease: Secondary | ICD-10-CM | POA: Diagnosis present

## 2024-04-17 DIAGNOSIS — Z9841 Cataract extraction status, right eye: Secondary | ICD-10-CM

## 2024-04-17 DIAGNOSIS — Z9981 Dependence on supplemental oxygen: Secondary | ICD-10-CM

## 2024-04-17 DIAGNOSIS — E1122 Type 2 diabetes mellitus with diabetic chronic kidney disease: Secondary | ICD-10-CM | POA: Diagnosis present

## 2024-04-17 DIAGNOSIS — Z7982 Long term (current) use of aspirin: Secondary | ICD-10-CM

## 2024-04-17 DIAGNOSIS — Z9049 Acquired absence of other specified parts of digestive tract: Secondary | ICD-10-CM

## 2024-04-17 DIAGNOSIS — Z79890 Hormone replacement therapy (postmenopausal): Secondary | ICD-10-CM

## 2024-04-17 DIAGNOSIS — Z87891 Personal history of nicotine dependence: Secondary | ICD-10-CM | POA: Diagnosis not present

## 2024-04-17 DIAGNOSIS — R001 Bradycardia, unspecified: Principal | ICD-10-CM | POA: Diagnosis present

## 2024-04-17 LAB — CBC WITH DIFFERENTIAL/PLATELET
Abs Immature Granulocytes: 0.05 K/uL (ref 0.00–0.07)
Basophils Absolute: 0 K/uL (ref 0.0–0.1)
Basophils Relative: 0 %
Eosinophils Absolute: 0.1 K/uL (ref 0.0–0.5)
Eosinophils Relative: 1 %
HCT: 38 % — ABNORMAL LOW (ref 39.0–52.0)
Hemoglobin: 11.5 g/dL — ABNORMAL LOW (ref 13.0–17.0)
Immature Granulocytes: 1 %
Lymphocytes Relative: 23 %
Lymphs Abs: 2.2 K/uL (ref 0.7–4.0)
MCH: 30.3 pg (ref 26.0–34.0)
MCHC: 30.3 g/dL (ref 30.0–36.0)
MCV: 100.3 fL — ABNORMAL HIGH (ref 80.0–100.0)
Monocytes Absolute: 1.1 K/uL — ABNORMAL HIGH (ref 0.1–1.0)
Monocytes Relative: 12 %
Neutro Abs: 6.2 K/uL (ref 1.7–7.7)
Neutrophils Relative %: 63 %
Platelets: 160 K/uL (ref 150–400)
RBC: 3.79 MIL/uL — ABNORMAL LOW (ref 4.22–5.81)
RDW: 14.4 % (ref 11.5–15.5)
WBC: 9.7 K/uL (ref 4.0–10.5)
nRBC: 0 % (ref 0.0–0.2)

## 2024-04-17 LAB — PRO BRAIN NATRIURETIC PEPTIDE: Pro Brain Natriuretic Peptide: 2561 pg/mL — ABNORMAL HIGH (ref <300.0)

## 2024-04-17 LAB — COMPREHENSIVE METABOLIC PANEL WITH GFR
ALT: 6 U/L (ref 0–44)
AST: 14 U/L — ABNORMAL LOW (ref 15–41)
Albumin: 3.4 g/dL — ABNORMAL LOW (ref 3.5–5.0)
Alkaline Phosphatase: 73 U/L (ref 38–126)
Anion gap: 7 (ref 5–15)
BUN: 19 mg/dL (ref 8–23)
CO2: 38 mmol/L — ABNORMAL HIGH (ref 22–32)
Calcium: 9.1 mg/dL (ref 8.9–10.3)
Chloride: 89 mmol/L — ABNORMAL LOW (ref 98–111)
Creatinine, Ser: 0.92 mg/dL (ref 0.61–1.24)
GFR, Estimated: >60 mL/min (ref >60)
Glucose, Bld: 171 mg/dL — ABNORMAL HIGH (ref 70–99)
Potassium: 4.3 mmol/L (ref 3.5–5.1)
Sodium: 134 mmol/L — ABNORMAL LOW (ref 135–145)
Total Bilirubin: 0.4 mg/dL (ref 0.0–1.2)
Total Protein: 6.1 g/dL — ABNORMAL LOW (ref 6.5–8.1)

## 2024-04-17 LAB — CBG MONITORING, ED
Glucose-Capillary: 256 mg/dL — ABNORMAL HIGH (ref 70–99)
Glucose-Capillary: 184 mg/dL — ABNORMAL HIGH (ref 70–99)

## 2024-04-17 LAB — TROPONIN T, HIGH SENSITIVITY
Troponin T High Sensitivity: 39 ng/L — ABNORMAL HIGH (ref 0–19)
Troponin T High Sensitivity: 37 ng/L — ABNORMAL HIGH (ref 0–19)

## 2024-04-17 MED ORDER — ASPIRIN 81 MG PO TBEC
81.0000 mg | DELAYED_RELEASE_TABLET | Freq: Every day | ORAL | Status: DC
  Administered 2024-04-17 – 2024-04-19 (×2): 81 mg via ORAL
  Filled 2024-04-17 (×2): qty 1

## 2024-04-17 MED ORDER — INSULIN GLARGINE-YFGN 100 UNIT/ML ~~LOC~~ SOLN
15.0000 [IU] | Freq: Every day | SUBCUTANEOUS | Status: DC
  Administered 2024-04-19: 15 [IU] via SUBCUTANEOUS
  Filled 2024-04-17 (×2): qty 0.15

## 2024-04-17 MED ORDER — FUROSEMIDE 40 MG PO TABS: 40.0000 mg | ORAL_TABLET | Freq: Every day | ORAL | Status: DC

## 2024-04-17 MED ORDER — ONDANSETRON HCL 4 MG PO TABS: 4.0000 mg | ORAL_TABLET | Freq: Four times a day (QID) | ORAL | Status: DC | PRN

## 2024-04-17 MED ORDER — OXYCODONE HCL 5 MG PO TABS: 5.0000 mg | ORAL_TABLET | ORAL | Status: DC | PRN

## 2024-04-17 MED ORDER — FUROSEMIDE 10 MG/ML IJ SOLN
40.0000 mg | Freq: Once | INTRAMUSCULAR | Status: AC
  Administered 2024-04-17: 40 mg via INTRAVENOUS
  Filled 2024-04-17: qty 4

## 2024-04-17 MED ORDER — INSULIN ASPART 100 UNIT/ML IJ SOLN: 0.0000 [IU] | Freq: Every day | INTRAMUSCULAR | Status: DC

## 2024-04-17 MED ORDER — ONDANSETRON HCL 4 MG/2ML IJ SOLN
4.0000 mg | Freq: Four times a day (QID) | INTRAMUSCULAR | Status: DC | PRN
Start: 2024-04-17 — End: 2024-04-19

## 2024-04-17 MED ORDER — IPRATROPIUM-ALBUTEROL 0.5-2.5 (3) MG/3ML IN SOLN: 3.0000 mL | RESPIRATORY_TRACT | Status: DC | PRN

## 2024-04-17 MED ORDER — IPRATROPIUM-ALBUTEROL 0.5-2.5 (3) MG/3ML IN SOLN: 3.0000 mL | RESPIRATORY_TRACT | Status: AC | PRN

## 2024-04-17 MED ORDER — ACETAMINOPHEN 325 MG RE SUPP: 650.0000 mg | Freq: Four times a day (QID) | RECTAL | Status: DC | PRN

## 2024-04-17 MED ORDER — IPRATROPIUM-ALBUTEROL 0.5-2.5 (3) MG/3ML IN SOLN
3.0000 mL | Freq: Once | RESPIRATORY_TRACT | Status: AC
  Administered 2024-04-17: 3 mL via RESPIRATORY_TRACT
  Filled 2024-04-17: qty 3

## 2024-04-17 MED ORDER — LEVOTHYROXINE SODIUM 137 MCG PO TABS
137.0000 ug | ORAL_TABLET | Freq: Every day | ORAL | Status: DC
  Administered 2024-04-19: 137 ug via ORAL
  Filled 2024-04-17 (×2): qty 1

## 2024-04-17 MED ORDER — POTASSIUM CHLORIDE CRYS ER 20 MEQ PO TBCR
20.0000 meq | EXTENDED_RELEASE_TABLET | Freq: Every day | ORAL | Status: DC
  Administered 2024-04-17 – 2024-04-19 (×2): 20 meq via ORAL
  Filled 2024-04-17 (×2): qty 1

## 2024-04-17 MED ORDER — POLYETHYLENE GLYCOL 3350 17 G PO PACK: 17.0000 g | PACK | Freq: Every day | ORAL | Status: DC | PRN

## 2024-04-17 MED ORDER — ACETAMINOPHEN 325 MG PO TABS: 650.0000 mg | ORAL_TABLET | Freq: Four times a day (QID) | ORAL | Status: DC | PRN

## 2024-04-17 MED ORDER — ENOXAPARIN SODIUM 40 MG/0.4ML IJ SOSY
40.0000 mg | PREFILLED_SYRINGE | INTRAMUSCULAR | Status: DC
  Administered 2024-04-17 – 2024-04-19 (×2): 40 mg via SUBCUTANEOUS
  Filled 2024-04-17 (×2): qty 0.4

## 2024-04-17 MED ORDER — GABAPENTIN 400 MG PO CAPS
800.0000 mg | ORAL_CAPSULE | Freq: Three times a day (TID) | ORAL | Status: DC
  Administered 2024-04-17 – 2024-04-19 (×6): 800 mg via ORAL
  Filled 2024-04-17 (×8): qty 2
  Filled 2024-04-17: qty 8

## 2024-04-17 MED ORDER — PRAVASTATIN SODIUM 20 MG PO TABS
20.0000 mg | ORAL_TABLET | Freq: Every day | ORAL | Status: DC
  Administered 2024-04-17 – 2024-04-18 (×2): 20 mg via ORAL
  Filled 2024-04-17 (×2): qty 1

## 2024-04-17 MED ORDER — INSULIN ASPART 100 UNIT/ML IJ SOLN
0.0000 [IU] | Freq: Three times a day (TID) | INTRAMUSCULAR | Status: DC
  Administered 2024-04-17: 5 [IU] via SUBCUTANEOUS
  Administered 2024-04-18: 3 [IU] via SUBCUTANEOUS
  Administered 2024-04-19: 2 [IU] via SUBCUTANEOUS
  Administered 2024-04-19: 3 [IU] via SUBCUTANEOUS
  Filled 2024-04-17: qty 2
  Filled 2024-04-17 (×2): qty 3
  Filled 2024-04-17: qty 5

## 2024-04-17 NOTE — ED Notes (Signed)
 Pt asking for a condom cath. Informed pt this RN would look into what we could do to help pt.

## 2024-04-17 NOTE — ED Notes (Signed)
 This tech entered Pt's room to verify fall bundle and noticed Pt heart rate was 22. Primary RN made aware and MD notified. Pt and family deny any further needs at this time

## 2024-04-17 NOTE — Consult Note (Signed)
 [facility] CLINIC CARDIOLOGY CONSULT NOTE       Patient ID: [name] MRN: [id number] DOB/AGE: [birth year] 88 y.o.  Admit date: 04/17/2024 Referring Physician Dr. Artist Kerns Primary Physician Schmerge, Rosaline NOVAK, NP  Primary Cardiologist Dr. Hester (last seen 2023) Reason for Consultation symptomatic bradycardia  HPI: [name] is a 88 y.o. male  with a past medical history of COPD, diastolic CHF, GERD, type 2 diabetes mellitus, hypertension, dyslipidemia and hypothyroidism who presented to the ED on 04/17/2024 for weakness and SOB. Found to be significantly bradycardia and EMS gave atropine. Cardiology was consulted for further evaluation.   Patient reports that yesterday evening he began feeling lightheaded, squirrely.  Checked his heart rate and noticed that this was quite low in the 30s.  This morning when he woke up still did not feel great and heart rate was still low so he was brought to the ED for evaluation.  Workup in the ED notable for creatinine 0.92, potassium 4.3, hemoglobin 11.5, WBC 9.7. Troponin 39, BNP 2561. EKG in the ED complete heart block rate 20 bpm. CXR with overall improvement from prior last month, no significant pulm edema noted.  At the time of my evaluation this morning, he is resting in hospital bed with family at bedside.  We discussed his symptoms in further detail.  He states that yesterday he had sudden onset of feeling lightheaded and was noted to be bradycardic.  This continued into this morning and thus he was brought in for evaluation.  States that he has been tolerating his home medications well without any issues.  Family helps manage his medications, he does take carvedilol  at home but no concern for extra doses of this.  States that he does not have any chest pain.  States he has chronic shortness of breath and noticed this was maybe a little worse yesterday.  He wears oxygen at home and is currently on his home O2 requirement.  Despite his  significant bradycardia he is mentating well and is alert and oriented x 3.  Review of systems complete and found to be negative unless listed above    Past Medical History:  Diagnosis Date   Arthritis    Asthma    CHF (congestive heart failure) (HCC)    COPD (chronic obstructive pulmonary disease) (HCC)    Diabetes mellitus without complication (HCC)    GERD (gastroesophageal reflux disease)    H/O Graves' disease    H/O wheezing    HOH (hard of hearing)    Hypercholesterolemia    Hypertension    Hypothyroidism    Lower extremity edema    Multiple allergies    Palpitations    Peripheral vascular disease    swelling of feet and legs   Pneumonia    in the past   PONV (postoperative nausea and vomiting)    Shortness of breath dyspnea    Sleep apnea    CPAP    Past Surgical History:  Procedure Laterality Date   CARDIAC CATHETERIZATION     CATARACT EXTRACTION W/PHACO Right 09/01/2015   Procedure: CATARACT EXTRACTION PHACO AND INTRAOCULAR LENS PLACEMENT (IOC);  Surgeon: Steven Dingeldein, MD;  Location: [facility] ORS;  Service: Ophthalmology;  Laterality: Right;  US  01:33 AP% 24.9 CDE 42.99 fluid pack lot # [id number]   CATARACT EXTRACTION W/PHACO Left 09/22/2015   Procedure: CATARACT EXTRACTION PHACO AND INTRAOCULAR LENS PLACEMENT (IOC);  Surgeon: Steven Dingeldein, MD;  Location: [facility] ORS;  Service: Ophthalmology;  Laterality: Left;  US   01:28 AP% 20.9 CDE 29.54 fluid pack lot # [id number]   CHOLECYSTECTOMY     EYE SURGERY     Orbital Decompression     TONSILLECTOMY      (Not in a hospital admission)  Social History   Socioeconomic History   Marital status: Married    Spouse name: Not on file   Number of children: Not on file   Years of education: Not on file   Highest education level: Not on file  Occupational History   Not on file  Tobacco Use   Smoking status: Former   Smokeless tobacco: Never  Substance and Sexual Activity   Alcohol use: Yes    Comment: couple  glasses of wine daily   Drug use: No   Sexual activity: Not on file  Other Topics Concern   Not on file  Social History Narrative   Not on file   Social Drivers of Health   Financial Resource Strain: Not on file  Food Insecurity: No Food Insecurity (03/23/2024)   Hunger Vital Sign    Worried About Running Out of Food in the Last Year: Never true    Ran Out of Food in the Last Year: Never true  Transportation Needs: No Transportation Needs (03/23/2024)   PRAPARE - Administrator, Civil Service (Medical): No    Lack of Transportation (Non-Medical): No  Physical Activity: Not on file  Stress: Not on file  Social Connections: Unknown (03/23/2024)   Social Connection and Isolation Panel    Frequency of Communication with Friends and Family: Patient declined    Frequency of Social Gatherings with Friends and Family: Not on file    Attends Religious Services: Not on file    Active Member of Clubs or Organizations: Not on file    Attends Banker Meetings: Not on file    Marital Status: Not on file  Intimate Partner Violence: Not At Risk (03/23/2024)   Humiliation, Afraid, Rape, and Kick questionnaire    Fear of Current or Ex-Partner: No    Emotionally Abused: No    Physically Abused: No    Sexually Abused: No    Family History  Problem Relation Age of Onset   Diabetes Father      Vitals:   04/17/24 1014 04/17/24 1015 04/17/24 1027 04/17/24 1101  BP: (!) 100/40 (!) 100/40 105/81 (!) 112/91  Pulse:  (!) 33    Resp:  18    Temp:  97.7 F (36.5 C)    TempSrc:      SpO2:  99%      PHYSICAL EXAM General: Chronically ill-appearing elderly male, well nourished, in no acute distress. HEENT: Normocephalic and atraumatic. Neck: No JVD.  Lungs: Normal respiratory effort on 4L [place]. Clear bilaterally to auscultation. No wheezes, crackles, rhonchi.  Heart: HRR, slow rate. Normal S1 and S2 without gallops or murmurs.  Abdomen: Non-distended appearing.  Msk:  Normal strength and tone for age. Extremities: Warm and well perfused. No clubbing, cyanosis.  No edema.  Neuro: Alert and oriented X 3. Psych: Answers questions appropriately.   Labs: Basic Metabolic Panel: Recent Labs    04/17/24 1006  NA 134*  K 4.3  CL 89*  CO2 38*  GLUCOSE 171*  BUN 19  CREATININE 0.92  CALCIUM 9.1   Liver Function Tests: Recent Labs    04/17/24 1006  AST 14*  ALT 6  ALKPHOS 73  BILITOT 0.4  PROT 6.1*  ALBUMIN 3.4*   No  results for input(s): LIPASE, AMYLASE in the last 72 hours. CBC: Recent Labs    04/17/24 1006  WBC 9.7  NEUTROABS 6.2  HGB 11.5*  HCT 38.0*  MCV 100.3*  PLT 160   Cardiac Enzymes: No results for input(s): CKTOTAL, CKMB, CKMBINDEX, TROPONINIHS in the last 72 hours. BNP: No results for input(s): BNP in the last 72 hours. D-Dimer: No results for input(s): DDIMER in the last 72 hours. Hemoglobin A1C: No results for input(s): HGBA1C in the last 72 hours. Fasting Lipid Panel: No results for input(s): CHOL, HDL, LDLCALC, TRIG, CHOLHDL, LDLDIRECT in the last 72 hours. Thyroid Function Tests: No results for input(s): TSH, T4TOTAL, T3FREE, THYROIDAB in the last 72 hours.  Invalid input(s): FREET3 Anemia Panel: No results for input(s): VITAMINB12, FOLATE, FERRITIN, TIBC, IRON, RETICCTPCT in the last 72 hours.   Radiology: DG Chest Portable 1 View Result Date: 03/21/2024 EXAM: 1 VIEW XRAY OF THE CHEST 03/21/2024 09:35:45 PM COMPARISON: 05/22/2022 CLINICAL HISTORY: Short of breath. c/o shortness of breath. Pt with hx of COPD, on 3L [place] at baseline, found with O2 sat 80% with significant work of breathing FINDINGS: LUNGS AND PLEURA: Central pulmonary vascular congestion. Interstitial opacity scattered throughout the mid and lower lungs. Small bilateral pleural effusions. Consolidation in the left lung base. No pneumothorax. HEART AND MEDIASTINUM: The heart is enlarged. BONES AND  SOFT TISSUES: No acute osseous abnormality. IMPRESSION: 1. Small bilateral pleural effusions 2. Left basilar pneumonia 3. Cardiomegaly 4. Pulmonary edema pattern Electronically signed by: Greig Pique MD 03/21/2024 09:39 PM EDT RP Workstation: [id number]    ECHO ordered  TELEMETRY (personally reviewed): Complete heart block rate 20s  EKG (personally reviewed): Complete heart block rate 20 bpm  Data reviewed by me 04/17/2024: last 24h vitals tele labs imaging I/O ED provider note, admission H&P  Active Problems:   * No active hospital problems. *    ASSESSMENT AND PLAN:  [name] is a 88 y.o. male  with a past medical history of COPD, diastolic CHF, GERD, type 2 diabetes mellitus, hypertension, dyslipidemia and hypothyroidism who presented to the ED on 04/17/2024 for weakness and SOB. Found to be significantly bradycardia and EMS gave atropine. Cardiology was consulted for further evaluation.   # Complete heart block # Coronary artery disease # Hypertension Patient presented in complete heart block with heart rates in the 20s.  Home Coreg  has been held.  Troponins mildly elevated and flat.  BNP elevated but he is without significant change in his chronic shortness of breath and is on his home O2 requirement. - Echo ordered, to be done this afternoon. - Will plan to proceed with Micra pacemaker placement tomorrow morning.  Patient, wife, daughter are all in agreement.  Procedure has been discussed in detail with patient and family.  All questions have been answered.  Written consent will be obtained.  Patient expresses that he does have active DNR, understands that this does get temporarily revoked in the setting of procedures.  Had an extensive discussion with patient, wife, daughter about goals of care. - At this time he is mentating well and systolic BPs have been stable, given this will defer temporary wire at this time.  Would not hesitate to proceed with temp wire if mental status  changes or if BP becomes unstable.  Call team should be utilized for this if it is needed. - Minimal and flat troponin most consistent with demand/supply mismatch and not ACS. - Continue home pravastatin  20 mg daily, aspirin  81 mg  daily. - Will give 1 dose of IV Lasix  today and assess response.   This patient's plan of care was discussed and created with Dr. Ammon and he is in agreement.  Signed: Danita Bloch, PA-C  04/17/2024, 11:11 AM [facility] Card[facility]

## 2024-04-17 NOTE — ED Triage Notes (Signed)
 Pt comes from home BIB [facility] for weakness and SOB for last 12 hours. Pt HR w/ EMS was 26 bpm. BP 100/60 and decreased to 80/50. Pt stayed A&Ox4. EMS administered atropine Pt comes w/ DNR form. Hx of CHF, COPD (wears 4l O2 at home), arterial blockage

## 2024-04-17 NOTE — ED Provider Notes (Signed)
 [facility] Provider Note   Event Date/Time   First MD Initiated Contact with Patient 04/17/24 1009     (approximate) History  Weakness and Bradycardia  HPI [name] is a 88 y.o. male with a past medical history of hypertension, type 2 diabetes, Graves' disease, CHF, and COPD on 4 L nasal cannula chronically who presents via EMS with a DNR complaining of generalized weakness and shortness of breath for the last 12 hours.  EMS noted patient's heart rate to be 26 with a possible third-degree heart block on their rhythm strip.  Patient had initial blood pressure of 100/60 however decreased to 80/50 and was administered 1 mg of atropine with improvement of his blood pressure but no improvement in his heart rate.  Patient only complains of persistent weakness.  Patient's oxygen was increased to 5 L however patient does not feel any decreased shortness of breath.  Of note patient uses carvedilol  and states that he does not feel that he has doubled up on his dosage and has had no change in his dosing recently. ROS: Patient currently denies any vision changes, tinnitus, difficulty speaking, facial droop, sore throat, chest pain, abdominal pain, nausea/vomiting/diarrhea, dysuria, or weakness/numbness/paresthesias in any extremity   Physical Exam  Triage Vital Signs: ED Triage Vitals  Encounter Vitals Group     BP --      Girls Systolic BP Percentile --      Girls Diastolic BP Percentile --      Boys Systolic BP Percentile --      Boys Diastolic BP Percentile --      Pulse Rate 04/17/24 1005 (!) 26     Resp 04/17/24 1005 (!) 24     Temp --      Temp src --      SpO2 04/17/24 1005 95 %     Weight --      Height --      Head Circumference --      Peak Flow --      Pain Score 04/17/24 1007 0     Pain Loc --      Pain Education --      Exclude from Growth Chart --    Most recent vital signs: Vitals:   04/17/24 1027 04/17/24 1101  BP: 105/81 (!) 112/91  Pulse:     Resp:    Temp:    SpO2:     General: Awake, oriented x4. CV:  Good peripheral perfusion. Resp:  Normal effort. Abd:  No distention. Other:  Elderly well-developed, well-nourished Caucasian male resting comfortably in no acute distress ED Results / Procedures / Treatments  Labs (all labs ordered are listed, but only abnormal results are displayed) Labs Reviewed  COMPREHENSIVE METABOLIC PANEL WITH GFR - Abnormal; Notable for the following components:      Result Value   Sodium 134 (*)    Chloride 89 (*)    CO2 38 (*)    Glucose, Bld 171 (*)    Total Protein 6.1 (*)    Albumin 3.4 (*)    AST 14 (*)    All other components within normal limits  PRO BRAIN NATRIURETIC PEPTIDE - Abnormal; Notable for the following components:   Pro Brain Natriuretic Peptide 2,561.0 (*)    All other components within normal limits  CBC WITH DIFFERENTIAL/PLATELET - Abnormal; Notable for the following components:   RBC 3.79 (*)    Hemoglobin 11.5 (*)    HCT 38.0 (*)  MCV 100.3 (*)    Monocytes Absolute 1.1 (*)    All other components within normal limits  TROPONIN T, HIGH SENSITIVITY - Abnormal; Notable for the following components:   Troponin T High Sensitivity 39 (*)    All other components within normal limits   EKG ED ECG REPORT I, Artist MARLA Kerns, the attending physician, personally viewed and interpreted this ECG. Date: 04/17/2024 EKG Time: 1052 Rate: 20 Rhythm: Bradycardia, third-degree heart block QRS Axis: normal Intervals: Third-degree heart block ST/T Wave abnormalities: normal Narrative Interpretation: Bradycardic rhythm and third-degree heart block.  No evidence of acute ischemia RADIOLOGY ED MD interpretation: Single view portable chest x-ray shows probable tiny bilateral pleural effusions - All radiology independently interpreted and agree with radiology assessment Official radiology report(s): DG Chest Port 1 View Result Date: 04/17/2024 CLINICAL DATA:  Weakness.   Bradycardia. EXAM: PORTABLE CHEST 1 VIEW COMPARISON:  03/21/2024 FINDINGS: The cardio pericardial silhouette is enlarged. Vascular congestion with interstitial opacity suggests edema, similar to prior. Improved aeration in the lung bases bilaterally with some residual basilar atelectasis and probable tiny bilateral pleural effusions. Telemetry leads overlie the chest. IMPRESSION: Interval improvement in aeration in the lung bases with some residual basilar atelectasis and probable tiny bilateral pleural effusions. Electronically Signed   By: Camellia Candle M.D.   On: 04/17/2024 11:11   PROCEDURES: Critical Care performed: Yes, see critical care procedure note(s) .1-3 Lead EKG Interpretation  Performed by: Kerns Artist MARLA, MD Authorized by: Kerns Artist MARLA, MD     Interpretation: abnormal     ECG rate:  21   ECG rate assessment: bradycardic     Rhythm: A-V block     Ectopy: none     Conduction: abnormal     Abnormal conduction: 3rd degree AV block   CRITICAL CARE Performed by: Bertice Risse K Lundon Verdejo  Total critical care time: 41 minutes  Critical care time was exclusive of separately billable procedures and treating other patients.  Critical care was necessary to treat or prevent imminent or life-threatening deterioration.  Critical care was time spent personally by me on the following activities: development of treatment plan with patient and/or surrogate as well as nursing, discussions with consultants, evaluation of patient's response to treatment, examination of patient, obtaining history from patient or surrogate, ordering and performing treatments and interventions, ordering and review of laboratory studies, ordering and review of radiographic studies, pulse oximetry and re-evaluation of patient's condition.  MEDICATIONS ORDERED IN ED: Medications  ipratropium-albuterol  (DUONEB) 0.5-2.5 (3) MG/3ML nebulizer solution 3 mL (3 mLs Nebulization Given 04/17/24 1105)   IMPRESSION / MDM /  ASSESSMENT AND PLAN / ED COURSE  I reviewed the triage vital signs and the nursing notes.                             The patient is on the cardiac monitor to evaluate for evidence of arrhythmia and/or significant heart rate changes. Patient's presentation is most consistent with acute presentation with potential threat to life or bodily function. 88 year old male with the above-stated past medical history presents for bradycardia, weakness, and shortness of breath DDx: Third-degree heart block, ACS, CHF exacerbation, medication overdose Plan: CBC, CMP, troponin, BNP, chest x-ray, EKG  EKG showing third-degree heart block.  I spoke to Dr. Ammon and cardiology who agrees to evaluate this patient for further recommendations.  Does not recommend pacing for vasopressor medications unless pressure drops again.  I spoke to Dr. Paudel  on the hospitalist service who agrees to accept this patient for further evaluation and management.  Dispo: Admit to medicine   FINAL CLINICAL IMPRESSION(S) / ED DIAGNOSES   Final diagnoses:  Symptomatic bradycardia  Complete heart block by electrocardiogram [facility])   Rx / DC Orders   ED Discharge Orders     None      Note:  This document was prepared using Dragon voice recognition software and may include unintentional dictation errors.   Jax Kentner K, MD 04/17/24 [phone]

## 2024-04-17 NOTE — Progress Notes (Signed)
*  PRELIMINARY RESULTS* Echocardiogram 2D Echocardiogram has been performed.  [name] 04/17/2024, 2:29 PM

## 2024-04-17 NOTE — ED Notes (Signed)
 In and out cath performed on patient. Patient has small pink areas of skin breakdown over sacrum. This RN applied barrier cream and placed a pillow under the patient to relieve pressure on the sacrum. Patient's bed was returned to lowest position and call bell placed within patient's reach.

## 2024-04-17 NOTE — ED Notes (Signed)
 This tech and Nurse sydney changed pt after they had a bowel movement at this time. New chuck and brief were placed.

## 2024-04-17 NOTE — H&P (Addendum)
 History and Physical    [name]:[id number] DOB: 01/10/1936 DOA: 04/17/2024  DOS: the patient was seen and examined on 04/17/2024  PCP: Ileen Rosaline NOVAK, NP   Patient coming from: Home  I have personally briefly reviewed patient's old medical records in [facility]  Chief Complaint: Low heart rate and generalized weakness starting this morning  HPI: [name] is a pleasant 88 y.o. male with medical history significant for COPD, HFpEF, GERD, DM, HTN, HLD, hypothyroidism who presented to ED on 04/17/2024 for generalized weakness and low heart heartbeat at home.  Patient stated that he checks his blood pressure and heart rate every morning.  While checking he found that his heart rate was in 30s and blood pressure was low and he called ambulance.  EMS was called and found to have heart rate was 26, blood pressure was 110/60 and decreased to 80/50.  Patient was alert awake and oriented x 4.  EMS administered atropine.  Patient comes with a DNR form, history of CHF, COPD wears 4 L home oxygen at home via nasal cannula.  Patient denies any fever, cough, palpitations, nausea vomiting diarrhea.  However, he complains of mild worsening shortness of breath and ankle edema.  ED Course: Upon arrival to the ED, patient is found to be hypotensive and bradycardic, pulse rate was 26, respiratory rate 24, saturating 95%, blood pressure was 105/81, he was alert awake and oriented x 4, proBNP was 2561 troponin 39, EKG showed third-degree AV block.  Patient was given 1 dose of Ativan by EMS.  In ED, cardiology service was consulted, patient was placed on pacer pads on chest wall and hospital service was consulted for evaluation for admission.  Review of Systems:  ROS  All other systems negative except as noted in the HPI.  Past Medical History:  Diagnosis Date   Arthritis    Asthma    CHF (congestive heart failure) (HCC)    COPD (chronic obstructive pulmonary disease) (HCC)     Diabetes mellitus without complication (HCC)    GERD (gastroesophageal reflux disease)    H/O Graves' disease    H/O wheezing    HOH (hard of hearing)    Hypercholesterolemia    Hypertension    Hypothyroidism    Lower extremity edema    Multiple allergies    Palpitations    Peripheral vascular disease    swelling of feet and legs   Pneumonia    in the past   PONV (postoperative nausea and vomiting)    Shortness of breath dyspnea    Sleep apnea    CPAP    Past Surgical History:  Procedure Laterality Date   CARDIAC CATHETERIZATION     CATARACT EXTRACTION W/PHACO Right 09/01/2015   Procedure: CATARACT EXTRACTION PHACO AND INTRAOCULAR LENS PLACEMENT (IOC);  Surgeon: Steven Dingeldein, MD;  Location: [facility] ORS;  Service: Ophthalmology;  Laterality: Right;  US  01:33 AP% 24.9 CDE 42.99 fluid pack lot # [id number]   CATARACT EXTRACTION W/PHACO Left 09/22/2015   Procedure: CATARACT EXTRACTION PHACO AND INTRAOCULAR LENS PLACEMENT (IOC);  Surgeon: Steven Dingeldein, MD;  Location: [facility] ORS;  Service: Ophthalmology;  Laterality: Left;  US  01:28 AP% 20.9 CDE 29.54 fluid pack lot # [id number]   CHOLECYSTECTOMY     EYE SURGERY     Orbital Decompression     TONSILLECTOMY       reports that he has quit smoking. He has never used smokeless tobacco. He reports current alcohol use. He reports  that he does not use drugs.  Allergies  Allergen Reactions   Lodine [Etodolac] Itching   Cefaclor Rash    Family History  Problem Relation Age of Onset   Diabetes Father     Prior to Admission medications   Medication Sig Start Date End Date Taking? Authorizing Provider  albuterol  (VENTOLIN  HFA) 108 (90 Base) MCG/ACT inhaler Inhale 2 puffs into the lungs every 6 (six) hours as needed for wheezing or shortness of breath. 12/30/21   Liana Fish, NP  aspirin  EC 81 MG tablet Take 81 mg by mouth daily.    [provider]  B-D ULTRAFINE III SHORT PEN 31G X 8 MM MISC Inject into the skin  daily. 03/25/23   [provider]  cholecalciferol (VITAMIN D) 1000 units tablet Take 1,000 Units by mouth at bedtime.     [provider]  furosemide  (LASIX ) 20 MG tablet Take 2 tablets (40 mg total) by mouth daily. 03/23/24   Lenon Marien CROME, MD  gabapentin  (NEURONTIN ) 800 MG tablet Take 800 mg by mouth every 8 (eight) hours.    [provider]  glipiZIDE (GLUCOTROL) 10 MG tablet Take 10 mg by mouth every morning. 03/06/24   [provider]  ipratropium-albuterol  (DUONEB) 0.5-2.5 (3) MG/3ML SOLN Take 3 mLs by nebulization every 4 (four) hours as needed. [id number] 04/15/21   Liana Fish, NP  JARDIANCE  25 MG TABS tablet Take 25 mg by mouth daily.    [provider]  levothyroxine  (SYNTHROID , LEVOTHROID) 137 MCG tablet Take 137 mcg by mouth daily before breakfast.    [provider]  liothyronine  (CYTOMEL ) 5 MCG tablet Take 5 mcg by mouth daily. 04/16/22   [provider]  LUMIGAN 0.01 % SOLN Place 1 drop into both eyes at bedtime. 05/10/22   [provider]  magnesium  oxide (MAG-OX) 400 MG tablet Take 400 mg by mouth daily.    [provider]  montelukast  (SINGULAIR ) 10 MG tablet Take 10 mg by mouth daily.    [provider]  mupirocin ointment (BACTROBAN) 2 % Apply 1 Application topically 2 (two) times daily. 01/25/24   [provider]  ONETOUCH ULTRA test strip USE AS DIRECTED TO CHECK BLOOD GLUCOSE EVERY DAY 04/16/22   [provider]  OXYGEN Inhale into the lungs. 2 litre at night    [provider]  Potassium Chloride ER 20 MEQ TBCR Take 1 tablet by mouth daily.    [provider]  pravastatin  (PRAVACHOL ) 20 MG tablet Take 20 mg by mouth at bedtime.    [provider]  RHOPRESSA 0.02 % SOLN Place 1 drop into the right eye daily.    [provider]  Roflumilast  250 MCG TABS TAKE 1 TABLET BY MOUTH EVERY DAY 11/03/22   Liana Fish, NP   rOPINIRole (REQUIP) 1 MG tablet Take 1 mg by mouth at bedtime. 03/05/24   [provider]  SIMBRINZA 1-0.2 % SUSP Place 1 drop into both eyes 2 (two) times daily. 01/14/23   [provider]  TOUJEO  SOLOSTAR 300 UNIT/ML Solostar Pen Inject 15 Units into the skin in the morning. 04/02/22   [provider]  traZODone (DESYREL) 50 MG tablet Take 50 mg by mouth at bedtime as needed. 03/09/24   [provider]  TRELEGY ELLIPTA  200-62.5-25 MCG/ACT AEPB Take 1 puff by mouth daily. 01/04/24   [provider]    Physical Exam: Vitals:   04/17/24 1027 04/17/24 1101 04/17/24 1144 04/17/24 1152  BP:  105/81 (!) 112/91  (!) 100/40  Pulse:      Resp:      Temp:      TempSrc:      SpO2:      Weight:   88.7 kg   Height:   5' 10 (1.778 m)     Physical Exam   Constitutional: Alert, awake, calm, comfortable HEENT: Neck supple Respiratory: Clear to auscultation B/L, no wheezing, no rales.  Cardiovascular: Regular rate and rhythm, no murmurs / rubs / gallops. No extremity edema. 2+ pedal pulses. No carotid bruits.  Abdomen: Soft, no tenderness, Bowel sounds positive.  Musculoskeletal: no clubbing / cyanosis. Good ROM, no contractures. Normal muscle tone.  Skin: no rashes, lesions, ulcers. Neurologic: CN 2-12 grossly intact. Sensation intact, No focal deficit identified Psychiatric: Alert and oriented x 3. Normal mood.    Labs on Admission: I have personally reviewed following labs and imaging studies  CBC: Recent Labs  Lab 04/17/24 1006  WBC 9.7  NEUTROABS 6.2  HGB 11.5*  HCT 38.0*  MCV 100.3*  PLT 160   Basic Metabolic Panel: Recent Labs  Lab 04/17/24 1006  NA 134*  K 4.3  CL 89*  CO2 38*  GLUCOSE 171*  BUN 19  CREATININE 0.92  CALCIUM 9.1   GFR: Estimated Creatinine Clearance: 64.6 mL/min (by C-G formula based on SCr of 0.92 mg/dL). Liver Function Tests: Recent Labs  Lab 04/17/24 1006  AST 14*  ALT 6  ALKPHOS 73  BILITOT 0.4   PROT 6.1*  ALBUMIN 3.4*   No results for input(s): LIPASE, AMYLASE in the last 168 hours. No results for input(s): AMMONIA in the last 168 hours. Coagulation Profile: No results for input(s): INR, PROTIME in the last 168 hours. Cardiac Enzymes: No results for input(s): CKTOTAL, CKMB, CKMBINDEX, TROPONINI, TROPONINIHS in the last 168 hours. BNP (last 3 results) Recent Labs    03/21/24 2125  BNP 413.3*   HbA1C: No results for input(s): HGBA1C in the last 72 hours. CBG: No results for input(s): GLUCAP in the last 168 hours. Lipid Profile: No results for input(s): CHOL, HDL, LDLCALC, TRIG, CHOLHDL, LDLDIRECT in the last 72 hours. Thyroid Function Tests: No results for input(s): TSH, T4TOTAL, FREET4, T3FREE, THYROIDAB in the last 72 hours. Anemia Panel: No results for input(s): VITAMINB12, FOLATE, FERRITIN, TIBC, IRON, RETICCTPCT in the last 72 hours. Urine analysis:    Component Value Date/Time   COLORURINE STRAW (A) 11/05/2023 1736   APPEARANCEUR CLEAR (A) 11/05/2023 1736   APPEARANCEUR Clear 08/01/2013 1614   LABSPEC 1.012 11/05/2023 1736   LABSPEC 1.006 08/01/2013 1614   PHURINE 8.0 11/05/2023 1736   GLUCOSEU >=500 (A) 11/05/2023 1736   GLUCOSEU Negative 08/01/2013 1614   HGBUR NEGATIVE 11/05/2023 1736   BILIRUBINUR NEGATIVE 11/05/2023 1736   BILIRUBINUR Negative 08/01/2013 1614   KETONESUR 5 (A) 11/05/2023 1736   PROTEINUR NEGATIVE 11/05/2023 1736   NITRITE NEGATIVE 11/05/2023 1736   LEUKOCYTESUR NEGATIVE 11/05/2023 1736   LEUKOCYTESUR Negative 08/01/2013 1614    Radiological Exams on Admission: I have personally reviewed images DG Chest Port 1 View Result Date: 04/17/2024 CLINICAL DATA:  Weakness.  Bradycardia. EXAM: PORTABLE CHEST 1 VIEW COMPARISON:  03/21/2024 FINDINGS: The cardio pericardial silhouette is enlarged. Vascular congestion with interstitial opacity suggests edema, similar to prior. Improved  aeration in the lung bases bilaterally with some residual basilar atelectasis and probable tiny bilateral pleural effusions. Telemetry leads overlie the chest. IMPRESSION: Interval improvement in aeration in the lung bases with some residual  basilar atelectasis and probable tiny bilateral pleural effusions. Electronically Signed   By: Camellia Candle M.D.   On: 04/17/2024 11:11    EKG: My personal interpretation of EKG shows: Third-degree AV block    Assessment/Plan Principal Problem:   Complete heart block (HCC) Active Problems:   Hypothyroidism   Chronic obstructive pulmonary disease (HCC)   Stage 3a chronic kidney disease (CKD) (HCC)   Chronic systolic heart failure (HCC)   Essential hypertension   Type 2 diabetes mellitus with hyperglycemia (HCC)    Assessment and Plan: 88 year old male with history of COPD on home oxygen, CHF, HTN, DM, hypothyroidism who was brought into ED for bradycardia and hypotension.  Patient received 1 dose of atropine by EMS.  Patient takes Coreg  at home.  1.  Third-degree AV block in the setting of Coreg  use - He will be admitted to the hospital as inpatient due to complexity of his problem including diabetes, congestive heart failure, COPD and hypothyroidism. - Will hold off beta-blockers - Place him on pacer pads - Atropine at bedside - Cardiology consultation and will follow-up recommendations  2.  COPD without exacerbation - Continue oxygen to maintain saturation more than 90% - Continue DuoNebs nebulization  3.  Congestive heart failure without acute exacerbation - Resume home dose of Lasix  20 mg daily - Hold off Coreg  due to bradycardia  4.  Type 2 diabetes - He takes glipizide and Jardiance  at home. - Will hold off oral hypoglycemics and place him on insulin  sliding scale and resume his long-acting insulin  15 units at home  5.  Hypothyroidism Continue levothyroxine  137 mcg daily.     DVT prophylaxis: Lovenox  Code Status: DNR/DNI(Do  NOT Intubate) Family Communication: Family was at bedside Disposition Plan: Home Consults called: Cardiology consultation Admission status: Inpatient, Telemetry bed   Nena Rebel, MD [facility] 04/17/2024, 12:15 PM

## 2024-04-17 NOTE — ED Notes (Signed)
 MD Cleatus notified of patient's decreased BP.

## 2024-04-17 NOTE — ED Notes (Signed)
 Echo at bedside

## 2024-04-17 NOTE — ED Notes (Signed)
 This tech placed pt on the bed pan at this time. Pt is now dry and clean with a new brief/chucks placed.

## 2024-04-18 ENCOUNTER — Encounter
Admission: EM | Disposition: A | Discharge status: Home Health | Payer: Self-pay | Source: Home / Self Care | Attending: Internal Medicine

## 2024-04-18 ENCOUNTER — Encounter: Payer: Self-pay | Admitting: Cardiology

## 2024-04-18 DIAGNOSIS — I442 Atrioventricular block, complete: Secondary | ICD-10-CM | POA: Diagnosis not present

## 2024-04-18 HISTORY — PX: PACEMAKER LEADLESS INSERTION: EP1219

## 2024-04-18 HISTORY — PX: TEMPORARY PACEMAKER: CATH118268

## 2024-04-18 LAB — GLUCOSE, CAPILLARY
Glucose-Capillary: 137 mg/dL — ABNORMAL HIGH (ref 70–99)
Glucose-Capillary: 218 mg/dL — ABNORMAL HIGH (ref 70–99)
Glucose-Capillary: 150 mg/dL — ABNORMAL HIGH (ref 70–99)

## 2024-04-18 LAB — COMPREHENSIVE METABOLIC PANEL WITH GFR
ALT: 7 U/L (ref 0–44)
AST: 15 U/L (ref 15–41)
Albumin: 3.4 g/dL — ABNORMAL LOW (ref 3.5–5.0)
Alkaline Phosphatase: 78 U/L (ref 38–126)
Anion gap: 6 (ref 5–15)
BUN: 21 mg/dL (ref 8–23)
CO2: 39 mmol/L — ABNORMAL HIGH (ref 22–32)
Calcium: 8.9 mg/dL (ref 8.9–10.3)
Chloride: 89 mmol/L — ABNORMAL LOW (ref 98–111)
Creatinine, Ser: 0.86 mg/dL (ref 0.61–1.24)
GFR, Estimated: >60 mL/min (ref >60)
Glucose, Bld: 141 mg/dL — ABNORMAL HIGH (ref 70–99)
Potassium: 4.3 mmol/L (ref 3.5–5.1)
Sodium: 135 mmol/L (ref 135–145)
Total Bilirubin: 0.5 mg/dL (ref 0.0–1.2)
Total Protein: 6.2 g/dL — ABNORMAL LOW (ref 6.5–8.1)

## 2024-04-18 LAB — ECHOCARDIOGRAM COMPLETE
AR max vel: 1.17 cm2
AV Area VTI: 1.27 cm2
AV Area mean vel: 1.19 cm2
AV Mean grad: 7 mmHg
AV Peak grad: 12.9 mmHg
Ao pk vel: 1.79 m/s
Area-P 1/2: 7.09 cm2
Height: 70 in
S' Lateral: 3.8 cm
Weight: 3128.77 [oz_av]

## 2024-04-18 LAB — CBC
HCT: 41.8 % (ref 39.0–52.0)
Hemoglobin: 12.7 g/dL — ABNORMAL LOW (ref 13.0–17.0)
MCH: 30 pg (ref 26.0–34.0)
MCHC: 30.4 g/dL (ref 30.0–36.0)
MCV: 98.6 fL (ref 80.0–100.0)
Platelets: 188 K/uL (ref 150–400)
RBC: 4.24 MIL/uL (ref 4.22–5.81)
RDW: 14.7 % (ref 11.5–15.5)
WBC: 8.6 K/uL (ref 4.0–10.5)
nRBC: 0 % (ref 0.0–0.2)

## 2024-04-18 LAB — PROTIME-INR
INR: 0.9 (ref 0.8–1.2)
Prothrombin Time: 12.7 s (ref 11.4–15.2)

## 2024-04-18 SURGERY — PACEMAKER LEADLESS INSERTION
Anesthesia: Moderate Sedation

## 2024-04-18 MED ORDER — HEPARIN SODIUM (PORCINE) 1000 UNIT/ML IJ SOLN
INTRAMUSCULAR | Status: AC
  Filled 2024-04-18: qty 10

## 2024-04-18 MED ORDER — FENTANYL CITRATE (PF) 100 MCG/2ML IJ SOLN
INTRAMUSCULAR | Status: AC
  Filled 2024-04-18: qty 2

## 2024-04-18 MED ORDER — SODIUM CHLORIDE 0.9% FLUSH: 3.0000 mL | INTRAVENOUS | Status: DC | PRN

## 2024-04-18 MED ORDER — MIDAZOLAM HCL 2 MG/2ML IJ SOLN
INTRAMUSCULAR | Status: AC
  Filled 2024-04-18: qty 2

## 2024-04-18 MED ORDER — ACETAMINOPHEN 325 MG PO TABS: 650.0000 mg | ORAL_TABLET | ORAL | Status: DC | PRN

## 2024-04-18 MED ORDER — SODIUM CHLORIDE 0.9 % IV SOLN: INTRAVENOUS | Status: DC

## 2024-04-18 MED ORDER — MUPIROCIN 2 % EX OINT
1.0000 | TOPICAL_OINTMENT | Freq: Once | CUTANEOUS | Status: AC
  Administered 2024-04-18: 1 via TOPICAL
  Filled 2024-04-18 (×2): qty 22

## 2024-04-18 MED ORDER — HEPARIN SODIUM (PORCINE) 1000 UNIT/ML IJ SOLN
INTRAMUSCULAR | Status: DC | PRN
  Administered 2024-04-18: 4500 [IU] via INTRAVENOUS

## 2024-04-18 MED ORDER — FENTANYL CITRATE (PF) 100 MCG/2ML IJ SOLN
INTRAMUSCULAR | Status: DC | PRN
  Administered 2024-04-18: 12.5 ug via INTRAVENOUS

## 2024-04-18 MED ORDER — DIPHENOXYLATE-ATROPINE 2.5-0.025 MG PO TABS: 1.0000 | ORAL_TABLET | Freq: Four times a day (QID) | ORAL | Status: DC | PRN

## 2024-04-18 MED ORDER — SODIUM CHLORIDE 0.9% FLUSH
3.0000 mL | Freq: Two times a day (BID) | INTRAVENOUS | Status: DC
  Administered 2024-04-18 – 2024-04-19 (×2): 3 mL via INTRAVENOUS

## 2024-04-18 MED ORDER — LOPERAMIDE HCL 2 MG PO CAPS
2.0000 mg | ORAL_CAPSULE | ORAL | Status: DC | PRN
  Administered 2024-04-18: 2 mg via ORAL
  Filled 2024-04-18: qty 1

## 2024-04-18 MED ORDER — HEPARIN (PORCINE) IN NACL 1000-0.9 UT/500ML-% IV SOLN
INTRAVENOUS | Status: DC | PRN
  Administered 2024-04-18: 1000 mL

## 2024-04-18 MED ORDER — SODIUM CHLORIDE 0.9 % IV SOLN: 250.0000 mL | INTRAVENOUS | Status: AC | PRN

## 2024-04-18 MED ORDER — MIDAZOLAM HCL (PF) 2 MG/2ML IJ SOLN
INTRAMUSCULAR | Status: DC | PRN
  Administered 2024-04-18: .5 mg via INTRAVENOUS

## 2024-04-18 MED ORDER — LIDOCAINE HCL 1 % IJ SOLN
INTRAMUSCULAR | Status: AC
  Filled 2024-04-18: qty 60

## 2024-04-18 MED ORDER — LIDOCAINE HCL (PF) 1 % IJ SOLN
INTRAMUSCULAR | Status: DC | PRN
  Administered 2024-04-18: 5 mL
  Administered 2024-04-18: 2 mL

## 2024-04-18 MED ORDER — HEPARIN (PORCINE) IN NACL 1000-0.9 UT/500ML-% IV SOLN
INTRAVENOUS | Status: AC
  Filled 2024-04-18: qty 1000

## 2024-04-18 MED ORDER — FUROSEMIDE 40 MG PO TABS
40.0000 mg | ORAL_TABLET | Freq: Every day | ORAL | Status: DC
  Administered 2024-04-18 – 2024-04-19 (×2): 40 mg via ORAL
  Filled 2024-04-18 (×2): qty 1

## 2024-04-18 SURGICAL SUPPLY — 19 items
CABLE ADAPT PACING TEMP 12FT (ADAPTER) IMPLANT
DILATOR VESSEL 38 20CM 12FR (INTRODUCER) IMPLANT
DILATOR VESSEL 38 20CM 14FR (INTRODUCER) IMPLANT
DILATOR VESSEL 38 20CM 18FR (INTRODUCER) IMPLANT
DILATOR VESSEL 38 20CM 8FR (INTRODUCER) IMPLANT
DRAPE BRACHIAL (DRAPES) IMPLANT
KIT SYRINGE INJ CVI SPIKEX1 (MISCELLANEOUS) IMPLANT
NDL PERC 18GX7CM (NEEDLE) IMPLANT
NEEDLE PERC 18GX7CM (NEEDLE) ×2 IMPLANT
PACEMAKER LEADLESS AV2 MICRA (Pacemaker) IMPLANT
PACK CARDIAC CATH (CUSTOM PROCEDURE TRAY) ×1 IMPLANT
PAD ELECT DEFIB RADIOL ZOLL (MISCELLANEOUS) IMPLANT
SHEATH AVANTI 6FR X 11CM (SHEATH) IMPLANT
SHEATH AVANTI 7FRX11 (SHEATH) IMPLANT
SHEATH INTRODUCER MICRA (SHEATH) IMPLANT
STATION PROTECTION PRESSURIZED (MISCELLANEOUS) IMPLANT
SUT SILK 0 FSL (SUTURE) IMPLANT
WIRE AMPLATZ SS-J .035X180CM (WIRE) IMPLANT
WIRE PACING TEMP ST TIP 5 (CATHETERS) IMPLANT

## 2024-04-18 NOTE — Progress Notes (Signed)
 [facility] CLINIC CARDIOLOGY PROGRESS NOTE       Patient ID: [name] MRN: [id number] DOB/AGE: 09/21/1935 88 y.o.  Admit date: 04/17/2024 Referring Physician Dr. Artist Kerns Primary Physician Schmerge, Rosaline NOVAK, NP  Primary Cardiologist Dr. Hester (last seen 2023) Reason for Consultation symptomatic bradycardia  HPI: [name] is a 88 y.o. male  with a past medical history of COPD, diastolic CHF, GERD, type 2 diabetes mellitus, hypertension, dyslipidemia and hypothyroidism who presented to the ED on 04/17/2024 for weakness and SOB. Found to be significantly bradycardia and EMS gave atropine. Cardiology was consulted for further evaluation.   Interval history: - Patient seen and examined this morning following Micra placement. - States he is feeling well overall, denies any significant dizziness or lightheadedness. - Heart rate in the 70s on telemetry, device appears to be functioning appropriately.  Review of systems complete and found to be negative unless listed above    Past Medical History:  Diagnosis Date   Arthritis    Asthma    CHF (congestive heart failure) (HCC)    COPD (chronic obstructive pulmonary disease) (HCC)    Diabetes mellitus without complication (HCC)    GERD (gastroesophageal reflux disease)    H/O Graves' disease    H/O wheezing    HOH (hard of hearing)    Hypercholesterolemia    Hypertension    Hypothyroidism    Lower extremity edema    Multiple allergies    Palpitations    Peripheral vascular disease    swelling of feet and legs   Pneumonia    in the past   PONV (postoperative nausea and vomiting)    Shortness of breath dyspnea    Sleep apnea    CPAP    Past Surgical History:  Procedure Laterality Date   CARDIAC CATHETERIZATION     CATARACT EXTRACTION W/PHACO Right 09/01/2015   Procedure: CATARACT EXTRACTION PHACO AND INTRAOCULAR LENS PLACEMENT (IOC);  Surgeon: Steven Dingeldein, MD;  Location: [facility] ORS;  Service:  Ophthalmology;  Laterality: Right;  US  01:33 AP% 24.9 CDE 42.99 fluid pack lot # [id number]   CATARACT EXTRACTION W/PHACO Left 09/22/2015   Procedure: CATARACT EXTRACTION PHACO AND INTRAOCULAR LENS PLACEMENT (IOC);  Surgeon: Steven Dingeldein, MD;  Location: [facility] ORS;  Service: Ophthalmology;  Laterality: Left;  US  01:28 AP% 20.9 CDE 29.54 fluid pack lot # [id number]   CHOLECYSTECTOMY     EYE SURGERY     Orbital Decompression     TONSILLECTOMY      Medications Prior to Admission  Medication Sig Dispense Refill Last Dose/Taking   acetaminophen  (TYLENOL ) 500 MG tablet Take 500 mg by mouth every 6 (six) hours as needed for mild pain (pain score 1-3).   Taking As Needed   aspirin  EC 81 MG tablet Take 81 mg by mouth daily.   04/16/2024   benzonatate  (TESSALON ) 100 MG capsule Take 100 mg by mouth 3 (three) times daily as needed.   Taking As Needed   carvedilol  (COREG ) 25 MG tablet Take 25 mg by mouth 2 (two) times daily with a meal.   04/17/2024   cholecalciferol (VITAMIN D) 1000 units tablet Take 1,000 Units by mouth at bedtime.    04/16/2024   furosemide  (LASIX ) 20 MG tablet Take 2 tablets (40 mg total) by mouth daily. 30 tablet 0 04/17/2024   gabapentin  (NEURONTIN ) 800 MG tablet Take 800 mg by mouth every 8 (eight) hours.   04/17/2024   glipiZIDE (GLUCOTROL) 10 MG tablet Take 10 mg by  mouth every morning.   04/17/2024   ipratropium-albuterol  (DUONEB) 0.5-2.5 (3) MG/3ML SOLN Take 3 mLs by nebulization every 4 (four) hours as needed. [id number] 360 mL 3 Taking As Needed   JARDIANCE  25 MG TABS tablet Take 25 mg by mouth daily.   04/16/2024   levothyroxine  (SYNTHROID , LEVOTHROID) 137 MCG tablet Take 137 mcg by mouth daily before breakfast.   04/17/2024   liothyronine  (CYTOMEL ) 5 MCG tablet Take 5 mcg by mouth daily.   04/16/2024   LUMIGAN 0.01 % SOLN Place 1 drop into both eyes at bedtime.   04/16/2024   magnesium  oxide (MAG-OX) 400 MG tablet Take 400 mg by mouth daily.   04/16/2024   montelukast   (SINGULAIR ) 10 MG tablet Take 10 mg by mouth daily.   04/16/2024   Potassium Chloride ER 20 MEQ TBCR Take 1 tablet by mouth daily.   04/16/2024   pravastatin  (PRAVACHOL ) 20 MG tablet Take 20 mg by mouth at bedtime.   04/16/2024   RHOPRESSA 0.02 % SOLN Place 1 drop into the right eye daily.   04/16/2024   Roflumilast  250 MCG TABS TAKE 1 TABLET BY MOUTH EVERY DAY 28 tablet 0 04/16/2024   rOPINIRole (REQUIP) 1 MG tablet Take 1 mg by mouth at bedtime.   04/16/2024   SIMBRINZA 1-0.2 % SUSP Place 1 drop into both eyes 2 (two) times daily.   04/16/2024   TOUJEO  SOLOSTAR 300 UNIT/ML Solostar Pen Inject 15 Units into the skin in the morning.   04/16/2024   traZODone (DESYREL) 50 MG tablet Take 50 mg by mouth at bedtime as needed.   Taking As Needed   TRELEGY ELLIPTA  200-62.5-25 MCG/ACT AEPB Take 1 puff by mouth daily.   04/16/2024   albuterol  (VENTOLIN  HFA) 108 (90 Base) MCG/ACT inhaler Inhale 2 puffs into the lungs every 6 (six) hours as needed for wheezing or shortness of breath. (Patient not taking: Reported on 04/17/2024) 8 g 5 Not Taking   B-D ULTRAFINE III SHORT PEN 31G X 8 MM MISC Inject into the skin daily.      mupirocin ointment (BACTROBAN) 2 % Apply 1 Application topically 2 (two) times daily. (Patient not taking: Reported on 04/17/2024)   Not Taking   ONETOUCH ULTRA test strip USE AS DIRECTED TO CHECK BLOOD GLUCOSE EVERY DAY      OXYGEN Inhale into the lungs. 2 litre at night      Social History   Socioeconomic History   Marital status: Married    Spouse name: Not on file   Number of children: Not on file   Years of education: Not on file   Highest education level: Not on file  Occupational History   Not on file  Tobacco Use   Smoking status: Former   Smokeless tobacco: Never  Substance and Sexual Activity   Alcohol use: Yes    Comment: couple glasses of wine daily   Drug use: No   Sexual activity: Not on file  Other Topics Concern   Not on file  Social History Narrative   Not  on file   Social Drivers of Health   Financial Resource Strain: Not on file  Food Insecurity: No Food Insecurity (03/23/2024)   Hunger Vital Sign    Worried About Running Out of Food in the Last Year: Never true    Ran Out of Food in the Last Year: Never true  Transportation Needs: No Transportation Needs (03/23/2024)   PRAPARE - Administrator, Civil Service (Medical):  No    Lack of Transportation (Non-Medical): No  Physical Activity: Not on file  Stress: Not on file  Social Connections: Unknown (03/23/2024)   Social Connection and Isolation Panel    Frequency of Communication with Friends and Family: Patient declined    Frequency of Social Gatherings with Friends and Family: Not on file    Attends Religious Services: Not on file    Active Member of Clubs or Organizations: Not on file    Attends Banker Meetings: Not on file    Marital Status: Not on file  Intimate Partner Violence: Not At Risk (03/23/2024)   Humiliation, Afraid, Rape, and Kick questionnaire    Fear of Current or Ex-Partner: No    Emotionally Abused: No    Physically Abused: No    Sexually Abused: No    Family History  Problem Relation Age of Onset   Diabetes Father      Vitals:   04/18/24 0945 04/18/24 1000 04/18/24 1015 04/18/24 1030  BP: (!) 141/66 (!) 141/63 (!) 144/92 (!) 148/69  Pulse: 67 70 69 69  Resp: 16 14 14 16   Temp:      TempSrc:      SpO2: 90% 94% 99% 100%  Weight:      Height:        PHYSICAL EXAM General: Chronically ill-appearing elderly male, well nourished, in no acute distress. HEENT: Normocephalic and atraumatic. Neck: No JVD.  Lungs: Normal respiratory effort on 4L [place]. Clear bilaterally to auscultation. No wheezes, crackles, rhonchi.  Heart: HRRR. Normal S1 and S2 without gallops or murmurs.  Abdomen: Non-distended appearing.  Msk: Normal strength and tone for age. Extremities: Warm and well perfused. No clubbing, cyanosis.  No edema.  Neuro: Alert  and oriented X 3. Psych: Answers questions appropriately.   Labs: Basic Metabolic Panel: Recent Labs    04/17/24 1006  NA 134*  K 4.3  CL 89*  CO2 38*  GLUCOSE 171*  BUN 19  CREATININE 0.92  CALCIUM 9.1   Liver Function Tests: Recent Labs    04/17/24 1006  AST 14*  ALT 6  ALKPHOS 73  BILITOT 0.4  PROT 6.1*  ALBUMIN 3.4*   No results for input(s): LIPASE, AMYLASE in the last 72 hours. CBC: Recent Labs    04/17/24 1006  WBC 9.7  NEUTROABS 6.2  HGB 11.5*  HCT 38.0*  MCV 100.3*  PLT 160   Cardiac Enzymes: No results for input(s): CKTOTAL, CKMB, CKMBINDEX, TROPONINIHS in the last 72 hours. BNP: No results for input(s): BNP in the last 72 hours. D-Dimer: No results for input(s): DDIMER in the last 72 hours. Hemoglobin A1C: No results for input(s): HGBA1C in the last 72 hours. Fasting Lipid Panel: No results for input(s): CHOL, HDL, LDLCALC, TRIG, CHOLHDL, LDLDIRECT in the last 72 hours. Thyroid Function Tests: No results for input(s): TSH, T4TOTAL, T3FREE, THYROIDAB in the last 72 hours.  Invalid input(s): FREET3 Anemia Panel: No results for input(s): VITAMINB12, FOLATE, FERRITIN, TIBC, IRON, RETICCTPCT in the last 72 hours.   Radiology: EP PPM/ICD IMPLANT Result Date: 04/18/2024 Successful implantation of Medtronic Micra AV 2 leadless pacemaker   CARDIAC CATHETERIZATION Result Date: 04/18/2024 Successful implantation of Medtronic Micra AV 2 leadless pacemaker   DG Chest Port 1 View Result Date: 04/17/2024 CLINICAL DATA:  Weakness.  Bradycardia. EXAM: PORTABLE CHEST 1 VIEW COMPARISON:  03/21/2024 FINDINGS: The cardio pericardial silhouette is enlarged. Vascular congestion with interstitial opacity suggests edema, similar to prior. Improved aeration in the  lung bases bilaterally with some residual basilar atelectasis and probable tiny bilateral pleural effusions. Telemetry leads overlie the chest.  IMPRESSION: Interval improvement in aeration in the lung bases with some residual basilar atelectasis and probable tiny bilateral pleural effusions. Electronically Signed   By: Camellia Candle M.D.   On: 04/17/2024 11:11   DG Chest Portable 1 View Result Date: 03/21/2024 EXAM: 1 VIEW XRAY OF THE CHEST 03/21/2024 09:35:45 PM COMPARISON: 05/22/2022 CLINICAL HISTORY: Short of breath. c/o shortness of breath. Pt with hx of COPD, on 3L [place] at baseline, found with O2 sat 80% with significant work of breathing FINDINGS: LUNGS AND PLEURA: Central pulmonary vascular congestion. Interstitial opacity scattered throughout the mid and lower lungs. Small bilateral pleural effusions. Consolidation in the left lung base. No pneumothorax. HEART AND MEDIASTINUM: The heart is enlarged. BONES AND SOFT TISSUES: No acute osseous abnormality. IMPRESSION: 1. Small bilateral pleural effusions 2. Left basilar pneumonia 3. Cardiomegaly 4. Pulmonary edema pattern Electronically signed by: Greig Pique MD 03/21/2024 09:39 PM EDT RP Workstation: [id number]    ECHO ordered  TELEMETRY (personally reviewed): Complete heart block rate 20s  EKG (personally reviewed): Complete heart block rate 20 bpm  Data reviewed by me 04/18/2024: last 24h vitals tele labs imaging I/O ED provider note, admission H&P  Principal Problem:   Complete heart block (HCC) Active Problems:   Hypothyroidism   Chronic obstructive pulmonary disease (HCC)   Stage 3a chronic kidney disease (CKD) (HCC)   Chronic systolic heart failure (HCC)   Essential hypertension   Type 2 diabetes mellitus with hyperglycemia (HCC)    ASSESSMENT AND PLAN:  [name] is a 88 y.o. male  with a past medical history of COPD, diastolic CHF, GERD, type 2 diabetes mellitus, hypertension, dyslipidemia and hypothyroidism who presented to the ED on 04/17/2024 for weakness and SOB. Found to be significantly bradycardia and EMS gave atropine. Cardiology was consulted for further  evaluation.   # Complete heart block # S/p Micra AV 2 leadless pacemaker 04/18/2024 # Coronary artery disease # Hypertension Patient presented in complete heart block with heart rates in the 20s.  Home Coreg  has been held.  Troponins mildly elevated and flat.  BNP elevated but he is without significant change in his chronic shortness of breath and is on his home O2 requirement.  S/p Micra placement 04/18/2024. - Echo pending review. - Will plan to remove right groin suture tomorrow morning.  If he does well with recovery today through tomorrow morning can likely be discharged home tomorrow. - Minimal and flat troponin most consistent with demand/supply mismatch and not ACS. - Continue home pravastatin  20 mg daily, aspirin  81 mg daily. - S/p IV Lasix  40 mg once yesterday.  Will resume home Lasix  40 mg daily.   This patient's plan of care was discussed and created with Dr. Ammon and he is in agreement.  Signed: Danita Bloch, PA-C  04/18/2024, 10:58 AM [facility]

## 2024-04-18 NOTE — Progress Notes (Signed)
 PROGRESS NOTE    [name]  FMW:[id number] DOB: March 11, 1936 DOA: 04/17/2024 PCP: Ileen Rosaline NOVAK, NP    Brief Narrative:  [name] is a pleasant 88 y.o. male with medical history significant for COPD, HFpEF, GERD, DM, HTN, HLD, hypothyroidism who presented to ED on 04/17/2024 for generalized weakness and low heart heartbeat at home.  Patient stated that he checks his blood pressure and heart rate every morning.  While checking he found that his heart rate was in 30s and blood pressure was low and he called ambulance.  EMS was called and found to have heart rate was 26, blood pressure was 110/60 and decreased to 80/50.  Patient was alert awake and oriented x 4.  EMS administered atropine.  Patient comes with a DNR form, history of CHF, COPD wears 4 L home oxygen at home via nasal cannula.  Patient denies any fever, cough, palpitations, nausea vomiting diarrhea.  However, he complains of mild worsening shortness of breath and ankle edema.    Assessment & Plan:   Principal Problem:   Complete heart block (HCC) Active Problems:   Hypothyroidism   Chronic obstructive pulmonary disease (HCC)   Stage 3a chronic kidney disease (CKD) (HCC)   Chronic systolic heart failure (HCC)   Essential hypertension   Type 2 diabetes mellitus with hyperglycemia (HCC)  88 year old male with history of COPD on home oxygen, CHF, HTN, DM, hypothyroidism who was brought into ED for bradycardia and hypotension.  Patient received 1 dose of atropine by EMS.  Patient takes Coreg  at home.   1.  Complete heart block Patient underwent pacemaker implantation with interventional cardiology on 11/12.  Tolerated procedure well.  Postprocedure heart rate is in the 70s.  Paced rhythm. Plan: Hold home Coreg .  Check 2D echocardiogram.  Cardiology following and will remove right groin suture tomorrow morning 11/13.  If patient continues to recover well can likely be discharged home tomorrow.   2.  COPD without  exacerbation - Continue oxygen to maintain saturation more than 90% - Continue DuoNebs nebulization   3.  Congestive heart failure without acute exacerbation - Resume home dose of Lasix  40 mg daily - Hold Coreg    4.  Type 2 diabetes - He takes glipizide and Jardiance  at home. - Hold orals -Lantus  and SSI   5.  Hypothyroidism Continue levothyroxine  137 mcg daily.      DVT prophylaxis: SQ Lovenox  Code Status: DNR Family Communication: None today Disposition Plan: Status is: Inpatient Remains inpatient appropriate because: Complete heart block status post PPM implantation.  If patient remains stable anticipate discharge 11/13.   Level of care: Progressive  Consultants:  Cardiology  Procedures:  PPM implantation  Antimicrobials: None   Subjective: Seen and examined.  Resting in bed.  Reports no complaints.  Objective: Vitals:   04/18/24 1030 04/18/24 1100 04/18/24 1130 04/18/24 1200  BP: (!) 148/69 (!) 150/62 (!) 147/64 (!) 145/71  Pulse: 69 64 68 71  Resp: 16 18 13 19   Temp:      TempSrc:      SpO2: 100% 100% 99% 95%  Weight:      Height:        Intake/Output Summary (Last 24 hours) at 04/18/2024 1426 Last data filed at 04/18/2024 1130 Gross per 24 hour  Intake --  Output 800 ml  Net -800 ml   Filed Weights   04/17/24 1144  Weight: 88.7 kg    Examination:  General exam: Appears calm and comfortable  Respiratory  system: Clear to auscultation. Respiratory effort normal. Cardiovascular system: S1-S2, RRR, no murmurs, no pedal edema Gastrointestinal system: Soft, T/ND, normal bowel sounds Central nervous system: Alert and oriented. No focal neurological deficits. Extremities: Symmetric 5 x 5 power. Skin: No rashes, lesions or ulcers Psychiatry: Judgement and insight appear normal. Mood & affect appropriate.     Data Reviewed: I have personally reviewed following labs and imaging studies  CBC: Recent Labs  Lab 04/17/24 1006  WBC 9.7   NEUTROABS 6.2  HGB 11.5*  HCT 38.0*  MCV 100.3*  PLT 160   Basic Metabolic Panel: Recent Labs  Lab 04/17/24 1006  NA 134*  K 4.3  CL 89*  CO2 38*  GLUCOSE 171*  BUN 19  CREATININE 0.92  CALCIUM 9.1   GFR: Estimated Creatinine Clearance: 64.6 mL/min (by C-G formula based on SCr of 0.92 mg/dL). Liver Function Tests: Recent Labs  Lab 04/17/24 1006  AST 14*  ALT 6  ALKPHOS 73  BILITOT 0.4  PROT 6.1*  ALBUMIN 3.4*   No results for input(s): LIPASE, AMYLASE in the last 168 hours. No results for input(s): AMMONIA in the last 168 hours. Coagulation Profile: No results for input(s): INR, PROTIME in the last 168 hours. Cardiac Enzymes: No results for input(s): CKTOTAL, CKMB, CKMBINDEX, TROPONINI in the last 168 hours. BNP (last 3 results) Recent Labs    04/17/24 1006  PROBNP 2,561.0*   HbA1C: No results for input(s): HGBA1C in the last 72 hours. CBG: Recent Labs  Lab 04/17/24 1650 04/17/24 2129 04/18/24 0725  GLUCAP 256* 184* 137*   Lipid Profile: No results for input(s): CHOL, HDL, LDLCALC, TRIG, CHOLHDL, LDLDIRECT in the last 72 hours. Thyroid Function Tests: No results for input(s): TSH, T4TOTAL, FREET4, T3FREE, THYROIDAB in the last 72 hours. Anemia Panel: No results for input(s): VITAMINB12, FOLATE, FERRITIN, TIBC, IRON, RETICCTPCT in the last 72 hours. Sepsis Labs: No results for input(s): PROCALCITON, LATICACIDVEN in the last 168 hours.  No results found for this or any previous visit (from the past 240 hours).       Radiology Studies: ECHOCARDIOGRAM COMPLETE Result Date: 04/18/2024    ECHOCARDIOGRAM REPORT   Patient Name:   [name] Date of Exam: 04/17/2024 Medical Rec #:  [id number]     Height:       70.0 in Accession #:    [id number]    Weight:       195.5 lb Date of Birth:  Oct 24, 1935    BSA:          2.067 m Patient Age:    88 years      BP:           110/42 mmHg Patient Gender:  M             HR:           27 bpm. Exam Location:  [facility] Procedure: 2D Echo, Cardiac Doppler and Color Doppler (Both Spectral and Color            Flow Doppler were utilized during procedure). Indications:     CHF-acute diastolic I50.31  History:         Patient has prior history of Echocardiogram examinations, most                  recent 05/22/2022. CHF, COPD; Risk Factors:Hypertension and                  Sleep Apnea.  Sonographer:  Christopher Furnace Referring Phys:  [id number] CARALYN HUDSON Diagnosing Phys: Marsa Dooms MD IMPRESSIONS  1. Left ventricular ejection fraction, by estimation, is 60 to 65%. The left ventricle has normal function. The left ventricle has no regional wall motion abnormalities. Indeterminate diastolic filling due to E-A fusion.  2. Right ventricular systolic function is normal. The right ventricular size is normal.  3. The mitral valve is normal in structure. No evidence of mitral valve regurgitation. No evidence of mitral stenosis.  4. The aortic valve is normal in structure. Aortic valve regurgitation is not visualized. Mild aortic valve stenosis.  5. The inferior vena cava is normal in size with greater than 50% respiratory variability, suggesting right atrial pressure of 3 mmHg. FINDINGS  Left Ventricle: Left ventricular ejection fraction, by estimation, is 60 to 65%. The left ventricle has normal function. The left ventricle has no regional wall motion abnormalities. Strain was performed and the global longitudinal strain is indeterminate. The left ventricular internal cavity size was normal in size. There is no left ventricular hypertrophy. Indeterminate diastolic filling due to E-A fusion. Right Ventricle: The right ventricular size is normal. No increase in right ventricular wall thickness. Right ventricular systolic function is normal. Left Atrium: Left atrial size was normal in size. Right Atrium: Right atrial size was normal in size. Pericardium: There is no evidence of  pericardial effusion. Mitral Valve: The mitral valve is normal in structure. No evidence of mitral valve regurgitation. No evidence of mitral valve stenosis. Tricuspid Valve: The tricuspid valve is normal in structure. Tricuspid valve regurgitation is not demonstrated. No evidence of tricuspid stenosis. Aortic Valve: The aortic valve is normal in structure. Aortic valve regurgitation is not visualized. Mild aortic stenosis is present. Aortic valve mean gradient measures 7.0 mmHg. Aortic valve peak gradient measures 12.9 mmHg. Aortic valve area, by VTI measures 1.27 cm. Pulmonic Valve: The pulmonic valve was normal in structure. Pulmonic valve regurgitation is not visualized. No evidence of pulmonic stenosis. Aorta: The aortic root is normal in size and structure. Venous: The inferior vena cava is normal in size with greater than 50% respiratory variability, suggesting right atrial pressure of 3 mmHg. IAS/Shunts: No atrial level shunt detected by color flow Doppler. Additional Comments: 3D was performed not requiring image post processing on an independent workstation and was indeterminate.  LEFT VENTRICLE PLAX 2D LVIDd:         5.30 cm   Diastology LVIDs:         3.80 cm   LV e' medial:    11.10 cm/s LV PW:         1.50 cm   LV E/e' medial:  8.5 LV IVS:        1.40 cm   LV e' lateral:   12.00 cm/s LVOT diam:     2.30 cm   LV E/e' lateral: 7.8 LV SV:         61 LV SV Index:   30 LVOT Area:     4.15 cm LV IVRT:       92 msec  RIGHT VENTRICLE RV Basal diam:  3.80 cm RV Mid diam:    3.00 cm RV S prime:     11.40 cm/s TAPSE (M-mode): 1.4 cm LEFT ATRIUM             Index        RIGHT ATRIUM           Index LA diam:        3.30 cm  1.60 cm/m   RA Area:     23.70 cm LA Vol (A2C):   53.2 ml 25.73 ml/m  RA Volume:   73.50 ml  35.55 ml/m LA Vol (A4C):   45.3 ml 21.91 ml/m LA Biplane Vol: 53.4 ml 25.83 ml/m  AORTIC VALVE AV Area (Vmax):    1.17 cm AV Area (Vmean):   1.19 cm AV Area (VTI):     1.27 cm AV Vmax:            179.33 cm/s AV Vmean:          118.333 cm/s AV VTI:            0.484 m AV Peak Grad:      12.9 mmHg AV Mean Grad:      7.0 mmHg LVOT Vmax:         50.30 cm/s LVOT Vmean:        34.000 cm/s LVOT VTI:          0.148 m LVOT/AV VTI ratio: 0.31  AORTA Ao Root diam: 3.50 cm MITRAL VALVE               TRICUSPID VALVE MV Area (PHT): 7.09 cm    TR Peak grad:   7.1 mmHg MV Decel Time: 107 msec    TR Vmax:        133.00 cm/s MV E velocity: 93.80 cm/s                            SHUNTS                            Systemic VTI:  0.15 m                            Systemic Diam: 2.30 cm Marsa Dooms MD Electronically signed by Marsa Dooms MD Signature Date/Time: 04/18/2024/11:07:34 AM    Final    EP PPM/ICD IMPLANT Result Date: 04/18/2024 Successful implantation of Medtronic Micra AV 2 leadless pacemaker   CARDIAC CATHETERIZATION Result Date: 04/18/2024 Successful implantation of Medtronic Micra AV 2 leadless pacemaker   DG Chest Port 1 View Result Date: 04/17/2024 CLINICAL DATA:  Weakness.  Bradycardia. EXAM: PORTABLE CHEST 1 VIEW COMPARISON:  03/21/2024 FINDINGS: The cardio pericardial silhouette is enlarged. Vascular congestion with interstitial opacity suggests edema, similar to prior. Improved aeration in the lung bases bilaterally with some residual basilar atelectasis and probable tiny bilateral pleural effusions. Telemetry leads overlie the chest. IMPRESSION: Interval improvement in aeration in the lung bases with some residual basilar atelectasis and probable tiny bilateral pleural effusions. Electronically Signed   By: Camellia Candle M.D.   On: 04/17/2024 11:11        Scheduled Meds:  aspirin  EC  81 mg Oral Daily   enoxaparin  (LOVENOX ) injection  40 mg Subcutaneous Q24H   furosemide   40 mg Oral Daily   gabapentin   800 mg Oral Q8H   insulin  aspart  0-5 Units Subcutaneous QHS   insulin  aspart  0-9 Units Subcutaneous TID WC   insulin  glargine-yfgn  15 Units Subcutaneous Daily    levothyroxine   137 mcg Oral QAC breakfast   potassium chloride SA  20 mEq Oral Daily   pravastatin   20 mg Oral QHS   sodium chloride  flush  3 mL Intravenous Q12H   Continuous Infusions:  sodium chloride   LOS: 1 day     Aaron KATHEE Robson, MD [facility]   If 7PM-7AM, please contact night-coverage  04/18/2024, 2:26 PM

## 2024-04-18 NOTE — ED Notes (Signed)
 Fall risk bundle in place.

## 2024-04-18 NOTE — TOC Initial Note (Signed)
 Transition of Care [facility]) - Initial/Assessment Note    Patient Details  Name: [name] MRN: [id number] Date of Birth: 17-Apr-1936  [facility] CM/SW Contact:    Lauraine JAYSON Carpen, LCSW Phone Number: 04/18/2024, 1:16 PM  Clinical Narrative:   This CSW completed readmission prevention screen on 10/17:  Readmission prevention screen complete. CSW met with patient. Wife at bedside. CSW introduced role and explained that discharge planning would be discussed. PCP is Rosaline Hoots, NP with [facility]. PCP office is aware that he is here. CSW left them a message to update them once discharge order was in. Wife drives patient to appointments. Pharmacy is [facility] in [place]. No issues affording medications. Patient lives home with his wife and cat. He is active with [facility] for PT. They can add OT and RN. He has a rollator, BSC, and shower chair at home. He confirmed he has home oxygen. Per wife, it's through [facility]. No further concerns. Wife will transport him home.  CSW called [facility] and confirmed patient is still active with PT, RN.               Expected Discharge Plan: Home w Home Health Services Barriers to Discharge: Continued Medical Work up   Patient Goals and CMS Choice            Expected Discharge Plan and Services     Post Acute Care Choice: Resumption of Svcs/PTA Provider Living arrangements for the past 2 months: Single Family Home                           HH Arranged: RN, PT [facility] Agency: [facility] Date [facility] Agency Contacted: 04/18/24   Representative spoke with at [facility] Agency: Rexene  Prior Living Arrangements/Services Living arrangements for the past 2 months: Single Family Home Lives with:: Spouse Patient language and need for interpreter reviewed:: Yes        Need for Family Participation in Patient Care: Yes (Comment) Care giver support system in place?: Yes (comment) Current home services: DME, Home  PT, Home RN Criminal Activity/Legal Involvement Pertinent to Current Situation/Hospitalization: No - Comment as needed  Activities of Daily Living      Permission Sought/Granted                  Emotional Assessment       Orientation: : Oriented to Self, Oriented to Place, Oriented to  Time, Oriented to Situation Alcohol / Substance Use: Not Applicable Psych Involvement: No (comment)  Admission diagnosis:  Complete heart block (HCC) [I44.2] Symptomatic bradycardia [R00.1] Complete heart block by electrocardiogram [facility]) [I44.2] Patient Active Problem List   Diagnosis Date Noted   Complete heart block (HCC) 04/17/2024   Acute on chronic congestive heart failure (HCC) 03/23/2024   COPD exacerbation (HCC) 03/23/2024   Pressure injury of skin 03/23/2024   Acute respiratory failure with hypoxia and hypercarbia (HCC) 03/22/2024   Community acquired pneumonia of left lower lobe of lung 03/22/2024   Uncontrolled type 2 diabetes mellitus with hyperglycemia, with long-term current use of insulin  (HCC) 03/22/2024   Pulmonary hypertension (HCC) 03/22/2024   Dyslipidemia 03/22/2024   Diabetic neuropathy (HCC) 03/22/2024   Acute hypoxemic respiratory failure (HCC) 03/22/2024   Controlled type 2 diabetes mellitus without complication (HCC) 04/27/2023   Long term (current) use of insulin  (HCC) 04/27/2023   COPD with acute exacerbation (HCC) 05/23/2022   Essential hypertension  05/23/2022   Type 2 diabetes mellitus with hyperglycemia (HCC) 05/23/2022   Scalp laceration 05/23/2022   Graves' ophthalmopathy 05/23/2022   Hyperkalemia 05/23/2022   Acute on chronic diastolic CHF (congestive heart failure) (HCC) 05/21/2022   Mild aortic valve stenosis 12/15/2021   SOBOE (shortness of breath on exertion) 11/19/2021   Pain in joint of left elbow 12/15/2020   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   Tendinitis of left elbow 12/15/2020   Chronic respiratory failure with hypoxia (HCC) 12/13/2020    Chronic obstructive pulmonary disease (HCC) 12/13/2020   Stage 3a chronic kidney disease (CKD) (HCC) 12/24/2019   Chronic systolic heart failure (HCC) 12/24/2019   Hyponatremia 12/24/2019   Type 2 diabetes mellitus with diabetic nephropathy (HCC) 12/24/2019   HYPONATREMIA, CHRONIC 03/12/2007   Hereditary and idiopathic peripheral neuropathy 03/12/2007   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 [place] [address] Phone: [phone] Fax: [phone]  [facility] [place], [place] - [address] [place] [address] Phone: [phone] Fax: [phone]  [facility] [address] [place] [address] Phone: [phone] Fax: [phone]     Social Drivers of Health (SDOH) Social History: SDOH Screenings   Food Insecurity: No Food Insecurity (03/23/2024)  Housing: Low Risk  (03/23/2024)  Transportation Needs: No Transportation Needs (03/23/2024)  Utilities: Not At Risk (03/23/2024)  Alcohol Screen: Low Risk  (01/03/2018)  Depression (PHQ2-9): Low Risk  (02/05/2020)  Social Connections: Unknown (03/23/2024)  Tobacco Use: Medium Risk (04/17/2024)   SDOH Interventions:     Readmission Risk Interventions    03/23/2024   12:40 PM 05/24/2022   10:29 AM  Readmission Risk Prevention Plan  Transportation Screening Complete Complete  PCP or Specialist Appt within 3-5 Days Complete Complete  HRI or Home Care Consult Complete Complete  Social Work Consult for Recovery Care Planning/Counseling Complete --  Palliative Care Screening Not Applicable Not Applicable  Medication Review Furniture Conservator/restorer) Complete Complete

## 2024-04-18 NOTE — ED Notes (Signed)
 MD Cleatus states to notify MD if patient's BP goes lower, atropine will be considered.

## 2024-04-18 NOTE — Progress Notes (Signed)
 Assumed care of patient from Kline CN/RN. Pt assessed. Agree with previous assessment. No changes.

## 2024-04-19 ENCOUNTER — Other Ambulatory Visit: Payer: Self-pay

## 2024-04-19 DIAGNOSIS — I442 Atrioventricular block, complete: Secondary | ICD-10-CM

## 2024-04-19 DIAGNOSIS — E1165 Type 2 diabetes mellitus with hyperglycemia: Secondary | ICD-10-CM

## 2024-04-19 DIAGNOSIS — I5022 Chronic systolic (congestive) heart failure: Secondary | ICD-10-CM

## 2024-04-19 LAB — BASIC METABOLIC PANEL WITH GFR
Anion gap: 6 (ref 5–15)
BUN: 14 mg/dL (ref 8–23)
CO2: 38 mmol/L — ABNORMAL HIGH (ref 22–32)
Calcium: 8.7 mg/dL — ABNORMAL LOW (ref 8.9–10.3)
Chloride: 94 mmol/L — ABNORMAL LOW (ref 98–111)
Creatinine, Ser: 0.71 mg/dL (ref 0.61–1.24)
GFR, Estimated: >60 mL/min (ref >60)
Glucose, Bld: 91 mg/dL (ref 70–99)
Potassium: 4.5 mmol/L (ref 3.5–5.1)
Sodium: 137 mmol/L (ref 135–145)

## 2024-04-19 LAB — URINALYSIS, COMPLETE (UACMP) WITH MICROSCOPIC
Bacteria, UA: NONE SEEN
Bilirubin Urine: NEGATIVE
Glucose, UA: >=500 mg/dL — AB
Hgb urine dipstick: NEGATIVE
Ketones, ur: NEGATIVE mg/dL
Leukocytes,Ua: NEGATIVE
Nitrite: NEGATIVE
Protein, ur: NEGATIVE mg/dL
Specific Gravity, Urine: 1.016 (ref 1.005–1.030)
Squamous Epithelial / HPF: 0 /HPF (ref 0–5)
pH: 8 (ref 5.0–8.0)

## 2024-04-19 LAB — CBC WITH DIFFERENTIAL/PLATELET
Abs Immature Granulocytes: 0.04 K/uL (ref 0.00–0.07)
Basophils Absolute: 0 K/uL (ref 0.0–0.1)
Basophils Relative: 0 %
Eosinophils Absolute: 0.1 K/uL (ref 0.0–0.5)
Eosinophils Relative: 1 %
HCT: 40.2 % (ref 39.0–52.0)
Hemoglobin: 12.1 g/dL — ABNORMAL LOW (ref 13.0–17.0)
Immature Granulocytes: 0 %
Lymphocytes Relative: 16 %
Lymphs Abs: 1.5 K/uL (ref 0.7–4.0)
MCH: 30 pg (ref 26.0–34.0)
MCHC: 30.1 g/dL (ref 30.0–36.0)
MCV: 99.8 fL (ref 80.0–100.0)
Monocytes Absolute: 1.1 K/uL — ABNORMAL HIGH (ref 0.1–1.0)
Monocytes Relative: 12 %
Neutro Abs: 6.9 K/uL (ref 1.7–7.7)
Neutrophils Relative %: 71 %
Platelets: 181 K/uL (ref 150–400)
RBC: 4.03 MIL/uL — ABNORMAL LOW (ref 4.22–5.81)
RDW: 14.7 % (ref 11.5–15.5)
WBC: 9.7 K/uL (ref 4.0–10.5)
nRBC: 0 % (ref 0.0–0.2)

## 2024-04-19 LAB — GLUCOSE, CAPILLARY
Glucose-Capillary: 158 mg/dL — ABNORMAL HIGH (ref 70–99)
Glucose-Capillary: 244 mg/dL — ABNORMAL HIGH (ref 70–99)

## 2024-04-19 MED ORDER — CARVEDILOL 3.125 MG PO TABS
3.1250 mg | ORAL_TABLET | Freq: Two times a day (BID) | ORAL | Status: DC
  Administered 2024-04-19: 3.125 mg via ORAL
  Filled 2024-04-19: qty 1

## 2024-04-19 MED ORDER — ENSURE PLUS HIGH PROTEIN PO LIQD
237.0000 mL | Freq: Two times a day (BID) | ORAL | Status: DC
  Administered 2024-04-19 (×2): 237 mL via ORAL

## 2024-04-19 MED ORDER — CARVEDILOL 3.125 MG PO TABS
3.1250 mg | ORAL_TABLET | Freq: Two times a day (BID) | ORAL | 0 refills | Status: AC
  Filled 2024-04-19: qty 60, 30d supply, fill #0

## 2024-04-19 NOTE — Evaluation (Signed)
 Occupational Therapy Evaluation Patient Details Name: [name] MRN: [id number] DOB: 14-May-1936 Today's Date: 04/19/2024   History of Present Illness   Patient is a 88 year old male with weakness and shortness of breath, found to be bradycardic. S/p leadless pacemaker 04/18/24. PMH: COPD, HFpEF, GERD, DM, HTN, HLD, hypothyroidism     Clinical Impressions Pt was seen for OT evaluation this date. Prior to hospital admission, pt was requiring rollator for mobility and demo's decreased activity tolerance and strength. Desats to 88% with mobility and endorses SOB but with seated rest break improves to 92-93% on 4L. Pt currently requires increased assist for LB ADL tasks.  Pt would benefit from skilled OT services to address noted impairments and functional limitations (see below for any additional details) in order to maximize safety and independence while minimizing future risk of falls, injury, and readmission. Anticipate the need for follow up OT services upon acute hospital DC.    If plan is discharge home, recommend the following:   A little help with walking and/or transfers;A little help with bathing/dressing/bathroom;Assistance with cooking/housework;Assist for transportation;Help with stairs or ramp for entrance     Functional Status Assessment   Patient has had a recent decline in their functional status and demonstrates the ability to make significant improvements in function in a reasonable and predictable amount of time.     Equipment Recommendations   None recommended by OT     Recommendations for Other Services         Precautions/Restrictions   Precautions Precautions: Fall Recall of Precautions/Restrictions: Intact Restrictions Weight Bearing Restrictions Per Provider Order: No     Mobility Bed Mobility Overal bed mobility: Needs Assistance Bed Mobility: Supine to Sit, Sit to Supine     Supine to sit: Supervision Sit to supine: Contact guard  assist   General bed mobility comments: of note, patient does not sleep in a bed at home. he sleeps in a recliner chair    Transfers Overall transfer level: Needs assistance Equipment used: Rollator (4 wheels) Transfers: Sit to/from Stand Sit to Stand: Contact guard assist           General transfer comment: intermittent breaks management required from therapist.      Balance Overall balance assessment: Mild deficits observed, not formally tested                                         ADL either performed or assessed with clinical judgement   ADL Overall ADL's : Needs assistance/impaired                                     Functional mobility during ADLs: Contact guard assist;Rolling walker (2 wheels) General ADL Comments: Pt requires increased assist for LB ADL tasks     Vision         Perception         Praxis         Pertinent Vitals/Pain Pain Assessment Pain Assessment: No/denies pain     Extremity/Trunk Assessment Upper Extremity Assessment Upper Extremity Assessment: Generalized weakness   Lower Extremity Assessment Lower Extremity Assessment: Generalized weakness       Communication Communication Communication: No apparent difficulties   Cognition Arousal: Alert Behavior During Therapy: [facility] for tasks assessed/performed  Following commands: Intact       Cueing  General Comments   Cueing Techniques: Verbal cues      Exercises     Shoulder Instructions      Home Living Family/patient expects to be discharged to:: Private residence Living Arrangements: Spouse/significant other Available Help at Discharge: Family Type of Home: House Home Access: Ramped entrance     Home Layout: One level               Home Equipment: Educational Psychologist (4 wheels);Wheelchair - manual;Hospital bed (he does not use the hospital bed)   Additional Comments:  oxygen      Prior Functioning/Environment Prior Level of Function : Independent/Modified Independent;History of Falls (last six months);Needs assist             Mobility Comments: using rollator for household ambulation. uses a wheelchair in the community and sometimes to get up and down the ramp- wheelchair mobility is with assistance of spouse ADLs Comments: spouse drives, cooks, and assists with ADLs as needed at home    OT Problem List: Decreased strength;Cardiopulmonary status limiting activity;Decreased activity tolerance;Impaired balance (sitting and/or standing);Decreased knowledge of use of DME or AE   OT Treatment/Interventions: Self-care/ADL training;Therapeutic exercise;Therapeutic activities;Energy conservation;DME and/or AE instruction;Patient/family education;Balance training      OT Goals(Current goals can be found in the care plan section)   Acute Rehab OT Goals Patient Stated Goal: go home OT Goal Formulation: With patient Time For Goal Achievement: 05/03/24 Potential to Achieve Goals: Good ADL Goals Pt Will Transfer to Toilet: with modified independence;ambulating;bedside commode ([facility]) Pt Will Perform Toileting - Clothing Manipulation and hygiene: with modified independence;sitting/lateral leans Additional ADL Goal #1: Pt will verbalize plan to implement at least 2 learned ECS to support ADL/IADL/mobility safety.   OT Frequency:  Min 2X/week    Co-evaluation PT/OT/SLP Co-Evaluation/Treatment: Yes Reason for Co-Treatment: Other (comment) (very limited endurance) PT goals addressed during session: Mobility/safety with mobility OT goals addressed during session: ADL's and self-care      AM-PAC OT 6 Clicks Daily Activity     Outcome Measure Help from another person eating meals?: None Help from another person taking care of personal grooming?: None Help from another person toileting, which includes using toliet, bedpan, or urinal?: A Little Help from  another person bathing (including washing, rinsing, drying)?: A Little Help from another person to put on and taking off regular upper body clothing?: None Help from another person to put on and taking off regular lower body clothing?: A Little 6 Click Score: 21   End of Session Equipment Utilized During Treatment: Oxygen;Rollator (4 wheels)  Activity Tolerance: Patient tolerated treatment well;Patient limited by fatigue Patient left: in bed;with call bell/phone within reach;with bed alarm set  OT Visit Diagnosis: Other abnormalities of gait and mobility (R26.89)                Time: 1010-1029 OT Time Calculation (min): 19 min Charges:  OT General Charges $OT Visit: 1 Visit OT Evaluation $OT Eval Low Complexity: 1 Low  Warren SAUNDERS., MPH, MS, OTR/L ascom [phone] 04/19/24, 11:10 AM

## 2024-04-19 NOTE — Plan of Care (Signed)

## 2024-04-19 NOTE — Discharge Summary (Signed)
 Physician Discharge Summary  [name] FMW:[id number] DOB: [birth year] DOA: 04/17/2024  PCP: Ileen Rosaline NOVAK, NP  Admit date: 04/17/2024 Discharge date: 04/19/2024  Admitted From: Home Disposition:  Home with home health  Recommendations for Outpatient Follow-up:  Follow up with PCP in 1-2 weeks Follow up cardiology  Home Health: Yes PT OT Equipment/Devices:Oxygen 4L via [place]   Discharge Condition:Stable  CODE STATUS:DNR  Diet recommendation: Reg  Brief/Interim Summary:  [name] is a pleasant 88 y.o. male with medical history significant for COPD, HFpEF, GERD, DM, HTN, HLD, hypothyroidism who presented to ED on 04/17/2024 for generalized weakness and low heart heartbeat at home.  Patient stated that he checks his blood pressure and heart rate every morning.  While checking he found that his heart rate was in 30s and blood pressure was low and he called ambulance.  EMS was called and found to have heart rate was 26, blood pressure was 110/60 and decreased to 80/50.  Patient was alert awake and oriented x 4.  EMS administered atropine.  Patient comes with a DNR form, history of CHF, COPD wears 4 L home oxygen at home via nasal cannula.  Patient denies any fever, cough, palpitations, nausea vomiting diarrhea.  However, he complains of mild worsening shortness of breath and ankle edema.   Discharge Diagnoses:  Principal Problem:   Complete heart block (HCC) Active Problems:   Hypothyroidism   Chronic obstructive pulmonary disease (HCC)   Stage 3a chronic kidney disease (CKD) (HCC)   Chronic systolic heart failure (HCC)   Essential hypertension   Type 2 diabetes mellitus with hyperglycemia (HCC) 88 year old male with history of COPD on home oxygen, CHF, HTN, DM, hypothyroidism who was brought into ED for bradycardia and hypotension.  Patient received 1 dose of atropine by EMS.  Patient takes Coreg  at home.   1.  Complete heart block Patient underwent pacemaker  implantation with interventional cardiology on 11/12.  Tolerated procedure well.  Postprocedure heart rate is in the 70s.  Paced rhythm. Plan: Restart Coreg  at low-dose of 3.125 mg twice daily.  Groin suture removed on day of discharge.  Stable for discharge home.  Follow-up outpatient cardiology and PCP.      Discharge Instructions  Discharge Instructions     Diet - low sodium heart healthy   Complete by: As directed    Increase activity slowly   Complete by: As directed    No wound care   Complete by: As directed       Allergies as of 04/19/2024       Reactions   Lodine [etodolac] Itching   Ropinirole Dermatitis   Cefaclor Rash        Medication List     STOP taking these medications    mupirocin ointment 2 % Commonly known as: BACTROBAN       TAKE these medications    acetaminophen  500 MG tablet Commonly known as: TYLENOL  Take 500 mg by mouth every 6 (six) hours as needed for mild pain (pain score 1-3).   albuterol  108 (90 Base) MCG/ACT inhaler Commonly known as: VENTOLIN  HFA Inhale 2 puffs into the lungs every 6 (six) hours as needed for wheezing or shortness of breath.   aspirin  EC 81 MG tablet Take 81 mg by mouth daily.   B-D ULTRAFINE III SHORT PEN 31G X 8 MM Misc Generic drug: Insulin  Pen Needle Inject into the skin daily.   benzonatate  100 MG capsule Commonly known as: TESSALON  Take 100 mg  by mouth 3 (three) times daily as needed.   carvedilol  3.125 MG tablet Commonly known as: COREG  Take 1 tablet (3.125 mg total) by mouth 2 (two) times daily with a meal. What changed:  medication strength how much to take   cholecalciferol 1000 units tablet Commonly known as: VITAMIN D Take 1,000 Units by mouth at bedtime.   furosemide  20 MG tablet Commonly known as: LASIX  Take 2 tablets (40 mg total) by mouth daily.   gabapentin  800 MG tablet Commonly known as: NEURONTIN  Take 800 mg by mouth every 8 (eight) hours.   glipiZIDE 10 MG  tablet Commonly known as: GLUCOTROL Take 10 mg by mouth every morning.   ipratropium-albuterol  0.5-2.5 (3) MG/3ML Soln Commonly known as: DUONEB Take 3 mLs by nebulization every 4 (four) hours as needed. DX-J44.9   Jardiance  25 MG Tabs tablet Generic drug: empagliflozin  Take 25 mg by mouth daily.   levothyroxine  137 MCG tablet Commonly known as: SYNTHROID  Take 137 mcg by mouth daily before breakfast.   liothyronine  5 MCG tablet Commonly known as: CYTOMEL  Take 5 mcg by mouth daily.   Lumigan 0.01 % Soln Generic drug: bimatoprost Place 1 drop into both eyes at bedtime.   magnesium  oxide 400 MG tablet Commonly known as: MAG-OX Take 400 mg by mouth daily.   montelukast  10 MG tablet Commonly known as: SINGULAIR  Take 10 mg by mouth daily.   OneTouch Ultra test strip Generic drug: glucose blood USE AS DIRECTED TO CHECK BLOOD GLUCOSE EVERY DAY   OXYGEN Inhale into the lungs. 2 litre at night   Potassium Chloride ER 20 MEQ Tbcr Take 1 tablet by mouth daily.   pravastatin  20 MG tablet Commonly known as: PRAVACHOL  Take 20 mg by mouth at bedtime.   Rhopressa 0.02 % Soln Generic drug: Netarsudil Dimesylate Place 1 drop into the right eye daily.   Roflumilast  250 MCG Tabs TAKE 1 TABLET BY MOUTH EVERY DAY   rOPINIRole 1 MG tablet Commonly known as: REQUIP Take 1 mg by mouth at bedtime.   Simbrinza 1-0.2 % Susp Generic drug: Brinzolamide-Brimonidine  Place 1 drop into both eyes 2 (two) times daily.   Toujeo  SoloStar 300 UNIT/ML Solostar Pen Generic drug: insulin  glargine (1 Unit Dial) Inject 15 Units into the skin in the morning.   traZODone 50 MG tablet Commonly known as: DESYREL Take 50 mg by mouth at bedtime as needed.   Trelegy Ellipta  200-62.5-25 MCG/ACT Aepb Generic drug: Fluticasone -Umeclidin-Vilant Take 1 puff by mouth daily.        Follow-up Information     Paraschos, Alexander, MD. Go in 1 week(s).   Specialty: Cardiology Why: Appointment  scheduled with Dr. Ammon on 04/30/24 at 2:15 PM Contact information: [address] [facility] [place] [address] [phone]         Ileen Rosaline NOVAK, NP. Schedule an appointment as soon as possible for a visit in 1 week(s).   Specialty: Nurse Practitioner Contact information: [address] [place] [address] [phone]                Allergies  Allergen Reactions   Lodine [Etodolac] Itching   Ropinirole Dermatitis   Cefaclor Rash    Consultations: Cardiology   Procedures/Studies: ECHOCARDIOGRAM COMPLETE Result Date: 04/18/2024    ECHOCARDIOGRAM REPORT   Patient Name:   SOULEYMANE SAIKI Sponsel Date of Exam: 04/17/2024 Medical Rec #:  [id number]     Height:       70.0 in Accession #:  [id number]    Weight:       195.5 lb Date of Birth:  [birth year]    BSA:          2.067 m Patient Age:    88 years      BP:           110/42 mmHg Patient Gender: M             HR:           27 bpm. Exam Location:  [facility] Procedure: 2D Echo, Cardiac Doppler and Color Doppler (Both Spectral and Color            Flow Doppler were utilized during procedure). Indications:     CHF-acute diastolic I50.31  History:         Patient has prior history of Echocardiogram examinations, most                  recent 05/22/2022. CHF, COPD; Risk Factors:Hypertension and                  Sleep Apnea.  Sonographer:     Christopher Furnace Referring Phys:  [id number] CARALYN HUDSON Diagnosing Phys: Marsa Dooms MD IMPRESSIONS  1. Left ventricular ejection fraction, by estimation, is 60 to 65%. The left ventricle has normal function. The left ventricle has no regional wall motion abnormalities. Indeterminate diastolic filling due to E-A fusion.  2. Right ventricular systolic function is normal. The right ventricular size is normal.  3. The mitral valve is normal in structure. No evidence of mitral valve regurgitation. No evidence of mitral stenosis.  4. The aortic valve  is normal in structure. Aortic valve regurgitation is not visualized. Mild aortic valve stenosis.  5. The inferior vena cava is normal in size with greater than 50% respiratory variability, suggesting right atrial pressure of 3 mmHg. FINDINGS  Left Ventricle: Left ventricular ejection fraction, by estimation, is 60 to 65%. The left ventricle has normal function. The left ventricle has no regional wall motion abnormalities. Strain was performed and the global longitudinal strain is indeterminate. The left ventricular internal cavity size was normal in size. There is no left ventricular hypertrophy. Indeterminate diastolic filling due to E-A fusion. Right Ventricle: The right ventricular size is normal. No increase in right ventricular wall thickness. Right ventricular systolic function is normal. Left Atrium: Left atrial size was normal in size. Right Atrium: Right atrial size was normal in size. Pericardium: There is no evidence of pericardial effusion. Mitral Valve: The mitral valve is normal in structure. No evidence of mitral valve regurgitation. No evidence of mitral valve stenosis. Tricuspid Valve: The tricuspid valve is normal in structure. Tricuspid valve regurgitation is not demonstrated. No evidence of tricuspid stenosis. Aortic Valve: The aortic valve is normal in structure. Aortic valve regurgitation is not visualized. Mild aortic stenosis is present. Aortic valve mean gradient measures 7.0 mmHg. Aortic valve peak gradient measures 12.9 mmHg. Aortic valve area, by VTI measures 1.27 cm. Pulmonic Valve: The pulmonic valve was normal in structure. Pulmonic valve regurgitation is not visualized. No evidence of pulmonic stenosis. Aorta: The aortic root is normal in size and structure. Venous: The inferior vena cava is normal in size with greater than 50% respiratory variability, suggesting right atrial pressure of 3 mmHg. IAS/Shunts: No atrial level shunt detected by color flow Doppler. Additional Comments:  3D was performed not requiring image post processing on an independent workstation and was indeterminate.  LEFT VENTRICLE PLAX 2D LVIDd:  5.30 cm   Diastology LVIDs:         3.80 cm   LV e' medial:    11.10 cm/s LV PW:         1.50 cm   LV E/e' medial:  8.5 LV IVS:        1.40 cm   LV e' lateral:   12.00 cm/s LVOT diam:     2.30 cm   LV E/e' lateral: 7.8 LV SV:         61 LV SV Index:   30 LVOT Area:     4.15 cm LV IVRT:       92 msec  RIGHT VENTRICLE RV Basal diam:  3.80 cm RV Mid diam:    3.00 cm RV S prime:     11.40 cm/s TAPSE (M-mode): 1.4 cm LEFT ATRIUM             Index        RIGHT ATRIUM           Index LA diam:        3.30 cm 1.60 cm/m   RA Area:     23.70 cm LA Vol (A2C):   53.2 ml 25.73 ml/m  RA Volume:   73.50 ml  35.55 ml/m LA Vol (A4C):   45.3 ml 21.91 ml/m LA Biplane Vol: 53.4 ml 25.83 ml/m  AORTIC VALVE AV Area (Vmax):    1.17 cm AV Area (Vmean):   1.19 cm AV Area (VTI):     1.27 cm AV Vmax:           179.33 cm/s AV Vmean:          118.333 cm/s AV VTI:            0.484 m AV Peak Grad:      12.9 mmHg AV Mean Grad:      7.0 mmHg LVOT Vmax:         50.30 cm/s LVOT Vmean:        34.000 cm/s LVOT VTI:          0.148 m LVOT/AV VTI ratio: 0.31  AORTA Ao Root diam: 3.50 cm MITRAL VALVE               TRICUSPID VALVE MV Area (PHT): 7.09 cm    TR Peak grad:   7.1 mmHg MV Decel Time: 107 msec    TR Vmax:        133.00 cm/s MV E velocity: 93.80 cm/s                            SHUNTS                            Systemic VTI:  0.15 m                            Systemic Diam: 2.30 cm Marsa Dooms MD Electronically signed by Marsa Dooms MD Signature Date/Time: 04/18/2024/11:07:34 AM    Final    EP PPM/ICD IMPLANT Result Date: 04/18/2024 Successful implantation of Medtronic Micra AV 2 leadless pacemaker   CARDIAC CATHETERIZATION Result Date: 04/18/2024 Successful implantation of Medtronic Micra AV 2 leadless pacemaker   DG Chest Port 1 View Result Date: 04/17/2024 CLINICAL  DATA:  Weakness.  Bradycardia. EXAM: PORTABLE CHEST 1 VIEW COMPARISON:  03/21/2024 FINDINGS: The cardio  pericardial silhouette is enlarged. Vascular congestion with interstitial opacity suggests edema, similar to prior. Improved aeration in the lung bases bilaterally with some residual basilar atelectasis and probable tiny bilateral pleural effusions. Telemetry leads overlie the chest. IMPRESSION: Interval improvement in aeration in the lung bases with some residual basilar atelectasis and probable tiny bilateral pleural effusions. Electronically Signed   By: Camellia Candle M.D.   On: 04/17/2024 11:11   DG Chest Portable 1 View Result Date: 03/21/2024 EXAM: 1 VIEW XRAY OF THE CHEST 03/21/2024 09:35:45 PM COMPARISON: 05/22/2022 CLINICAL HISTORY: Short of breath. c/o shortness of breath. Pt with hx of COPD, on 3L [place] at baseline, found with O2 sat 80% with significant work of breathing FINDINGS: LUNGS AND PLEURA: Central pulmonary vascular congestion. Interstitial opacity scattered throughout the mid and lower lungs. Small bilateral pleural effusions. Consolidation in the left lung base. No pneumothorax. HEART AND MEDIASTINUM: The heart is enlarged. BONES AND SOFT TISSUES: No acute osseous abnormality. IMPRESSION: 1. Small bilateral pleural effusions 2. Left basilar pneumonia 3. Cardiomegaly 4. Pulmonary edema pattern Electronically signed by: Greig Pique MD 03/21/2024 09:39 PM EDT RP Workstation: [id number]      Subjective: Seen and examined the day of discharge.  Some concern about disorientation.  Suspected hospital-acquired delirium.  Discharge Exam: Vitals:   04/19/24 0854 04/19/24 1135  BP:  (!) 120/56  Pulse:  82  Resp: (!) 26 18  Temp:  98.6 F (37 C)  SpO2:  95%   Vitals:   04/19/24 0556 04/19/24 0835 04/19/24 0854 04/19/24 1135  BP: 138/61 (!) 99/48  (!) 120/56  Pulse: 80 78  82  Resp: 20  (!) 26 18  Temp: 98.2 F (36.8 C) 98.1 F (36.7 C)  98.6 F (37 C)  TempSrc: Oral   Oral   SpO2: 90% 95%  95%  Weight:      Height:        General: Pt is alert, awake, not in acute distress Cardiovascular: RRR, S1/S2 +, no rubs, no gallops Respiratory: CTA bilaterally, no wheezing, no rhonchi Abdominal: Soft, NT, ND, bowel sounds + Extremities: no edema, no cyanosis    The results of significant diagnostics from this hospitalization (including imaging, microbiology, ancillary and laboratory) are listed below for reference.     Microbiology: No results found for this or any previous visit (from the past 240 hours).   Labs: BNP (last 3 results) Recent Labs    03/21/24 2125  BNP 413.3*   Basic Metabolic Panel: Recent Labs  Lab 04/17/24 1006 04/18/24 1350 04/19/24 0506  NA 134* 135 137  K 4.3 4.3 4.5  CL 89* 89* 94*  CO2 38* 39* 38*  GLUCOSE 171* 141* 91  BUN 19 21 14   CREATININE 0.92 0.86 0.71  CALCIUM 9.1 8.9 8.7*   Liver Function Tests: Recent Labs  Lab 04/17/24 1006 04/18/24 1350  AST 14* 15  ALT 6 7  ALKPHOS 73 78  BILITOT 0.4 0.5  PROT 6.1* 6.2*  ALBUMIN 3.4* 3.4*   No results for input(s): LIPASE, AMYLASE in the last 168 hours. No results for input(s): AMMONIA in the last 168 hours. CBC: Recent Labs  Lab 04/17/24 1006 04/18/24 1350 04/19/24 1250  WBC 9.7 8.6 9.7  NEUTROABS 6.2  --  6.9  HGB 11.5* 12.7* 12.1*  HCT 38.0* 41.8 40.2  MCV 100.3* 98.6 99.8  PLT 160 188 181   Cardiac Enzymes: No results for input(s): CKTOTAL, CKMB, CKMBINDEX, TROPONINI in the last 168 hours. BNP: Invalid  input(s): POCBNP CBG: Recent Labs  Lab 04/18/24 0725 04/18/24 1718 04/18/24 2032 04/19/24 0831 04/19/24 1230  GLUCAP 137* 218* 150* 158* 244*   D-Dimer No results for input(s): DDIMER in the last 72 hours. Hgb A1c No results for input(s): HGBA1C in the last 72 hours. Lipid Profile No results for input(s): CHOL, HDL, LDLCALC, TRIG, CHOLHDL, LDLDIRECT in the last 72 hours. Thyroid function studies No  results for input(s): TSH, T4TOTAL, T3FREE, THYROIDAB in the last 72 hours.  Invalid input(s): FREET3 Anemia work up No results for input(s): VITAMINB12, FOLATE, FERRITIN, TIBC, IRON, RETICCTPCT in the last 72 hours. Urinalysis    Component Value Date/Time   COLORURINE YELLOW (A) 04/19/2024 1230   APPEARANCEUR CLEAR (A) 04/19/2024 1230   APPEARANCEUR Clear 08/01/2013 1614   LABSPEC 1.016 04/19/2024 1230   LABSPEC 1.006 08/01/2013 1614   PHURINE 8.0 04/19/2024 1230   GLUCOSEU >=500 (A) 04/19/2024 1230   GLUCOSEU Negative 08/01/2013 1614   HGBUR NEGATIVE 04/19/2024 1230   BILIRUBINUR NEGATIVE 04/19/2024 1230   BILIRUBINUR Negative 08/01/2013 1614   KETONESUR NEGATIVE 04/19/2024 1230   PROTEINUR NEGATIVE 04/19/2024 1230   NITRITE NEGATIVE 04/19/2024 1230   LEUKOCYTESUR NEGATIVE 04/19/2024 1230   LEUKOCYTESUR Negative 08/01/2013 1614   Sepsis Labs Recent Labs  Lab 04/17/24 1006 04/18/24 1350 04/19/24 1250  WBC 9.7 8.6 9.7   Microbiology No results found for this or any previous visit (from the past 240 hours).   Time coordinating discharge: 40 minutes  SIGNED:   Calvin KATHEE Robson, MD  [facility] 04/19/2024, 1:59 PM Pager   If 7PM-7AM, please contact night-coverage

## 2024-04-19 NOTE — Discharge Instructions (Signed)
 Please avoid showering until tomorrow 04/20/2024 and do not submerge yourself in water (baths, oceans, swimming pools) for the next week. If your bandage gets wet or starts to fall off, replace it with another piece of gauze and the Tegaderm (clear bandage I provided). You should try to keep it covered for a week. You will follow up with Dr. Ammon in the office in about 1 week. If you have bleeding from the site, lie down and apply firm pressure for 20 minutes, if it continues to bleed, call our office [phone]). Avoid heavy lifting, squatting (other than sitting) and strenuous activity for 1 week. You will get a call from [employer] to set up your pacemaker and the [employer] device. You do not need to bring this device with you to appointments. It is used to monitor your pacemaker from home.

## 2024-04-19 NOTE — Evaluation (Signed)
 Physical Therapy Evaluation Patient Details Name: [name] MRN: [id number] DOB: [birth year] Today's Date: 04/19/2024  History of Present Illness  Patient is a 88 year old male with weakness and shortness of breath, found to be bradycardic. S/p leadless pacemaker 04/18/24. PMH: COPD, HFpEF, GERD, DM, HTN, HLD, hypothyroidism  Clinical Impression  Patient is agreeable to PT session. He has limited endurance at baseline and only ambulates limited distance using a rollator. He is on 4 L02 at home. He lives with is spouse and has intermittent caregiver assistance as needed with ADLs and mobility. He has a ramped entrance to his home and uses a wheelchair to get in and out of the house sometimes.  Today the patient has generalized weakness. He is dyspneic with mobility with limited standing endurance. He walked just outside the room and back to bed with a rollator with CGA. Sp02 briefly down to 88% after walking and increases quickly back to 90's with rest break and cues for breathing techniques. Encouraged energy conservation strategies. Recommend continued caregiver support in the home setting. PT will continue to follow to maximize independence.       If plan is discharge home, recommend the following: A little help with walking and/or transfers;A little help with bathing/dressing/bathroom;Assistance with cooking/housework;Help with stairs or ramp for entrance;Assist for transportation   Can travel by private vehicle        Equipment Recommendations None recommended by PT  Recommendations for Other Services       Functional Status Assessment Patient has had a recent decline in their functional status and demonstrates the ability to make significant improvements in function in a reasonable and predictable amount of time.     Precautions / Restrictions Precautions Precautions: Fall Recall of Precautions/Restrictions: Intact Restrictions Weight Bearing Restrictions Per Provider Order:  No      Mobility  Bed Mobility Overal bed mobility: Needs Assistance Bed Mobility: Supine to Sit, Sit to Supine     Supine to sit: Supervision Sit to supine: Contact guard assist   General bed mobility comments: of note, patient does not sleep in a bed at home. he sleeps in a recliner chair    Transfers Overall transfer level: Needs assistance Equipment used: Rollator (4 wheels) Transfers: Sit to/from Stand Sit to Stand: Contact guard assist           General transfer comment: intermittent breaks management required from therapist.    Ambulation/Gait Ambulation/Gait assistance: Contact guard assist Gait Distance (Feet): 25 Feet Assistive device: Rollator (4 wheels) Gait Pattern/deviations: Step-through pattern Gait velocity: decreased     General Gait Details: slow cadence with no loss of balance with walking. gait distance is limited now at at baseline. encouraged energy conservation techniques as using the seat on the rollator for a rest breaks as needed. Sp02 down briefly to 88% after walking on 4 L02, increasing to low 90's with rest break and breathing techniques  Stairs Stairs:  (not required for home entry. discussed continued use of wheelchair to get up/down the ramp as needed at home given limited baseline endurance. this is to conserve energy and prevent falls.)          Wheelchair Mobility     Tilt Bed    Modified Rankin (Stroke Patients Only)       Balance Overall balance assessment: Mild deficits observed, not formally tested  Pertinent Vitals/Pain Pain Assessment Pain Assessment: No/denies pain    Home Living Family/patient expects to be discharged to:: Private residence Living Arrangements: Spouse/significant other Available Help at Discharge: Family Type of Home: House Home Access: Ramped entrance       Home Layout: One level Home Equipment: Educational Psychologist (4  wheels);Wheelchair - manual;Hospital bed (he does not use the hospital bed) Additional Comments: oxygen    Prior Function Prior Level of Function : Independent/Modified Independent;History of Falls (last six months);Needs assist             Mobility Comments: using rollator for household ambulation. uses a wheelchair in the community and sometimes to get up and down the ramp- wheelchair mobility is with assistance of spouse ADLs Comments: spouse drives, cooks, and assists with ADLs as needed at home     Extremity/Trunk Assessment   Upper Extremity Assessment Upper Extremity Assessment: Defer to OT evaluation    Lower Extremity Assessment Lower Extremity Assessment: Generalized weakness       Communication   Communication Communication: No apparent difficulties    Cognition Arousal: Alert Behavior During Therapy: WFL for tasks assessed/performed   PT - Cognitive impairments: No apparent impairments                         Following commands: Intact       Cueing Cueing Techniques: Verbal cues     General Comments      Exercises     Assessment/Plan    PT Assessment Patient needs continued PT services  PT Problem List Cardiopulmonary status limiting activity;Decreased strength;Decreased range of motion;Decreased activity tolerance;Decreased balance;Decreased mobility       PT Treatment Interventions DME instruction;Gait training;Functional mobility training;Therapeutic activities;Balance training;Neuromuscular re-education;Therapeutic exercise;Cognitive remediation;Patient/family education;Wheelchair mobility training    PT Goals (Current goals can be found in the Care Plan section)  Acute Rehab PT Goals Patient Stated Goal: to be able to manage at home PT Goal Formulation: With patient Time For Goal Achievement: 05/03/24 Potential to Achieve Goals: Fair    Frequency Min 2X/week     Co-evaluation PT/OT/SLP Co-Evaluation/Treatment: Yes Reason  for Co-Treatment: Other (comment) (very limited endurance) PT goals addressed during session: Mobility/safety with mobility         AM-PAC PT 6 Clicks Mobility  Outcome Measure Help needed turning from your back to your side while in a flat bed without using bedrails?: A Little Help needed moving from lying on your back to sitting on the side of a flat bed without using bedrails?: A Little Help needed moving to and from a bed to a chair (including a wheelchair)?: A Little Help needed standing up from a chair using your arms (e.g., wheelchair or bedside chair)?: A Little Help needed to walk in hospital room?: A Little Help needed climbing 3-5 steps with a railing? : A Lot 6 Click Score: 17    End of Session Equipment Utilized During Treatment: Oxygen (4 L02) Activity Tolerance: Patient limited by fatigue Patient left: in bed;with call bell/phone within reach;with bed alarm set Nurse Communication:  (alerted MD of patient's functional status) PT Visit Diagnosis: Unsteadiness on feet (R26.81);Muscle weakness (generalized) (M62.81)    Time: 1008-1030 PT Time Calculation (min) (ACUTE ONLY): 22 min   Charges:   PT Evaluation $PT Eval Moderate Complexity: 1 Mod   PT General Charges $$ ACUTE PT VISIT: 1 Visit         Randine Essex, PT, MPT   Randine GAILS  Lang 04/19/2024, 10:55 AM

## 2024-04-19 NOTE — Care Management Important Message (Signed)
 Important Message  Patient Details  Name: [name] MRN: [id number] Date of Birth: Dec 23, 1935   Important Message Given:  Yes - Medicare IM     [name] 04/19/2024, 2:40 PM

## 2024-04-19 NOTE — Progress Notes (Signed)
 [facility] CLINIC CARDIOLOGY PROGRESS NOTE       Patient ID: [name] MRN: [id number] DOB/AGE: 1936/02/04 88 y.o.  Admit date: 04/17/2024 Referring Physician Dr. Artist Kerns Primary Physician Schmerge, Rosaline NOVAK, NP  Primary Cardiologist Dr. Hester (last seen 2023) Reason for Consultation symptomatic bradycardia  HPI: [name] is a 88 y.o. male  with a past medical history of COPD, diastolic CHF, GERD, type 2 diabetes mellitus, hypertension, dyslipidemia and hypothyroidism who presented to the ED on 04/17/2024 for weakness and SOB. Found to be significantly bradycardia and EMS gave atropine. Cardiology was consulted for further evaluation.   Interval history: - Patient seen and examined this morning, resting comfortably. - States he is feeling well overall, denies any significant dizziness or lightheadedness. - Heart rate in the 70s on telemetry, device appears to be functioning appropriately. - R groin access site with no bruising, tenderness, mass, or active bleeding. Suture removed without complication.  Review of systems complete and found to be negative unless listed above    Past Medical History:  Diagnosis Date   Arthritis    Asthma    CHF (congestive heart failure) (HCC)    COPD (chronic obstructive pulmonary disease) (HCC)    Diabetes mellitus without complication (HCC)    GERD (gastroesophageal reflux disease)    H/O Graves' disease    H/O wheezing    HOH (hard of hearing)    Hypercholesterolemia    Hypertension    Hypothyroidism    Lower extremity edema    Multiple allergies    Palpitations    Peripheral vascular disease    swelling of feet and legs   Pneumonia    in the past   PONV (postoperative nausea and vomiting)    Shortness of breath dyspnea    Sleep apnea    CPAP    Past Surgical History:  Procedure Laterality Date   CARDIAC CATHETERIZATION     CATARACT EXTRACTION W/PHACO Right 09/01/2015   Procedure: CATARACT EXTRACTION PHACO AND  INTRAOCULAR LENS PLACEMENT (IOC);  Surgeon: Steven Dingeldein, MD;  Location: [facility] ORS;  Service: Ophthalmology;  Laterality: Right;  US  01:33 AP% 24.9 CDE 42.99 fluid pack lot # [id number]   CATARACT EXTRACTION W/PHACO Left 09/22/2015   Procedure: CATARACT EXTRACTION PHACO AND INTRAOCULAR LENS PLACEMENT (IOC);  Surgeon: Steven Dingeldein, MD;  Location: [facility] ORS;  Service: Ophthalmology;  Laterality: Left;  US  01:28 AP% 20.9 CDE 29.54 fluid pack lot # [id number]   CHOLECYSTECTOMY     EYE SURGERY     Orbital Decompression     PACEMAKER LEADLESS INSERTION N/A 04/18/2024   Procedure: PACEMAKER LEADLESS INSERTION;  Surgeon: Ammon Blunt, MD;  Location: [facility] INVASIVE CV LAB;  Service: Cardiovascular;  Laterality: N/A;   TEMPORARY PACEMAKER N/A 04/18/2024   Procedure: TEMPORARY PACEMAKER;  Surgeon: Ammon Blunt, MD;  Location: [facility] INVASIVE CV LAB;  Service: Cardiovascular;  Laterality: N/A;   TONSILLECTOMY      Medications Prior to Admission  Medication Sig Dispense Refill Last Dose/Taking   acetaminophen  (TYLENOL ) 500 MG tablet Take 500 mg by mouth every 6 (six) hours as needed for mild pain (pain score 1-3).   Taking As Needed   aspirin  EC 81 MG tablet Take 81 mg by mouth daily.   04/16/2024   benzonatate  (TESSALON ) 100 MG capsule Take 100 mg by mouth 3 (three) times daily as needed.   Taking As Needed   carvedilol  (COREG ) 25 MG tablet Take 25 mg by mouth 2 (two) times daily with  a meal.   04/17/2024   cholecalciferol (VITAMIN D) 1000 units tablet Take 1,000 Units by mouth at bedtime.    04/16/2024   furosemide  (LASIX ) 20 MG tablet Take 2 tablets (40 mg total) by mouth daily. 30 tablet 0 04/17/2024   gabapentin  (NEURONTIN ) 800 MG tablet Take 800 mg by mouth every 8 (eight) hours.   04/17/2024   glipiZIDE (GLUCOTROL) 10 MG tablet Take 10 mg by mouth every morning.   04/17/2024   ipratropium-albuterol  (DUONEB) 0.5-2.5 (3) MG/3ML SOLN Take 3 mLs by nebulization every 4 (four) hours  as needed. DX-J44.9 360 mL 3 Taking As Needed   JARDIANCE  25 MG TABS tablet Take 25 mg by mouth daily.   04/16/2024   levothyroxine  (SYNTHROID , LEVOTHROID) 137 MCG tablet Take 137 mcg by mouth daily before breakfast.   04/17/2024   liothyronine  (CYTOMEL ) 5 MCG tablet Take 5 mcg by mouth daily.   04/16/2024   LUMIGAN 0.01 % SOLN Place 1 drop into both eyes at bedtime.   04/16/2024   magnesium  oxide (MAG-OX) 400 MG tablet Take 400 mg by mouth daily.   04/16/2024   montelukast  (SINGULAIR ) 10 MG tablet Take 10 mg by mouth daily.   04/16/2024   Potassium Chloride ER 20 MEQ TBCR Take 1 tablet by mouth daily.   04/16/2024   pravastatin  (PRAVACHOL ) 20 MG tablet Take 20 mg by mouth at bedtime.   04/16/2024   RHOPRESSA 0.02 % SOLN Place 1 drop into the right eye daily.   04/16/2024   Roflumilast  250 MCG TABS TAKE 1 TABLET BY MOUTH EVERY DAY 28 tablet 0 04/16/2024   rOPINIRole (REQUIP) 1 MG tablet Take 1 mg by mouth at bedtime.   04/16/2024   SIMBRINZA 1-0.2 % SUSP Place 1 drop into both eyes 2 (two) times daily.   04/16/2024   TOUJEO  SOLOSTAR 300 UNIT/ML Solostar Pen Inject 15 Units into the skin in the morning.   04/16/2024   traZODone (DESYREL) 50 MG tablet Take 50 mg by mouth at bedtime as needed.   Taking As Needed   TRELEGY ELLIPTA  200-62.5-25 MCG/ACT AEPB Take 1 puff by mouth daily.   04/16/2024   albuterol  (VENTOLIN  HFA) 108 (90 Base) MCG/ACT inhaler Inhale 2 puffs into the lungs every 6 (six) hours as needed for wheezing or shortness of breath. (Patient not taking: Reported on 04/17/2024) 8 g 5 Not Taking   B-D ULTRAFINE III SHORT PEN 31G X 8 MM MISC Inject into the skin daily.      mupirocin ointment (BACTROBAN) 2 % Apply 1 Application topically 2 (two) times daily. (Patient not taking: Reported on 04/17/2024)   Not Taking   ONETOUCH ULTRA test strip USE AS DIRECTED TO CHECK BLOOD GLUCOSE EVERY DAY      OXYGEN Inhale into the lungs. 2 litre at night      Social History   Socioeconomic History    Marital status: Married    Spouse name: Not on file   Number of children: Not on file   Years of education: Not on file   Highest education level: Not on file  Occupational History   Not on file  Tobacco Use   Smoking status: Former   Smokeless tobacco: Never  Substance and Sexual Activity   Alcohol use: Yes    Comment: couple glasses of wine daily   Drug use: No   Sexual activity: Not on file  Other Topics Concern   Not on file  Social History Narrative   Not on  file   Social Drivers of Health   Financial Resource Strain: Not on file  Food Insecurity: No Food Insecurity (03/23/2024)   Hunger Vital Sign    Worried About Running Out of Food in the Last Year: Never true    Ran Out of Food in the Last Year: Never true  Transportation Needs: No Transportation Needs (03/23/2024)   PRAPARE - Administrator, Civil Service (Medical): No    Lack of Transportation (Non-Medical): No  Physical Activity: Not on file  Stress: Not on file  Social Connections: Unknown (03/23/2024)   Social Connection and Isolation Panel    Frequency of Communication with Friends and Family: Patient declined    Frequency of Social Gatherings with Friends and Family: Not on file    Attends Religious Services: Not on file    Active Member of Clubs or Organizations: Not on file    Attends Banker Meetings: Not on file    Marital Status: Not on file  Intimate Partner Violence: Not At Risk (03/23/2024)   Humiliation, Afraid, Rape, and Kick questionnaire    Fear of Current or Ex-Partner: No    Emotionally Abused: No    Physically Abused: No    Sexually Abused: No    Family History  Problem Relation Age of Onset   Diabetes Father      Vitals:   04/18/24 2029 04/18/24 2306 04/19/24 0556 04/19/24 0835  BP: 117/77 (!) 140/63 138/61 (!) 99/48  Pulse: 69 73 80 78  Resp: 17 17 20    Temp: (!) 97.4 F (36.3 C) 98.1 F (36.7 C) 98.2 F (36.8 C) 98.1 F (36.7 C)  TempSrc:  Oral  Oral   SpO2: 94% 99% 90% 95%  Weight:      Height:        PHYSICAL EXAM General: Chronically ill-appearing elderly male, well nourished, in no acute distress. HEENT: Normocephalic and atraumatic. Neck: No JVD.  Lungs: Normal respiratory effort on 4L [place]. Clear bilaterally to auscultation. No wheezes, crackles, rhonchi.  Heart: HRRR. Normal S1 and S2 without gallops or murmurs.  Abdomen: Non-distended appearing.  Msk: Normal strength and tone for age. Extremities: Warm and well perfused. No clubbing, cyanosis.  No edema.  Neuro: Alert and oriented X 3. Psych: Answers questions appropriately.   Labs: Basic Metabolic Panel: Recent Labs    04/18/24 1350 04/19/24 0506  NA 135 137  K 4.3 4.5  CL 89* 94*  CO2 39* 38*  GLUCOSE 141* 91  BUN 21 14  CREATININE 0.86 0.71  CALCIUM 8.9 8.7*   Liver Function Tests: Recent Labs    04/17/24 1006 04/18/24 1350  AST 14* 15  ALT 6 7  ALKPHOS 73 78  BILITOT 0.4 0.5  PROT 6.1* 6.2*  ALBUMIN 3.4* 3.4*   No results for input(s): LIPASE, AMYLASE in the last 72 hours. CBC: Recent Labs    04/17/24 1006 04/18/24 1350  WBC 9.7 8.6  NEUTROABS 6.2  --   HGB 11.5* 12.7*  HCT 38.0* 41.8  MCV 100.3* 98.6  PLT 160 188   Cardiac Enzymes: No results for input(s): CKTOTAL, CKMB, CKMBINDEX, TROPONINIHS in the last 72 hours. BNP: No results for input(s): BNP in the last 72 hours. D-Dimer: No results for input(s): DDIMER in the last 72 hours. Hemoglobin A1C: No results for input(s): HGBA1C in the last 72 hours. Fasting Lipid Panel: No results for input(s): CHOL, HDL, LDLCALC, TRIG, CHOLHDL, LDLDIRECT in the last 72 hours. Thyroid Function Tests:  No results for input(s): TSH, T4TOTAL, T3FREE, THYROIDAB in the last 72 hours.  Invalid input(s): FREET3 Anemia Panel: No results for input(s): VITAMINB12, FOLATE, FERRITIN, TIBC, IRON, RETICCTPCT in the last 72 hours.   Radiology:  ECHOCARDIOGRAM COMPLETE Result Date: 04/18/2024    ECHOCARDIOGRAM REPORT   Patient Name:   [name] Else Date of Exam: 04/17/2024 Medical Rec #:  [id number]     Height:       70.0 in Accession #:    [id number]    Weight:       195.5 lb Date of Birth:  March 08, 1936    BSA:          2.067 m Patient Age:    88 years      BP:           110/42 mmHg Patient Gender: M             HR:           27 bpm. Exam Location:  [facility] Procedure: 2D Echo, Cardiac Doppler and Color Doppler (Both Spectral and Color            Flow Doppler were utilized during procedure). Indications:     CHF-acute diastolic I50.31  History:         Patient has prior history of Echocardiogram examinations, most                  recent 05/22/2022. CHF, COPD; Risk Factors:Hypertension and                  Sleep Apnea.  Sonographer:     Christopher Furnace Referring Phys:  [id number] Caedyn Raygoza Diagnosing Phys: Marsa Dooms MD IMPRESSIONS  1. Left ventricular ejection fraction, by estimation, is 60 to 65%. The left ventricle has normal function. The left ventricle has no regional wall motion abnormalities. Indeterminate diastolic filling due to E-A fusion.  2. Right ventricular systolic function is normal. The right ventricular size is normal.  3. The mitral valve is normal in structure. No evidence of mitral valve regurgitation. No evidence of mitral stenosis.  4. The aortic valve is normal in structure. Aortic valve regurgitation is not visualized. Mild aortic valve stenosis.  5. The inferior vena cava is normal in size with greater than 50% respiratory variability, suggesting right atrial pressure of 3 mmHg. FINDINGS  Left Ventricle: Left ventricular ejection fraction, by estimation, is 60 to 65%. The left ventricle has normal function. The left ventricle has no regional wall motion abnormalities. Strain was performed and the global longitudinal strain is indeterminate. The left ventricular internal cavity size was normal in size. There is no left  ventricular hypertrophy. Indeterminate diastolic filling due to E-A fusion. Right Ventricle: The right ventricular size is normal. No increase in right ventricular wall thickness. Right ventricular systolic function is normal. Left Atrium: Left atrial size was normal in size. Right Atrium: Right atrial size was normal in size. Pericardium: There is no evidence of pericardial effusion. Mitral Valve: The mitral valve is normal in structure. No evidence of mitral valve regurgitation. No evidence of mitral valve stenosis. Tricuspid Valve: The tricuspid valve is normal in structure. Tricuspid valve regurgitation is not demonstrated. No evidence of tricuspid stenosis. Aortic Valve: The aortic valve is normal in structure. Aortic valve regurgitation is not visualized. Mild aortic stenosis is present. Aortic valve mean gradient measures 7.0 mmHg. Aortic valve peak gradient measures 12.9 mmHg. Aortic valve area, by VTI measures 1.27 cm. Pulmonic Valve: The pulmonic valve  was normal in structure. Pulmonic valve regurgitation is not visualized. No evidence of pulmonic stenosis. Aorta: The aortic root is normal in size and structure. Venous: The inferior vena cava is normal in size with greater than 50% respiratory variability, suggesting right atrial pressure of 3 mmHg. IAS/Shunts: No atrial level shunt detected by color flow Doppler. Additional Comments: 3D was performed not requiring image post processing on an independent workstation and was indeterminate.  LEFT VENTRICLE PLAX 2D LVIDd:         5.30 cm   Diastology LVIDs:         3.80 cm   LV e' medial:    11.10 cm/s LV PW:         1.50 cm   LV E/e' medial:  8.5 LV IVS:        1.40 cm   LV e' lateral:   12.00 cm/s LVOT diam:     2.30 cm   LV E/e' lateral: 7.8 LV SV:         61 LV SV Index:   30 LVOT Area:     4.15 cm LV IVRT:       92 msec  RIGHT VENTRICLE RV Basal diam:  3.80 cm RV Mid diam:    3.00 cm RV S prime:     11.40 cm/s TAPSE (M-mode): 1.4 cm LEFT ATRIUM              Index        RIGHT ATRIUM           Index LA diam:        3.30 cm 1.60 cm/m   RA Area:     23.70 cm LA Vol (A2C):   53.2 ml 25.73 ml/m  RA Volume:   73.50 ml  35.55 ml/m LA Vol (A4C):   45.3 ml 21.91 ml/m LA Biplane Vol: 53.4 ml 25.83 ml/m  AORTIC VALVE AV Area (Vmax):    1.17 cm AV Area (Vmean):   1.19 cm AV Area (VTI):     1.27 cm AV Vmax:           179.33 cm/s AV Vmean:          118.333 cm/s AV VTI:            0.484 m AV Peak Grad:      12.9 mmHg AV Mean Grad:      7.0 mmHg LVOT Vmax:         50.30 cm/s LVOT Vmean:        34.000 cm/s LVOT VTI:          0.148 m LVOT/AV VTI ratio: 0.31  AORTA Ao Root diam: 3.50 cm MITRAL VALVE               TRICUSPID VALVE MV Area (PHT): 7.09 cm    TR Peak grad:   7.1 mmHg MV Decel Time: 107 msec    TR Vmax:        133.00 cm/s MV E velocity: 93.80 cm/s                            SHUNTS                            Systemic VTI:  0.15 m  Systemic Diam: 2.30 cm Marsa Dooms MD Electronically signed by Marsa Dooms MD Signature Date/Time: 04/18/2024/11:07:34 AM    Final    EP PPM/ICD IMPLANT Result Date: 04/18/2024 Successful implantation of Medtronic Micra AV 2 leadless pacemaker   CARDIAC CATHETERIZATION Result Date: 04/18/2024 Successful implantation of Medtronic Micra AV 2 leadless pacemaker   DG Chest Port 1 View Result Date: 04/17/2024 CLINICAL DATA:  Weakness.  Bradycardia. EXAM: PORTABLE CHEST 1 VIEW COMPARISON:  03/21/2024 FINDINGS: The cardio pericardial silhouette is enlarged. Vascular congestion with interstitial opacity suggests edema, similar to prior. Improved aeration in the lung bases bilaterally with some residual basilar atelectasis and probable tiny bilateral pleural effusions. Telemetry leads overlie the chest. IMPRESSION: Interval improvement in aeration in the lung bases with some residual basilar atelectasis and probable tiny bilateral pleural effusions. Electronically Signed   By: Camellia Candle  M.D.   On: 04/17/2024 11:11   DG Chest Portable 1 View Result Date: 03/21/2024 EXAM: 1 VIEW XRAY OF THE CHEST 03/21/2024 09:35:45 PM COMPARISON: 05/22/2022 CLINICAL HISTORY: Short of breath. c/o shortness of breath. Pt with hx of COPD, on 3L [place] at baseline, found with O2 sat 80% with significant work of breathing FINDINGS: LUNGS AND PLEURA: Central pulmonary vascular congestion. Interstitial opacity scattered throughout the mid and lower lungs. Small bilateral pleural effusions. Consolidation in the left lung base. No pneumothorax. HEART AND MEDIASTINUM: The heart is enlarged. BONES AND SOFT TISSUES: No acute osseous abnormality. IMPRESSION: 1. Small bilateral pleural effusions 2. Left basilar pneumonia 3. Cardiomegaly 4. Pulmonary edema pattern Electronically signed by: Greig Pique MD 03/21/2024 09:39 PM EDT RP Workstation: [id number]    ECHO ordered  TELEMETRY (personally reviewed): Complete heart block rate 20s  EKG (personally reviewed): Complete heart block rate 20 bpm  Data reviewed by me 04/19/2024: last 24h vitals tele labs imaging I/O ED provider note, admission H&P, hospitalist progress note  Principal Problem:   Complete heart block (HCC) Active Problems:   Hypothyroidism   Chronic obstructive pulmonary disease (HCC)   Stage 3a chronic kidney disease (CKD) (HCC)   Chronic systolic heart failure (HCC)   Essential hypertension   Type 2 diabetes mellitus with hyperglycemia (HCC)    ASSESSMENT AND PLAN:  VERSIE FLEENER is a 88 y.o. male  with a past medical history of COPD, diastolic CHF, GERD, type 2 diabetes mellitus, hypertension, dyslipidemia and hypothyroidism who presented to the ED on 04/17/2024 for weakness and SOB. Found to be significantly bradycardia and EMS gave atropine. Cardiology was consulted for further evaluation.   # Complete heart block # S/p Micra AV 2 leadless pacemaker 04/18/2024 # Coronary artery disease # Hypertension Patient presented in complete  heart block with heart rates in the 20s.  Home Coreg  has been held.  Troponins mildly elevated and flat.  BNP elevated but he is without significant change in his chronic shortness of breath and is on his home O2 requirement.  S/p Micra placement 04/18/2024. Echo this admission with EF 60-65%, no WMAs, RV normal size and function, mild AS.  - R groin suture removed without complication. Site clean and dry with no evidence of bleeding, bruising, mass, or tenderness. Post-care instructions discussed with wife. - Resume home coreg  at 3.125 mg twice daily. Can uptitrate outpatient as needed.  - Continue home pravastatin  20 mg daily, aspirin  81 mg daily. - Continue home Lasix  40 mg daily. - Minimal and flat troponin most consistent with demand/supply mismatch and not ACS.  This patient's plan of care was discussed  and created with Dr. Ammon and he is in agreement.  Signed: Danita Bloch, PA-C  04/19/2024, 8:49 AM [facility] Cardiology
